# Patient Record
Sex: Male | Born: 1960 | Race: White | Hispanic: No | Marital: Married | Smoking: Current every day smoker
Health system: Southern US, Community
[De-identification: ages and names within clinical notes are randomized; demographics above are authoritative.]

## PROBLEM LIST (undated history)

## (undated) DIAGNOSIS — I2699 Other pulmonary embolism without acute cor pulmonale: Secondary | ICD-10-CM

## (undated) DIAGNOSIS — F1010 Alcohol abuse, uncomplicated: Secondary | ICD-10-CM

## (undated) DIAGNOSIS — IMO0001 Reserved for inherently not codable concepts without codable children: Secondary | ICD-10-CM

## (undated) DIAGNOSIS — I255 Ischemic cardiomyopathy: Secondary | ICD-10-CM

## (undated) DIAGNOSIS — I5022 Chronic systolic (congestive) heart failure: Secondary | ICD-10-CM

## (undated) DIAGNOSIS — I472 Ventricular tachycardia, unspecified: Secondary | ICD-10-CM

## (undated) DIAGNOSIS — I10 Essential (primary) hypertension: Secondary | ICD-10-CM

## (undated) DIAGNOSIS — I2510 Atherosclerotic heart disease of native coronary artery without angina pectoris: Secondary | ICD-10-CM

## (undated) DIAGNOSIS — Z95810 Presence of automatic (implantable) cardiac defibrillator: Secondary | ICD-10-CM

## (undated) DIAGNOSIS — G4733 Obstructive sleep apnea (adult) (pediatric): Secondary | ICD-10-CM

## (undated) DIAGNOSIS — T82198A Other mechanical complication of other cardiac electronic device, initial encounter: Secondary | ICD-10-CM

## (undated) DIAGNOSIS — I252 Old myocardial infarction: Secondary | ICD-10-CM

## (undated) DIAGNOSIS — M1990 Unspecified osteoarthritis, unspecified site: Secondary | ICD-10-CM

## (undated) DIAGNOSIS — I4729 Other ventricular tachycardia: Secondary | ICD-10-CM

## (undated) DIAGNOSIS — K219 Gastro-esophageal reflux disease without esophagitis: Secondary | ICD-10-CM

## (undated) DIAGNOSIS — E871 Hypo-osmolality and hyponatremia: Secondary | ICD-10-CM

## (undated) DIAGNOSIS — F32A Depression, unspecified: Secondary | ICD-10-CM

## (undated) DIAGNOSIS — E785 Hyperlipidemia, unspecified: Secondary | ICD-10-CM

## (undated) DIAGNOSIS — E669 Obesity, unspecified: Secondary | ICD-10-CM

## (undated) DIAGNOSIS — F329 Major depressive disorder, single episode, unspecified: Secondary | ICD-10-CM

## (undated) HISTORY — DX: Obesity, unspecified: E66.9

## (undated) HISTORY — DX: Ischemic cardiomyopathy: I25.5

## (undated) HISTORY — DX: Hyperlipidemia, unspecified: E78.5

## (undated) HISTORY — DX: Obstructive sleep apnea (adult) (pediatric): G47.33

## (undated) HISTORY — PX: KNEE SURGERY: SHX244

## (undated) HISTORY — DX: Other pulmonary embolism without acute cor pulmonale: I26.99

## (undated) HISTORY — DX: Atherosclerotic heart disease of native coronary artery without angina pectoris: I25.10

## (undated) HISTORY — DX: Other mechanical complication of other cardiac electronic device, initial encounter: T82.198A

## (undated) HISTORY — DX: Other ventricular tachycardia: I47.29

## (undated) HISTORY — DX: Presence of automatic (implantable) cardiac defibrillator: Z95.810

## (undated) HISTORY — DX: Ventricular tachycardia, unspecified: I47.20

## (undated) HISTORY — DX: Ventricular tachycardia: I47.2

## (undated) HISTORY — DX: Essential (primary) hypertension: I10

---

## 2000-04-26 ENCOUNTER — Ambulatory Visit (HOSPITAL_BASED_OUTPATIENT_CLINIC_OR_DEPARTMENT_OTHER): Admission: RE | Admit: 2000-04-26 | Discharge: 2000-04-27 | Payer: Self-pay | Admitting: Orthopedic Surgery

## 2002-02-04 ENCOUNTER — Encounter: Payer: Self-pay | Admitting: Gastroenterology

## 2002-02-04 ENCOUNTER — Ambulatory Visit (HOSPITAL_COMMUNITY): Admission: RE | Admit: 2002-02-04 | Discharge: 2002-02-04 | Payer: Self-pay | Admitting: Gastroenterology

## 2002-02-16 ENCOUNTER — Emergency Department (HOSPITAL_COMMUNITY): Admission: EM | Admit: 2002-02-16 | Discharge: 2002-02-16 | Payer: Self-pay | Admitting: *Deleted

## 2002-02-16 ENCOUNTER — Encounter: Payer: Self-pay | Admitting: Emergency Medicine

## 2003-11-04 ENCOUNTER — Ambulatory Visit (HOSPITAL_COMMUNITY): Admission: RE | Admit: 2003-11-04 | Discharge: 2003-11-04 | Payer: Self-pay | Admitting: Gastroenterology

## 2004-02-19 ENCOUNTER — Emergency Department (HOSPITAL_COMMUNITY): Admission: EM | Admit: 2004-02-19 | Discharge: 2004-02-19 | Payer: Self-pay | Admitting: Emergency Medicine

## 2004-11-06 DIAGNOSIS — Z95810 Presence of automatic (implantable) cardiac defibrillator: Secondary | ICD-10-CM

## 2004-11-06 HISTORY — PX: CARDIAC DEFIBRILLATOR PLACEMENT: SHX171

## 2004-11-06 HISTORY — DX: Presence of automatic (implantable) cardiac defibrillator: Z95.810

## 2005-03-04 ENCOUNTER — Ambulatory Visit: Payer: Self-pay | Admitting: Cardiology

## 2005-03-05 ENCOUNTER — Inpatient Hospital Stay (HOSPITAL_COMMUNITY): Admission: EM | Admit: 2005-03-05 | Discharge: 2005-03-17 | Payer: Self-pay | Admitting: Cardiovascular Disease

## 2005-03-05 ENCOUNTER — Ambulatory Visit: Payer: Self-pay | Admitting: Internal Medicine

## 2005-03-05 ENCOUNTER — Ambulatory Visit: Payer: Self-pay | Admitting: Physical Medicine & Rehabilitation

## 2005-03-06 ENCOUNTER — Ambulatory Visit: Payer: Self-pay | Admitting: Pulmonary Disease

## 2005-04-05 ENCOUNTER — Ambulatory Visit: Payer: Self-pay | Admitting: Cardiology

## 2005-04-11 ENCOUNTER — Ambulatory Visit: Payer: Self-pay | Admitting: Cardiology

## 2005-04-12 ENCOUNTER — Ambulatory Visit: Payer: Self-pay | Admitting: Cardiology

## 2005-04-22 ENCOUNTER — Ambulatory Visit: Payer: Self-pay | Admitting: Cardiology

## 2005-04-23 ENCOUNTER — Inpatient Hospital Stay (HOSPITAL_COMMUNITY): Admission: EM | Admit: 2005-04-23 | Discharge: 2005-04-25 | Payer: Self-pay | Admitting: Emergency Medicine

## 2005-05-02 ENCOUNTER — Ambulatory Visit: Payer: Self-pay | Admitting: Cardiology

## 2005-06-08 ENCOUNTER — Ambulatory Visit: Payer: Self-pay | Admitting: Cardiology

## 2005-06-14 ENCOUNTER — Ambulatory Visit: Payer: Self-pay | Admitting: Cardiology

## 2005-06-23 ENCOUNTER — Ambulatory Visit: Payer: Self-pay | Admitting: Cardiology

## 2005-06-23 ENCOUNTER — Inpatient Hospital Stay (HOSPITAL_COMMUNITY): Admission: AD | Admit: 2005-06-23 | Discharge: 2005-06-26 | Payer: Self-pay | Admitting: Cardiology

## 2005-07-03 ENCOUNTER — Ambulatory Visit: Payer: Self-pay | Admitting: Cardiology

## 2005-07-19 ENCOUNTER — Ambulatory Visit: Payer: Self-pay | Admitting: Internal Medicine

## 2005-08-08 ENCOUNTER — Encounter (INDEPENDENT_AMBULATORY_CARE_PROVIDER_SITE_OTHER): Payer: Self-pay | Admitting: Internal Medicine

## 2005-08-08 ENCOUNTER — Ambulatory Visit (HOSPITAL_COMMUNITY): Admission: RE | Admit: 2005-08-08 | Discharge: 2005-08-08 | Payer: Self-pay | Admitting: Internal Medicine

## 2005-08-08 ENCOUNTER — Ambulatory Visit: Payer: Self-pay | Admitting: Internal Medicine

## 2005-08-14 ENCOUNTER — Ambulatory Visit (HOSPITAL_COMMUNITY): Admission: RE | Admit: 2005-08-14 | Discharge: 2005-08-14 | Payer: Self-pay | Admitting: Internal Medicine

## 2005-08-17 ENCOUNTER — Ambulatory Visit (HOSPITAL_COMMUNITY): Admission: RE | Admit: 2005-08-17 | Discharge: 2005-08-17 | Payer: Self-pay | Admitting: Cardiovascular Disease

## 2005-08-28 ENCOUNTER — Ambulatory Visit: Payer: Self-pay | Admitting: Internal Medicine

## 2005-08-28 ENCOUNTER — Observation Stay (HOSPITAL_COMMUNITY): Admission: AD | Admit: 2005-08-28 | Discharge: 2005-08-29 | Payer: Self-pay | Admitting: Internal Medicine

## 2005-09-13 ENCOUNTER — Ambulatory Visit: Payer: Self-pay

## 2005-10-10 ENCOUNTER — Ambulatory Visit: Payer: Self-pay | Admitting: Cardiology

## 2005-11-01 ENCOUNTER — Ambulatory Visit: Payer: Self-pay | Admitting: Cardiology

## 2005-11-08 ENCOUNTER — Ambulatory Visit: Payer: Self-pay | Admitting: Internal Medicine

## 2005-12-19 ENCOUNTER — Ambulatory Visit: Payer: Self-pay | Admitting: Internal Medicine

## 2006-01-02 ENCOUNTER — Ambulatory Visit: Payer: Self-pay | Admitting: Cardiology

## 2006-01-29 ENCOUNTER — Ambulatory Visit: Payer: Self-pay | Admitting: Cardiology

## 2006-03-02 ENCOUNTER — Ambulatory Visit: Payer: Self-pay | Admitting: Cardiology

## 2006-03-19 ENCOUNTER — Ambulatory Visit: Payer: Self-pay | Admitting: Internal Medicine

## 2006-03-20 ENCOUNTER — Ambulatory Visit: Payer: Self-pay | Admitting: Internal Medicine

## 2006-04-03 ENCOUNTER — Ambulatory Visit: Payer: Self-pay | Admitting: Cardiology

## 2006-04-09 ENCOUNTER — Ambulatory Visit: Payer: Self-pay | Admitting: Cardiology

## 2006-04-26 ENCOUNTER — Ambulatory Visit: Payer: Self-pay | Admitting: Cardiology

## 2006-06-04 ENCOUNTER — Ambulatory Visit: Payer: Self-pay | Admitting: Cardiology

## 2006-12-03 ENCOUNTER — Ambulatory Visit: Payer: Self-pay

## 2006-12-03 ENCOUNTER — Ambulatory Visit: Payer: Self-pay | Admitting: Internal Medicine

## 2006-12-24 ENCOUNTER — Ambulatory Visit: Payer: Self-pay | Admitting: Cardiology

## 2007-03-25 ENCOUNTER — Encounter: Payer: Self-pay | Admitting: Cardiology

## 2007-05-03 ENCOUNTER — Ambulatory Visit: Payer: Self-pay | Admitting: Gastroenterology

## 2007-05-07 ENCOUNTER — Encounter: Payer: Self-pay | Admitting: Cardiology

## 2007-05-13 ENCOUNTER — Ambulatory Visit: Payer: Self-pay | Admitting: Gastroenterology

## 2007-05-13 ENCOUNTER — Ambulatory Visit (HOSPITAL_COMMUNITY): Admission: RE | Admit: 2007-05-13 | Discharge: 2007-05-13 | Payer: Self-pay | Admitting: Gastroenterology

## 2007-05-15 ENCOUNTER — Encounter: Payer: Self-pay | Admitting: Cardiology

## 2007-07-11 ENCOUNTER — Ambulatory Visit: Payer: Self-pay | Admitting: Gastroenterology

## 2007-07-24 ENCOUNTER — Encounter: Payer: Self-pay | Admitting: Cardiology

## 2007-07-24 ENCOUNTER — Ambulatory Visit: Payer: Self-pay | Admitting: Physician Assistant

## 2007-07-26 ENCOUNTER — Encounter: Payer: Self-pay | Admitting: Cardiology

## 2007-08-13 ENCOUNTER — Ambulatory Visit: Payer: Self-pay | Admitting: Cardiology

## 2007-08-15 ENCOUNTER — Ambulatory Visit: Payer: Self-pay | Admitting: Internal Medicine

## 2007-08-22 ENCOUNTER — Ambulatory Visit (HOSPITAL_COMMUNITY): Payer: Self-pay | Admitting: Psychology

## 2007-10-21 ENCOUNTER — Ambulatory Visit (HOSPITAL_COMMUNITY): Payer: Self-pay | Admitting: Psychology

## 2007-11-06 ENCOUNTER — Ambulatory Visit (HOSPITAL_COMMUNITY): Payer: Self-pay | Admitting: Psychology

## 2007-11-08 ENCOUNTER — Emergency Department (HOSPITAL_COMMUNITY): Admission: EM | Admit: 2007-11-08 | Discharge: 2007-11-09 | Payer: Self-pay | Admitting: Emergency Medicine

## 2007-11-19 ENCOUNTER — Ambulatory Visit: Payer: Self-pay | Admitting: Internal Medicine

## 2007-12-06 ENCOUNTER — Ambulatory Visit (HOSPITAL_COMMUNITY): Payer: Self-pay | Admitting: Psychology

## 2008-01-22 ENCOUNTER — Encounter: Payer: Self-pay | Admitting: Cardiology

## 2008-02-13 ENCOUNTER — Ambulatory Visit: Payer: Self-pay | Admitting: Internal Medicine

## 2008-03-25 ENCOUNTER — Ambulatory Visit (HOSPITAL_COMMUNITY): Payer: Self-pay | Admitting: Psychology

## 2008-04-14 ENCOUNTER — Ambulatory Visit (HOSPITAL_COMMUNITY): Payer: Self-pay | Admitting: Psychology

## 2008-05-20 ENCOUNTER — Ambulatory Visit (HOSPITAL_COMMUNITY): Payer: Self-pay | Admitting: Psychology

## 2008-09-01 ENCOUNTER — Ambulatory Visit: Payer: Self-pay | Admitting: Internal Medicine

## 2009-02-02 ENCOUNTER — Encounter: Payer: Self-pay | Admitting: Internal Medicine

## 2009-04-14 ENCOUNTER — Encounter: Payer: Self-pay | Admitting: Cardiology

## 2009-05-26 DIAGNOSIS — I10 Essential (primary) hypertension: Secondary | ICD-10-CM | POA: Insufficient documentation

## 2009-05-26 DIAGNOSIS — I5022 Chronic systolic (congestive) heart failure: Secondary | ICD-10-CM | POA: Insufficient documentation

## 2009-05-26 DIAGNOSIS — E663 Overweight: Secondary | ICD-10-CM | POA: Insufficient documentation

## 2009-05-26 DIAGNOSIS — I429 Cardiomyopathy, unspecified: Secondary | ICD-10-CM | POA: Insufficient documentation

## 2009-05-26 DIAGNOSIS — I2510 Atherosclerotic heart disease of native coronary artery without angina pectoris: Secondary | ICD-10-CM | POA: Insufficient documentation

## 2009-05-26 DIAGNOSIS — E785 Hyperlipidemia, unspecified: Secondary | ICD-10-CM | POA: Insufficient documentation

## 2009-06-15 ENCOUNTER — Ambulatory Visit: Payer: Self-pay | Admitting: Internal Medicine

## 2009-09-14 ENCOUNTER — Ambulatory Visit: Payer: Self-pay | Admitting: Internal Medicine

## 2009-09-20 ENCOUNTER — Telehealth: Payer: Self-pay | Admitting: Internal Medicine

## 2009-10-13 ENCOUNTER — Encounter: Payer: Self-pay | Admitting: Internal Medicine

## 2009-10-13 ENCOUNTER — Ambulatory Visit: Payer: Self-pay | Admitting: Cardiology

## 2009-10-14 ENCOUNTER — Inpatient Hospital Stay (HOSPITAL_COMMUNITY): Admission: EM | Admit: 2009-10-14 | Discharge: 2009-10-15 | Payer: Self-pay | Admitting: Emergency Medicine

## 2009-10-14 ENCOUNTER — Encounter: Payer: Self-pay | Admitting: Cardiology

## 2009-10-15 ENCOUNTER — Telehealth (INDEPENDENT_AMBULATORY_CARE_PROVIDER_SITE_OTHER): Payer: Self-pay | Admitting: *Deleted

## 2009-12-14 ENCOUNTER — Ambulatory Visit: Payer: Self-pay | Admitting: Internal Medicine

## 2009-12-15 ENCOUNTER — Encounter: Payer: Self-pay | Admitting: Internal Medicine

## 2009-12-31 ENCOUNTER — Encounter: Payer: Self-pay | Admitting: Internal Medicine

## 2010-03-14 ENCOUNTER — Encounter: Payer: Self-pay | Admitting: Internal Medicine

## 2010-03-15 ENCOUNTER — Telehealth (INDEPENDENT_AMBULATORY_CARE_PROVIDER_SITE_OTHER): Payer: Self-pay | Admitting: *Deleted

## 2010-03-15 ENCOUNTER — Ambulatory Visit: Payer: Self-pay | Admitting: Internal Medicine

## 2010-03-22 ENCOUNTER — Encounter: Payer: Self-pay | Admitting: Internal Medicine

## 2010-07-05 ENCOUNTER — Ambulatory Visit: Payer: Self-pay | Admitting: Internal Medicine

## 2010-10-06 ENCOUNTER — Ambulatory Visit: Payer: Self-pay | Admitting: Internal Medicine

## 2010-11-16 ENCOUNTER — Encounter (INDEPENDENT_AMBULATORY_CARE_PROVIDER_SITE_OTHER): Payer: Self-pay | Admitting: *Deleted

## 2010-12-08 NOTE — Letter (Signed)
Summary: Remote Device Check  [employer], Main Office  [address]   [place], [place] [address]   Phone: [phone]  Fax: [phone]     December 31, 2009 MRN: [id number]   Stephen [name] [address] [place], [place]  [address]   Dear [name],   Your remote transmission was recieved and reviewed by your physician.  All diagnostics were within normal limits for you.  __X___Your next transmission is scheduled for: Mar 15, 2010.  Please transmit at any time this day.  If you have a wireless device your transmission will be sent automatically.      Sincerely,  Proofreader

## 2010-12-08 NOTE — Cardiovascular Report (Signed)
Summary: Office Visit Remote   Office Visit Remote   Imported By: Roderic Ovens 03/23/2010 10:55:29  _____________________________________________________________________  External Attachment:    Type:   Image     Comment:   External Document

## 2010-12-08 NOTE — Assessment & Plan Note (Addendum)
Summary: defib check.mdt.amber  Medications Added WARFARIN SODIUM 5 MG TABS (WARFARIN SODIUM) UAD.  2.5 daily except Thursday 5 mg      Allergies Added: NKDA  Referring Provider:  Donnella Bi Primary Provider:  Ardeen Garland  CC:  defib check.  History of Present Illness: Stephen [name] is seen in followup for ICD implanted initially for primary prevention in the setting of ischemic heart disease.He has prior stenting  of his RCA.He has had appropriate therapy for VT./VF Iin July of 08. There've been no intercurrent episodes.   He has chronic stable shortness of breath and chronic stable nonexertional chest pain. He's had no peripheral edema.  He comes in today with a request from Dr. Mckinley Jewel for orthopedic clearance for possible arthroscopy.    Current Medications (verified): 1)  Crestor 5 Mg Tabs (Rosuvastatin Calcium) .... Once Daily 2)  Furosemide 20 Mg Tabs (Furosemide) .... Once Daily 3)  Enalapril Maleate 5 Mg Tabs (Enalapril Maleate) .... Take One Tablet Once Daily 4)  Omeprazole 40 Mg Cpdr (Omeprazole) .... Once Daily 5)  Diazepam 2 Mg Tabs (Diazepam) .... Take 2 Tabs Once Daily 6)  Bisoprolol Fumarate 5 Mg Tabs (Bisoprolol Fumarate) .... Once Daily 7)  Depakote Er 500 Mg Xr24h-Tab (Divalproex Sodium) .... Take 2 Once Daily 8)  Nasonex 50 Mcg/act Susp (Mometasone Furoate) .Marland Kitchen.. 1 Spray Each Nostril Once Daily As Needed 9)  Nitrostat 0.4 Mg Subl (Nitroglycerin) .... Dissolve One Tablet Under Tongue Every 5 Minutes As Needed Chest Pain.  May Repeat 3 Times 10)  Warfarin Sodium 5 Mg Tabs (Warfarin Sodium) .... Uad.  2.5 Daily Except Thursday 5 Mg  Allergies (verified): No Known Drug Allergies  Past History:  Past Medical History: Last updated: 06/12/2009 OVERWEIGHT/OBESITY (ICD-278.02) HYPERTENSION, UNSPECIFIED (ICD-401.9) HYPERLIPIDEMIA-MIXED (ICD-272.4) SYSTOLIC HEART FAILURE, CHRONIC (ICD-428.22) CAD, NATIVE VESSEL (ICD-414.01) ICD - IN SITU (ICD-V45.02)--Medtronic  Maximo 7232   2006 CARDIOMYOPATHY, ISCHEMIC (ICD-414.8) CHEST PAIN-UNSPECIFIED (ICD-786.50) Obstructive sleep apnea COPD  Past Surgical History: Last updated: 06/12/2009 ICD implantation-Medtronic Maximo 3875--6433  Social History: Last updated: 05/26/2009 Married  Tobacco Use - Yes. - 2 packs daily Alcohol Use - yes - socially Drug Use - yes - marijuana  Vital Signs:  Patient profile:   50 year old male Height:      72 inches Weight:      290 pounds BMI:     39.47 Pulse rate:   86 / minute Pulse rhythm:   regular BP sitting:   148 / 109  (left arm) Cuff size:   regular  Vitals Entered By: Judithe Modest CMA (July 05, 2010 10:37 AM)  Physical Exam  General:  The patient was alert and oriented in no acute distress. HEENT Normal.  Neck veins were flat, carotids were brisk.  Lungs were clear.  Heart sounds were regular without murmurs or gallops.  Abdomen was soft with active bowel sounds. There is no clubbing cyanosis; trace edema Skin Warm and dry     ICD Specifications Following MD:  Sherryl Manges, MD     ICD Vendor:  Medtronic     ICD Model Number:  7232     ICD Serial Number:  [id number] ICD DOI:  08/28/2005     ICD Implanting MD:  Sherryl Manges, MD  Lead 1:    Location: RV     DOI: 08/28/2005     Model #: 6063     Serial #: [id number]     Status: active  Indications::  ICM   ICD Follow  Up Remote Check?  No Battery Voltage:  3.06 V     Charge Time:  8.56 seconds     Underlying rhythm:  SR ICD Dependent:  No       ICD Device Measurements Right Ventricle:  Amplitude: 12.5 mV, Impedance: 456 ohms, Threshold: 1.0 V at 0.2 msec Shock Impedance: 45 ohms   Episodes Shock:  0     ATP:  0     Nonsustained:  1     Ventricular Pacing:  <0.1%  Brady Parameters Mode VVI     Lower Rate Limit:  40      Tachy Zones VF:  214     VT:  250     VT1:  188     Next Remote Date:  10/06/2010     Next Cardiology Appt Due:  06/07/2011 Tech Comments:  No parameter changes.   Device function normal.  6949 lead stable,  SIC 0.  [facility]link transmissions every 3 months.  ROV 1 year with Dr. Graciela Husbands. Altha Harm, LPN  July 05, 2010 10:52 AM   Impression & Recommendations:  Problem # 1:  [facility] [phone] LEAD  LIA ON (ICD-996.04) we have reviewed again the importance of this 6949 alert. We have given him the letter and the magnet instructions. He verbalizes understanding.  Problem # 2:  CARDIOMYOPATHY, ISCHEMIC (ICD-414.8) he had no intercurrent problems with chest pain. in the event that he would like to pursue arthroscopy, I told him that he would need to undergo Myoview scanning as it has been more than 5 years since his last catheterization without intercurrent scan. The following medications were removed from the medication list:    Aspirin 81 Mg Tabs (Aspirin) ..... Once daily His updated medication list for this problem includes:    Furosemide 20 Mg Tabs (Furosemide) ..... Once daily    Enalapril Maleate 5 Mg Tabs (Enalapril maleate) .Marland Kitchen... Take one tablet once daily    Bisoprolol Fumarate 5 Mg Tabs (Bisoprolol fumarate) ..... Once daily    Nitrostat 0.4 Mg Subl (Nitroglycerin) .Marland Kitchen... Dissolve one tablet under tongue every 5 minutes as needed chest pain.  may repeat 3 times    Warfarin Sodium 5 Mg Tabs (Warfarin sodium) ..... Uad.  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 Once daily His updated medication list for this problem includes:    Enalapril Maleate 5 Mg Tabs (Enalapril maleate) .Marland Kitchen... Take one tablet once  daily    Bisoprolol Fumarate 5 Mg Tabs (Bisoprolol fumarate) ..... Once daily    Nitrostat 0.4 Mg Subl (Nitroglycerin) .Marland Kitchen... Dissolve one tablet under tongue every 5 minutes as needed chest pain.  may repeat 3 times    Warfarin Sodium 5 Mg Tabs (Warfarin sodium) ..... Uad.  2.5 daily except thursday 5 mg  Patient Instructions: 1)  Your physician recommends that you schedule a follow-up appointment in: YEAR WITH DR Graciela Husbands 2)  Your physician recommends that you continue on your current medications as directed. Please refer to the Current Medication list given to you today.  Appended Document: defib check.mdt.amber pts device interrogation  demonstrated fast haeart rate/  please have him increase bisoprolol to 10 from 5 thanks steve  Appended Document: defib check.mdt.amber PT AWARE./CY

## 2010-12-08 NOTE — Cardiovascular Report (Signed)
Summary: Office Visit Remote   Office Visit Remote   Imported By: Roderic Ovens 11/18/2010 11:26:25  _____________________________________________________________________  External Attachment:    Type:   Image     Comment:   External Document

## 2010-12-08 NOTE — Cardiovascular Report (Signed)
Summary: Office Visit Remote   Office Visit Remote   Imported By: Roderic Ovens 12/31/2009 13:53:35  _____________________________________________________________________  External Attachment:    Type:   Image     Comment:   External Document

## 2010-12-08 NOTE — Progress Notes (Signed)
Summary: Pt checking on transmission   Phone Note Call from Patient Call back at Home Phone [phone]   Caller: Patient Summary of Call: Pt calling to check and see if transmission went through Initial call taken by: Judie Grieve,  Mar 15, 2010 3:26 PM  Follow-up for Phone Call        left message for the patient, his transmissions did go through and he'll recieve a letter after it's read by the MD. Follow-up by: Altha Harm, LPN,  Mar 16, 2010 8:11 AM

## 2010-12-08 NOTE — Letter (Signed)
Summary: Remote Device Check  [facility] [employer], Main Office  [address]   [place], [place] [address]   Phone: [phone]  Fax: [phone]     November 16, 2010 MRN: [id number]   KINGSLEE MAIRENA [address] [place], [place]  [address]   Dear Mr. DOLLINS,   Your remote transmission was recieved and reviewed by your physician.  All diagnostics were within normal limits for you.  __X___Your next transmission is scheduled for:   01-05-2011.  Please transmit at any time this day.  If you have a wireless device your transmission will be sent automatically.   Sincerely,  Vella Kohler

## 2010-12-08 NOTE — Letter (Signed)
Summary: Remote Device Check  [facility], Main Office  [address]   [place], [place] [address]   Phone: [phone]  Fax: [phone]     Mar 22, 2010 MRN: [id number]   Stephen [name] [address] [place], [place]  [address]   Dear [name],   Your remote transmission was recieved and reviewed by your physician.  All diagnostics were within normal limits for you.  __X____Your next office visit is scheduled for:  August 2011. Please call our office to schedule an appointment.    Sincerely,  Vella Kohler

## 2011-01-05 ENCOUNTER — Encounter: Payer: Self-pay | Admitting: Internal Medicine

## 2011-01-05 ENCOUNTER — Encounter (INDEPENDENT_AMBULATORY_CARE_PROVIDER_SITE_OTHER): Payer: Medicare Other

## 2011-01-05 DIAGNOSIS — I428 Other cardiomyopathies: Secondary | ICD-10-CM

## 2011-01-24 NOTE — Cardiovascular Report (Signed)
Summary: Office Visit   Office Visit   Imported By: Roderic Ovens 01/17/2011 16:07:09  _____________________________________________________________________  External Attachment:    Type:   Image     Comment:   External Document

## 2011-02-07 LAB — CBC
HCT: 37.3 % — ABNORMAL LOW (ref 39.0–52.0)
MCHC: 32.1 g/dL (ref 30.0–36.0)
MCV: 79.1 fL (ref 78.0–100.0)
MCV: 79.3 fL (ref 78.0–100.0)
Platelets: 276 10*3/uL (ref 150–400)
RBC: 4.59 MIL/uL (ref 4.22–5.81)
RBC: 4.71 MIL/uL (ref 4.22–5.81)
RDW: 17.7 % — ABNORMAL HIGH (ref 11.5–15.5)
WBC: 12.2 10*3/uL — ABNORMAL HIGH (ref 4.0–10.5)
WBC: 12.4 10*3/uL — ABNORMAL HIGH (ref 4.0–10.5)

## 2011-02-07 LAB — HEPATIC FUNCTION PANEL
ALT: 27 U/L (ref 0–53)
AST: 33 U/L (ref 0–37)
Albumin: 3.9 g/dL (ref 3.5–5.2)
Alkaline Phosphatase: 77 U/L (ref 39–117)
Alkaline Phosphatase: 78 U/L (ref 39–117)
Bilirubin, Direct: 0.1 mg/dL (ref 0.0–0.3)
Bilirubin, Direct: 0.2 mg/dL (ref 0.0–0.3)
Indirect Bilirubin: 0.6 mg/dL (ref 0.3–0.9)
Indirect Bilirubin: 0.6 mg/dL (ref 0.3–0.9)
Total Bilirubin: 0.8 mg/dL (ref 0.3–1.2)
Total Protein: 7.4 g/dL (ref 6.0–8.3)

## 2011-02-07 LAB — BASIC METABOLIC PANEL
BUN: 11 mg/dL (ref 6–23)
Calcium: 9.1 mg/dL (ref 8.4–10.5)
Chloride: 97 mEq/L (ref 96–112)
Creatinine, Ser: 1.09 mg/dL (ref 0.4–1.5)
GFR calc Af Amer: >60 mL/min (ref >60)
Potassium: 3.8 mEq/L (ref 3.5–5.1)
Potassium: 3.9 mEq/L (ref 3.5–5.1)
Sodium: 136 mEq/L (ref 135–145)

## 2011-02-07 LAB — HEMOCCULT GUIAC POC 1CARD (OFFICE): Fecal Occult Bld: NEGATIVE

## 2011-02-07 LAB — DIFFERENTIAL
Basophils Absolute: 0.1 10*3/uL (ref 0.0–0.1)
Basophils Relative: 1 % (ref 0–1)
Eosinophils Relative: 3 % (ref 0–5)
Lymphocytes Relative: 17 % (ref 12–46)
Lymphocytes Relative: 23 % (ref 12–46)
Monocytes Absolute: 1.2 10*3/uL — ABNORMAL HIGH (ref 0.1–1.0)
Monocytes Relative: 13 % — ABNORMAL HIGH (ref 3–12)
Neutro Abs: 7.4 10*3/uL (ref 1.7–7.7)
Neutrophils Relative %: 70 % (ref 43–77)

## 2011-02-07 LAB — ANTIPHOSPHOLIPID SYNDROME EVAL, BLD
DRVVT: 46.1 secs — ABNORMAL HIGH (ref 34.7–40.5)
Drvvt confirmation: 1.16 Ratio (ref <1.18)
Lupus Anticoagulant: NOT DETECTED
PTTLA 4:1 Mix: 48.8 secs (ref 36.3–48.8)
dRVVT Incubated 1:1 Mix: 41.1 secs (ref 36.1–47.0)

## 2011-02-07 LAB — PROTIME-INR
INR: 1.08 (ref 0.00–1.49)
Prothrombin Time: 13.8 seconds (ref 11.6–15.2)
Prothrombin Time: 16.2 seconds — ABNORMAL HIGH (ref 11.6–15.2)

## 2011-02-07 LAB — CARDIAC PANEL(CRET KIN+CKTOT+MB+TROPI)
Relative Index: 1.4 (ref 0.0–2.5)
Total CK: 170 U/L (ref 7–232)
Troponin I: 0.02 ng/mL (ref 0.00–0.06)

## 2011-02-07 LAB — LIPID PANEL
Cholesterol: 114 mg/dL (ref 0–200)
LDL Cholesterol: 32 mg/dL (ref 0–99)
Total CHOL/HDL Ratio: 2.2 RATIO
Triglycerides: 154 mg/dL — ABNORMAL HIGH (ref <150)
VLDL: 31 mg/dL (ref 0–40)

## 2011-02-07 LAB — IRON AND TIBC
Saturation Ratios: 22 % (ref 20–55)
TIBC: 496 ug/dL — ABNORMAL HIGH (ref 215–435)

## 2011-02-07 LAB — POCT CARDIAC MARKERS
Myoglobin, poc: 159 ng/mL (ref 12–200)
Troponin i, poc: <0.05 ng/mL (ref 0.00–0.09)

## 2011-02-07 LAB — D-DIMER, QUANTITATIVE: D-Dimer, Quant: 0.57 ug/mL-FEU — ABNORMAL HIGH (ref 0.00–0.48)

## 2011-02-07 LAB — APTT: aPTT: 28 seconds (ref 24–37)

## 2011-02-07 LAB — PROTEIN S, TOTAL: Protein S Ag, Total: 117 % (ref 70–140)

## 2011-03-21 NOTE — Assessment & Plan Note (Signed)
NAME:  TRAYDEN, BRANDY                CHART#:  [id number]   DATE:  07/11/2007                       DOB:  08/09/1961   CHIEF COMPLAINT:  Follow up colonoscopy/refill on omeprazole/chronic  GERD.   SUBJECTIVE:  The patient is a 50 year old Caucasian male who underwent  colonoscopy by Dr. Kassie Mends on 05/13/07.  He was found to have a  tortuous colon which would not allow successful intubation of the cecum  after multiple attempts at repositioning pressure and straightening and  advancing the scope.  He had rare sigmoid diverticulosis and external  hemorrhoids.  He was scheduled to have a colonoscopy in 3-6 months with  an over tube and fluoroscopy due to the fact that it was felt he may  have a sessile cecal polyp interfering with exam.  He was given Anusol  HC b.i.d. for 14 days for his rectal bleeding; this has resolved.  Today  he tells me his GERD is well controlled; he is on b.i.d. omeprazole and  has been although our records indicate he was receiving once daily  dosage.  He tells me he has been on this for quite sometime.  He is no  longer on Reglan which we discontinued previously earlier this year.  He  complains of mask like facial pain along with severe headache, nasal  congestion he has had for about 4-5 days.  He denies any significant  fever.  He feels an intense pressure behind his eyes.  He has been  blowing his nose with yellowish to clear exudates.  He is requesting  antibiotics today.  His appointment for [facility] is in December of this year.   CURRENT MEDICATIONS:  See the list from 07/11/07.   ALLERGIES:  HALDOL, ATIVAN, AND HE HAS HAD A REACTION TO SOME SURGICAL  MEDICATIONS BUT DOES NOT GIVE DETAIL ON THIS.  HE IS ALSO ALLERGIC TO  OXYCONTIN.   OBJECTIVE:  VITAL SIGNS:  Weight 283 pounds, height 72 inches,  temperature 98.1, blood pressure 120/82, and pulse 88.  GENERAL:  The patient is an morbidly obese Caucasian male  who is alert,  oriented, pleasant, and cooperative, in no acute distress.  HEENT:  Sclerae clear, nonicteric, conjunctivae pink.  He has frontal  and maxillary sinus tenderness as well as ethmoid tenderness.  Nasal  turbinates with erythema.  Posterior pharynx with erythema and without  exudates or edema.  NECK:  Supple without any masses or thyromegaly.  CHEST:  Heart regular rate and rhythm, normal S1, S2.  ABDOMEN:  Protuberant with positive bowel sounds times 4.  No bruits  auscultated.  Soft, nontender, nondistended without palpable mass or  hepatosplenomegaly.  No rebound, tenderness or guarding although limited  given the patient's body habitus.  EXTREMITIES:  Without clubbing or edema bilaterally.   ASSESSMENT:  1. The patient is a 50 year old Caucasian male with what appears to be      acute sinusitis.  Due to the fact that he has been examined today      and tells me it would be difficult to get an appointment with Dr.      Kathi Der office today will go ahead and treat him for a sinusitis.      He is however instructed that if his symptoms persist he is going  to need to follow up with Dr. Christell Constant as well as if he has any      problems.  2. His chronic GERD is well controlled on b.i.d. PPI.  3. Incomplete colonoscopy scheduled for complete colonoscopy at [facility] with over tube and      fluoroscopy in December 2008.  He also has a defibrillator.   PLAN:  1. Refill on omeprazole 20 mg b.i.d., #60 with 5 refills.  2. Z-Pak to take as directed.  He is to call Dr. Christell Constant if he has any      further problems with his sinusitis.  3. Follow up with [facility] in      December of this year.  4. Follow up with Dr. Cira Servant in January 2009 or sooner if he has any      problems.       Lorenza Burton, N.P.  Electronically Signed     Kassie Mends, M.D.  Electronically Signed    KJ/MEDQ  D:  07/11/2007   T:  07/11/2007  Job:  [id number]   cc:   Ernestina Penna, M.D.

## 2011-03-21 NOTE — H&P (Signed)
NAME:  Stephen [name], Stephen [name] NO.:  [id number]   MEDICAL RECORD NO.:  [id number]          PATIENT TYPE:  AMB   LOCATION:  DAY                           FACILITY:  [facility]   PHYSICIAN:  Kassie Mends, M.D.      DATE OF BIRTH:  [birth year]   DATE OF ADMISSION:  05/03/2007  DATE OF DISCHARGE:  LH                              HISTORY & PHYSICAL   CHIEF COMPLAINT:  Hematochezia/followup GERD.   HISTORY OF PRESENT ILLNESS:  Stephen [name] is a 50 year old Caucasian male  with a history of chronic GERD.  He is here for followup today.  He  tells me he is doing well on omeprazole 20 mg daily.  He discontinued  his Reglan, which he was taking 2.5 mg q.i.d.  He discontinued it about  one or two weeks ago.  He has done well off of this medication.  His  weight is steadily increasing.  He denies nausea, vomiting, heartburn,  or indigestion.  He only has rare transient epigastric pain.  He tells  me within the last 3-4 months, he has noticed moderate to large amounts  of bright red blood on the toilet paper with wiping.  He denies any  history of diarrhea or constipation.  He is having some proctalgia.  He  has been self-treating for hemorrhoids.  There is no known family  history of colorectal carcinoma.   PAST MEDICAL/SURGICAL HISTORY:  1. Coronary disease, status post MI in April 2006, status post PTCA.  2. Left knee replacement, 2001.  3. He has chronic GERD with EGD by Dr. Karilyn Cota on August 08, 2005.      There was no evidence of complicated GERD.  Bravo device was placed      at that time.  He had a small antral polyp and antral gastritis.      He has a small antral fundic gland polyp and antral gastritis.  4. Ischemic cardiomyopathy with a decreased ejection fraction.  He had      a defibrillator placed back in 2006.  5. He has depression.  6. Congestive heart failure.  7. Hyperlipidemia.  8. COPD.  9. Obesity.  10.Obstructive sleep apnea.   CURRENT MEDICATIONS:  1. Aspirin 325  mg daily.  2. Plavix 75 mg daily.  3. Lasix 20 mg daily.  4. Enalapril 5 mg b.i.d.  5. Crestor once every other day.   ALLERGIES:  1. HALDOL.  2. ATIVAN.  3. OXYCONTIN.  He has had a reaction to some medicines he was given after surgery but  does not have details on this.   FAMILY HISTORY:  Noncontributory.   SOCIAL HISTORY:  Stephen [name] is married.  He has two healthy children.  He has a  significant tobacco use history.  He does have history of frequent  alcohol use and remote marijuana use.  No recent drug use.   REVIEW OF SYSTEMS:  See HPI, otherwise unremarkable.   PHYSICAL EXAMINATION:  VITAL SIGNS:  Weight 282 pounds, height 72  inches, temperature 98 degrees, blood pressure 120/82, pulse 72.  GENERAL:  Stephen [name]  is a 52 year old, obese, Caucasian male who is  alert, oriented, pleasant, cooperative in no acute distress.  HEENT: clear, nonicteric.  Conjunctivae pink.  Oropharynx pink and moist  without lesions.  HEART:  Regular rate and rhythm, normal S1 S2 without murmurs, clicks,  rubs, or gallops.  He has a defibrillator in place.  LUNGS:  Clear to auscultation bilaterally.  ABDOMEN:  Positive bowel sounds x4.  No bruits auscultated.  Soft,  nontender, nondistended without palpable mass, hepatosplenomegaly.  No  rebound tenderness or guarding.  EXTREMITIES:  Exam is limited given the patient's body habitus.  RECTAL:  No external lesions visualized.  Good sphincter tone.  No  internal mass.  A small amount of Hemoccult negative Janeway stool  obtained from vault.  He had significant tenderness on exam, although no  lesions were palpated.   IMPRESSION:  1. Stephen [name] is a 56 year old Caucasian male with history of chronic      gastroesophageal reflux disease, well-controlled on PPI.  2. He also has noted intermittent large to moderate volume      hematochezia on the toilet paper, which I suspect could be related      to fissure/internal hemorrhoids not appreciated on  exam,      diverticula, or colorectal carcinoma.   PLAN:  1. We will request recent labs from Dr. Kathi Der office.  2. Omeprazole 20 mg daily, #30, with 11 refills.  3. Suggested significant weight loss.  4. Colonoscopy with Dr. Cira Servant in the near future.  I have discussed      this procedure including risks and benefits which include, but are      not limited to bleeding, infection, perforation, drug reaction.  He      agrees with plan.  Consent will be obtained.  He is to hold his      Plavix and aspirin for four days prior to the procedure.      Lorenza Burton, N.P.      Kassie Mends, M.D.  Electronically Signed   KJ/MEDQ  D:  05/03/2007  T:  05/03/2007  Job:  [id number]

## 2011-03-21 NOTE — Assessment & Plan Note (Signed)
[facility]                          [facility] OFFICE NOTE   Stephen [name], Stephen [name]                      MRN:          [id number]  DATE:07/24/2007                            DOB:          08/15/1961    REFERRING PHYSICIAN:  Dr. Morrie Sheldon and Dr. Daphine Deutscher at the [facility].   HISTORY OF PRESENT ILLNESS:  The patient is a 50 year old male with a  history of coronary artery disease status post inferior wall myocardial  infarction treated with overlapping tandem bare-metal stents in April of  2006. The patient had a followup catheterization with nonobstructive  disease. The ejection fraction was 40-45%. The patient had not had any  recent stress testing done. He has had no heart failure symptoms. He  does report atypical chest pain across the precordium which he started  after his defibrillator implant. On physical exam, however, there is no  obvious problems related to his defibrillator. There is no swelling or  erythema in particular. He follows with Dr. Graciela Husbands, and Dr. Graciela Husbands is to  see him next week.   From a cardiovascular standpoint, the patient is actually doing quite  well. He has no chest pain, shortness of breath, orthopnea, PND.  Unfortunately, he has gained weight, and he does not adhere well to a  low-carbohydrate diet. The patient also looks rather anxious and upset  today because he had to wait slightly longer in the office today. His  blood pressure was elevated, but even after we kept the patient for a  little while in the office, his blood pressure, particularly diastolic,  remained high. I told him that he has worsening hypertension that needs  our attention and treatment.   MEDICATIONS:  1. Aspirin 325 mg p.o. daily.  2. Enalapril 2.5 mg p.o. b.i.d.  3. Lasix 20 mg p.o. daily.  4. Crestor 10 mg every other day.  5. Bisoprolol 5 mg p.o. daily.   PHYSICAL EXAMINATION:  VITAL SIGNS:  Blood pressure is 131/89 and  137/92  on repeat. Heart rate is 82, weight is 278 pounds.  GENERAL:  Overweight white male in no distress.  HEENT:  Normal.  NECK EXAM:  Normal carotid upstroke. Normal carotid bruits.  LUNGS:  Clear breath sounds bilaterally. No wheezing.  HEART:  Regular rate and rhythm. Normal S1 and S2. No murmurs, rubs, or  gallops.  ABDOMEN:  Soft, nontender. No rebound or guarding. Good bowel sounds.  EXTREMITY EXAM:  No cyanosis, clubbing or edema.  NEUROLOGICAL:  The patient is alert and oriented and grossly nonfocal.   PROBLEM LIST:  1. Coronary artery disease with ischemic cardiomyopathy.      a.     Ejection fraction 40-45% on echocardiogram June 2007.      b.     Status post __________ MI with overlapping and tandem bare-       metal stents April 2006.      c.     Patent stent with residual nonobstructive disease August       2006.      d.     History  of ventricular fibrillation associated with anterior       wall myocardial infarction April 2006.      e.     Atypical chest pain.  2. Status post Medtronic single chamber implantable cardioverter      defibrillator by Dr. Graciela Husbands October 2006.  3. Systolic heart failure, [place] Heart Association Class II.  4. Obstructive sleep apnea on CPAP.  5. Chronic obstructive pulmonary disease.  6. Dyslipidemia.  7. Hypertension.  8. History of alcohol abuse.   PLAN:  1. I have told the patient that this chest pain is atypical. I do not      think that this is ischemia, but he has not had a stress test in      some time. We will plan on adenosine Cardiolite in the next couple      of days.  2. I have told the patient also that if his blood pressure remains      elevated he may need aggressive treatment for this. I have      increased his enalapril to 5 mg p.o. b.i.d.  3. I have also given the patient guidelines from up to date regarding      dietary modification, both from the perspective of      hypercholesterolemia, hypertension and the  fact that he is      overweight.  4. The patient had lipid panel drawn at [facility] and will follow up with this practice in regards to      adjustments of his statin drug therapy.     Learta Codding, MD,FACC  Electronically Signed    GED/MedQ  DD: 07/24/2007  DT: 07/24/2007  Job #: [id number]   cc:   Alfredia Client, MD  Paulene Floor, NP

## 2011-03-21 NOTE — Op Note (Signed)
NAME:  Stephen [name], Stephen [name] NO.:  [id number]   MEDICAL RECORD NO.:  [id number]          PATIENT TYPE:  AMB   LOCATION:  DAY                           FACILITY:  [facility]   PHYSICIAN:  Kassie Mends, M.D.      DATE OF BIRTH:  [birth year]   DATE OF PROCEDURE:  05/13/2007  DATE OF DISCHARGE:                               OPERATIVE REPORT   PROCEDURE:  Colonoscopy.   INDICATIONS FOR PROCEDURE:  Stephen [name] is a 50 year old male who presents  with rectal bleeding.   FINDINGS:  1. Tortuous colon which would not allow successful intubation of the      cecum after multiple attempts at repositioning, pressure and      straightening and advancing the scope.  Otherwise, no polyps,      masses, inflammatory changes or AVM's seen.  2. Rare sigmoid diverticulosis.  3. Normal retroflexed view of the rectum.  External hemorrhoids seen      on rectal exam.   RECOMMENDATIONS:  1. Stephen [name] should have a colonoscopy in 3-6 months at [facility] with an overtube and fluoroscopy.  I believe he has a      sessile cecal polyp.  2. He should follow a high fiber diet.  He is given a handout on high      fiber diet, diverticulosis and hemorrhoids.  3. He may resume his aspirin and his Plavix until his colonoscopy is      scheduled.  4. Anusol HC pr BID for 14 days. OPV with [facility] in one month to      reassess his rectal bleeding.   MEDICATIONS:  1. Demerol 100 mg IV.  2. Versed 9 mg IV.   PROCEDURE TECHNIQUE:  Physical examination was performed and informed  consent was obtained from this patient after explaining the benefits,  risks and alternatives to the procedure.  A magnet was applied to his  defibrillator per the recommendations of Stephen [name].  The patient was  connected to the monitor and placed in the left lateral position.  Continuous oxygen was provided by nasal cannula and IV medicine  administered through an indwelling cannula.  After the administration of  sedation and rectal exam the scope was advanced under direct  visualization to the ileocecal valve.  The scope was removed slowly by  carefully examining the color, texture, anatomy and integrity of the  mucosa on the way out.  The patient was recovered in endoscopy and  discharged home in satisfactory condition.      Kassie Mends, M.D.  Electronically Signed     SM/MEDQ  D:  05/13/2007  T:  05/13/2007  Job:  [id number]   cc:   Ernestina Penna, M.D.  Fax: [phone]   Learta Codding, MD,FACC  [address]  [place], [place] [address]   Kassie Mends, M.D.  [address]  [place] , [place] [address]

## 2011-03-21 NOTE — Assessment & Plan Note (Signed)
[facility]                         ELECTROPHYSIOLOGY OFFICE NOTE   SULEIMAN, FINIGAN                      MRN:          [id number]  DATE:11/19/2007                            DOB:          02/22/1961    Mr. Stephen [name] is seen following ICD implantation for primary prevention in  the setting of ischemic heart disease.  He has had no intercurrent  episodes.  He does have problems with chronic pain.  He has seen Dr.  Andee Lineman and underwent stress testing in October, which was negative.   MEDICATIONS:  Include:  1. Aspirin 325.  2. Omeprazole.  3. Furosemide.  4. Enalapril 2.5.  5. Bisoprolol 5.  6. Xanax.   EXAMINATION:  VITAL SIGNS:  His blood pressure today was 135/91 with a  pulse of 74.  LUNGS:  His lungs were clear.  HEART:  Sounds were regular.  EXTREMITIES:  Without edema.   Interrogation of his Medtronic ICD demonstrates a R-wave of 13.2 with  impedance of 496, a threshold of 1 volt at 0.2.  High voltage  impedance  was 50/66.   IMPRESSION:  1. Ischemic heart disease.  2. Status post implantable cardiac defibrillator for the above.  3. Chronic chest pain without explanation.   He is to see Dr. Karilyn Cota.  Will see him again one year's time.  He will  be followed remotely in the interim.     Duke Salvia, MD, [facility]  Electronically Signed    Stephen [name]/MedQ  DD: 11/19/2007  DT: 11/19/2007  Job #: [id number]   cc:   Willeen Niece

## 2011-03-24 NOTE — Assessment & Plan Note (Signed)
[facility]                          [facility] [facility] OFFICE NOTE   DEONTRAY, HUNNICUTT                      MRN:          [id number]  DATE:12/24/2006                            DOB:          July 04, 1961    PRIMARY CARDIOLOGIST:  Learta Codding, M.D.   PRIMARY ELECTROPHYSIOLOGIST:  Duke Salvia, M.D.   REASON FOR OFFICE VISIT:  Scheduled 79-month followup.  Please refer to  Dr. Margarita Mail previous clinic note of June 2007 for full details.   Since last seen in the clinic, the patient reports no significant change  from his baseline level of exercise tolerance.  Of note, however, he  states that he can easily climb a full flight of stairs with no  associated chest pain or dyspnea.   Unfortunately, the patient continues to smoke, currently about 10  cigarettes a day, and states that he has tired several different  modalities in the past to no avail.  However, he states that he does  want to quit smoking.   The patient was seen in the [facility] pacemaker clinic approximately 3  weeks ago and he reports that he is due to follow up with Dr. Sherryl Manges in August of this year.   When last seen in the clinic, the patient was started on beta blocker  with low-dose bisoprolol as well as low-dose Lasix.  Most recent blood  work last December revealed normal electrolytes, renal function, and  CBC.  Liver profile notable for total cholesterol 105, triglyceride 193,  HDL 35, and LDL 31.  Liver enzymes were normal.   The patient reports no interim firing of his defibrillator.   CURRENT MEDICATIONS:  1. Aspirin 325 daily.  2. Plavix.  3. Prilosec 20 b.i.d.  4. Enalapril 2.5 b.i.d.  5. Paxil 30 daily.  6. Lasix 20 daily.  7. Lipitor 10 daily.  8. Bisoprolol 2.5 daily.  9. CPAP.   PHYSICAL EXAMINATION:  VITAL SIGNS:  Blood pressure 149/91, pulse 97 and  regular, weight 278.6 (up 16 pounds).  GENERAL:  A 50 year old male, obese, sitting upright in  no apparent  distress.  HEENT:  Normocephalic, atraumatic.  NECK:  Palpable bilateral carotid pulses without bruits; no JVD at 90  degrees.  LUNGS:  Diminished breath sounds in the bases, without crackles or  wheezes.  HEART:  Regular rate and rhythm (S1, S2).  No significant murmurs.  ABDOMEN:  Protuberant, nontender, with intact bowel sounds.  EXTREMITIES:  Palpable peripheral pulses with trace lower extremity  edema.  NEUROLOGIC:  No focal deficit.   IMPRESSION:  1. Ischemic cardiomyopathy.      a.     Ejection fraction 40-45% by 2-D echocardiogram June 2007.      b.     Status post acute inferior myocardial infarction/tandem bare-       metal stenting April 2006.      c.     Patent stents with residual nonobstructive coronary artery       disease; ejection fraction 35-40% by cardiac catheterization       August 2006.  d.     Status post ventricular fibrillation arrest associated with       anterior myocardial infarction April 2006.  2. Status post Medtronic (single-chamber) implantable cardioverter-      defibrillator implantation by Dr. Sherryl Manges October 2006.  3. Chronic systolic heart failure currently NYHA class II.  4. Obstructive sleep apnea/on CPAP.  5. Chronic obstructive pulmonary disease/ongoing tobacco.  6. Mixed dyslipidemia:  Hypertriglyceridemia/low HDL.  7. Hypertension.  8. History of alcohol abuse.   PLAN:  1. Increase bisoprolol to 5 mg daily for better blood pressure and      heart rate control.  2. Prescribe Chantix for assistance in smoking cessation.  3. Continue Plavix until April of this year, representing 2 years out      from his myocardial infarction treated with bare-metal stenting,      although the patient seems to recall that he had a drug-eluting      stent at that time.  The patient is to then continue on low-dose      aspirin indefinitely.  4. Substitute Lipitor with Crestor 5 mg daily, for better management      of low HDL.  The  patient will need a followup fasting lipid/liver      profile in 3 months.  5. Schedule return clinic followup with myself and Dr. Andee Lineman in 6      months.  6. The patient is to keep previously scheduled followup with Dr.      Sherryl Manges later this year.     Gene Serpe, PA-C  Electronically Signed      Learta Codding, MD,FACC  Electronically Signed   GS/MedQ  DD: 12/24/2006  DT: 12/25/2006  Job #: [id number]   cc:   Montey Hora

## 2011-03-24 NOTE — Consult Note (Signed)
NAME:  Stephen [name], Stephen [name] NO.:  [id number]   MEDICAL RECORD NO.:  [id number]          PATIENT TYPE:  AMB   LOCATION:                                FACILITY:  [facility]   PHYSICIAN:  Lionel December, M.D.    DATE OF BIRTH:  11-22-1960   DATE OF CONSULTATION:  DATE OF DISCHARGE:                                   CONSULTATION   REQUESTING PHYSICIAN:  Dr. Andee Lineman.   REASON FOR CONSULTATION:  Refractory GERD, chest pain.   HISTORY OF PRESENT ILLNESS:  Stephen [name] is a 50 year old Caucasian male who  presents today with complaints of severe acid reflux. He notes he had severe  chest pain back in December of 2002. He was diagnosed with GERD. He has  continued to have significant refractory type symptoms over the last several  years. He complains of continuous burning ache to his anterior chest. He  also complains of intermittent heartburn and indigestion which is made worse  with certain foods. He was on Nexium for about three years. He also takes  Reglan 2.5 mg q.i.d. More recently, he was tried on omeprazole 20 mg daily  which he states did not work very well. He has also tried Protonix 40 mg  daily which worked somewhat better although have not relieved his symptoms  completely. He did have an EGD back in 2004 by Dr. Madilyn Fireman in [place]. He  is unsure of the findings. He also complains of occasional solid food  dysphagia. He notes he has to drink several sips of liquids to get down  some solid foods. He denies any regurgitation or vomiting but does have  transient nausea. He notes bowel movements generally every day or every  other day. However, more recently, he has gone up to three days without a  bowel movement, having some abdominal bloating and flatulence. He denies any  rectal bleeding or melena.   PAST MEDICAL HISTORY:  1.  Coronary artery disease. MI in April of 2006 and status post angioplasty      with two stents placed.  2.  Left knee replacement in 2001.  3.  He  is currently on Amoxil for strep pharyngitis.   CURRENT MEDICATIONS:  1.  Aspirin 325 mg daily.  2.  Multivitamin daily.  3.  Reglan 2.5 mg q.i.d.  4.  Librax 10 mg q.i.d.  5.  Omeprazole 20 mg daily.  6.  Plavix 75 mg daily.  7.  Lipitor 10 mg daily.  8.  Toprol-XL 25 mg daily.  9.  Lasix 20 mg daily.  10. Enalapril 5 mg b.i.d.  11. Amoxicillin, completing 10 day course for strep pharyngitis.   ALLERGIES:  HALDOL, ATIVAN, and OXYCONTIN.   FAMILY HISTORY:  No known family history of colorectal carcinoma. Mother  deceased at age 38 secondary to cerebral aneurysm. He has one sister with  diabetes mellitus, one brother with CVA.   SOCIAL HISTORY:  Stephen [name] has been married for 24 years. He has two healthy  children. He is employed full time with the [employer]. He  has a  25+-pack-year tobacco history. He currently drinks sporadically;  however, he did have a history of drinking a couple of beers or some whisky  every evening. He notes he has not been doing this recently. Does have a  history of remote marijuana use. No recent drug use.   REVIEW OF SYSTEMS:  CONSTITUTIONAL:  Denies any fever or chills. Weight  steadily increasing. Denies any anorexia or early satiety. CARDIOVASCULAR:  See HPI. HEENT:  He is currently being treated for strep. PULMONARY:  Denies  any shortness of breath, dyspnea, cough, or hemoptysis. GASTROINTESTINAL:  See HPI.   PHYSICAL EXAMINATION:  VITAL SIGNS:  Weight 252.5 pounds, height 72 inches,  temperature 98 degrees, blood pressure 122/74, pulse 72.  GENERAL:  Stephen [name] is an obese, 7 year old Caucasian male who is alert,  oriented, pleasant, and cooperative in no acute distress.  HEENT:  Sclerae are clear, nonicteric. Conjunctivae are pink. Oropharynx  pink and moist without any lesions.  NECK:  Supple without any masses or thyromegaly.  CHEST:  Heart regular rate and rhythm with normal S1 and S2.  LUNGS:  Clear to auscultation  bilaterally.  ABDOMEN:  Protuberant with positive bowel sounds x4. No bruits auscultated.  Soft, nontender, nondistended without palpable mass or hepatosplenomegaly,  although liver edge is palpable 1 to 2 fingerbreadths below the right costal  margin. There is no rebound tenderness or guarding.  RECTAL:  Deferred.  EXTREMITIES:  Without edema or clubbing bilaterally.  SKIN:  Pink, warm and dry without any rash or jaundice.   IMPRESSION:  Stephen [name] is a 9 year old Caucasian male with a four-year  history of refractory GERD symptoms as well as persistent noncardiac chest  pain. I feel evaluation is necessary to rule out complicated  gastroesophageal reflux disease including development of Barrett's  esophagus. He also complains of intermittent solid-food dysphagia, and we  need to rule out esophageal web, ring, or stricture.   As far as his constipation is concerned. It seems to be transient over the  last couple of weeks. We will give a trial of MiraLax.   PLAN:  1.  We will schedule EGD with possible ED. Depending on findings, he may      also benefit from a placement of a Bravo study. We will ask him to hold      his aspirin for three days prior to the procedure.  2.  Fibersure samples given.  3.  Prescription was given for MiraLax 17 g daily p.r.n. constipation      ____________ refill Protonix 40 mg daily #30 with 0 refills - two weeks      of Protonix samples were given, Colace 100 mg 1 to 2 p.o. daily p.r.n.      constipation #60 with 1 refill, and recommended weight loss and      discussed appropriate dietary changes.  4.  Further recommendations to follow procedure.   We would like to thank Dr. Andee Lineman for allowing [place] to participate in the care  Stephen [name].      Nicholas Lose, N.P.      Lionel December, M.D.  Electronically Signed    KC/MEDQ  D:  07/19/2005  T:  07/19/2005  Job:  [id number]  cc:   Learta Codding, M.D. [facility]  [address]  [address]   [place]  [place] [address]

## 2011-03-24 NOTE — Discharge Summary (Signed)
NAME:  Stephen [name], Stephen [name] NO.:  [id number]   MEDICAL RECORD NO.:  [id number]          PATIENT TYPE:  INP   LOCATION:  3732                         FACILITY:  [facility]   PHYSICIAN:  Charlton Haws, M.D.     DATE OF BIRTH:  1961-07-19   DATE OF ADMISSION:  04/23/2005  DATE OF DISCHARGE:  04/25/2005                                 DISCHARGE SUMMARY   PRINCIPAL DIAGNOSES:  1.  Chest pain.  2.  Lightheadedness.  3.  Nausea.   OTHER DIAGNOSES:  1.  Coronary artery disease, status post inferior ST elevation MI in May of      2006, complicated by ventricular fibrillation arrest requiring multiple      shocks, intubation and primary stenting of the right coronary artery      with two bare metal stents.  2.  Gastroesophageal reflux disease.  3.  Status post methicillin-sensitive Staphylococcus aureus pneumonia.  4.  Mild anoxic brain injury.  5.  Tobacco abuse.   ALLERGIES:  No known drug allergies.   PROCEDURES:  1.  Left heart cardiac catheterization.  2.  Chest CT.   HISTORY OF PRESENT ILLNESS:  A 50 year old white male with  history of CAD  status post recent inferior ST elevation MI in May of 2006, which was  complicated by VF arrest requiring multiple shocks, intubation, and  subsequent primary stenting of right coronary artery with two bare metal  stents.  Post intervention, course was complicated by MSSA pneumonia as well  as mild anoxic brain injury.  He was discharged from the hospital in May.  He was doing relatively well, has been increasing his physical activity  gradually, exercising 20 to 30 minutes five times a week.  He is not  currently enrolled in cardiac rehab.  He had follow-up with Dr. Andee Lineman in  the office with functional study showing no inducible ischemia.  On April 23, 2005, he was working on his yard and developed lightheadedness, nausea and  chest pain which persisted for approximately 30 minutes requiring him to  take sublingual nitroglycerin  which did not relieve his pain.  He  subsequently called 911 and was taken to the [facility]  ED for further evaluation.  Patient ruled out for MI with two negative  cardiac enzymes.  He was admitted to the [facility] Cardiology Service.   HOSPITAL COURSE:  Following rule out, the patient was set up for left heart  cardiac catheterization  which took place on April 24, 2005, showing  insignificant coronary disease and patency of the previously placed RCA  stents.  EF was 35 to 40% with severe inferior akinesis.  Considering there  was no identifiable cause for his chest pain based on catheterization, he  underwent chest CT to rule out PE this morning which was negative.  The  patient did not have any further episodes of chest discomfort or  lightheadedness and being discharged home today in satisfactory condition.   DISCHARGE LABORATORY DATA:  Hemoglobin 12.5, hematocrit 38.3, wbc 7.0,  platelets 227.  Sodium 139, potassium 3.8, chloride 106,  CO2 23, BUN 13,  creatinine 1.0, glucose 117.  PT 13.4, INR 1.1.  Cardiac enzymes negative  x2.  Total cholesterol 89, triglycerides 139,  HDL 31, LDL 30.  Calcium 9.2.  BNP 81.8.   DISPOSITION:  Patient is being discharged home today in good condition.   FOLLOW UP APPOINTMENTS:  He has follow-up with Dr. Montey Hora in one to  two weeks and see Dr. Andee Lineman for groin check in two weeks.  The patient  should have a repeat chest CT in three months to be done in [facility]  Cardiology office to follow up lung nodule seen on CT during this admission.   OUTSTANDING LAB STUDIES:  None.   DISCHARGE MEDICATIONS:  1.  Aspirin 81 mg daily.  2.  Plavix 75 mg daily.  3.  Toprol XL 25 mg daily.  4.  Nexium 40 mg daily.  5.  Reglan 10 mg daily with meals.  6.  Lipitor 10 mg q.h.s.   DURATION OF DISCHARGE ENCOUNTER:  45 minutes.      Christop   CRB/MEDQ  D:  04/25/2005  T:  04/25/2005  Job:  [id number]

## 2011-03-24 NOTE — Procedures (Signed)
REFERRED BY:  Verne Grain, MD   EEG Number [phone].   HISTORY:  This is a[age] year old patient who is being evaluated for altered  mental status following a myocardial infarction.  This is a portable EEG  recording.  No skull defects are noted.   MEDICATIONS:  Not listed.   EEG CLASSIFICATION:  Dysrhythmic Grade 1, generalized.   DESCRIPTION OF THE RECORDING:  Background rhythm in this recording  consistent with moderately well modulated, medium amplitude 7 Hz background  activity that is reactive to eye opening and closure.  As the record  progresses, the mild background theta frequency slowing is seen throughout  the recording and relatively persistent.  Photic stimulation and  hyperventilation are not performed during this time.  At no time during the  recording did there appear evidence of active spike or spike wave discharges  or evidence of focal slowing. EKG monitor shows no evidence of cardiac  rhythm abnormalities and heart rate of 78.   IMPRESSION:  This is a minimally abnormal EEG recording  due to mild  background slowing in the theta frequency range.  Such a recording is  nonspecific and can be seen with any toxic or metabolic encephalopathy  process.  No epileptiform discharges are seen at any time.      EAV:WUJW  D:  03/14/2005 15:39:04  T:  03/14/2005 19:39:55  Job #:  [id number]

## 2011-03-24 NOTE — H&P (Signed)
NAME:  [name], [name] NO.:  [id number]   MEDICAL RECORD NO.:  [id number]          PATIENT TYPE:  INP   LOCATION:  4728                         FACILITY:  [facility]   PHYSICIAN:  Jesse Sans. Wall, M.D.   DATE OF BIRTH:  10-Apr-1961   DATE OF ADMISSION:  06/23/2005  DATE OF DISCHARGE:                                HISTORY & PHYSICAL   CHIEF COMPLAINT:  Chest pain, much like I had when I had my heart attack.   HISTORY OF PRESENT ILLNESS:  Mr. Stephen [name]. Cain is a 50 year old white  male who is transferred from [facility] emergency department  tonight for further evaluation of recurrent chest pain.   His history is significant for an inferior wall infarct complicated by VFIB  arrest in April of this year.  He had stenting of the right coronary artery.  Records are pending at this time.   He has been having recurrent chest pain. He had dobutamine stress  echocardiogram with Dr. Lewayne Bunting June 15, 2005 which was negative for  ischemia; ejection fraction 35% with inferior and inferior septal akinesia.  Electrocardiogram at [facility] tonight showed no changes.  Labs were  unremarkable.  His symptoms improved with nitroglycerin and a  gastrointestinal cocktail.   PAST MEDICAL, FAMILY AND SOCIAL HISTORY:  Please refer to previous history  and physical report.   REVIEW OF SYSTEMS:  Negative other than the history of present illness.  He  is having no reflux symptomatology.   CURRENT MEDICATIONS:  1.  Nitroglycerin PRN.  2.  Lipitor 10 mg daily.  3.  Toprol 25 mg daily.  4.  Plavix 75 mg daily.  5.  Reglan 10 mg daily.  6.  Librax daily.   ALLERGIES:  He has no known drug allergies.   PHYSICAL EXAMINATION:  VITAL SIGNS:  Blood pressure 102/71, pulse 60 and  regular, respirations 18, temperature is 97.8.  GENERAL APPEARANCE:  He is in no acute distress.  SKIN:  Warm and dry.  HEENT: He is bearded.  Sclerae clear.  Extraocular movements  intact.  Pupils  equal, round, reactive to light and accommodation.  Dentition and ears  intact.  NECK: Upstrokes equal bilaterally without bruits. No jugular venous  distention.  Thyroid is not enlarged.  Trachea is midline.  LUNGS:  Clear.  HEART:  Reveals a regular rate and rhythm without murmurs, rubs or gallops.  ABDOMEN:  Soft with good bowel sounds.  There is no midline bruit.  There is  no hepatomegaly.  EXTREMITIES:  Without cyanosis, clubbing or edema.  Pulses are intact.  NEUROLOGICAL:  Examination is intact.   CLINICAL DATA:  Electrocardiogram shows an inferior wall infarct with ST  segment and T wave changes in II, III and aVF, V5 and V6, which are  unchanged since his dobutamine stress echocardiogram.   LABORATORY DATA:  Unremarkable.  First troponin and CK-MB are negative.   ASSESSMENT:  Chest pain with known coronary disease.  He continues to have  symptoms which are difficult to sort out.  Some of this responded to  sublingual  nitroglycerin tonight, some responded to a gastrointestinal  cocktail.  A recent negative dobutamine stress echocardiogram except for  inferior and inferior septal infarct, ejection fraction 35% but no ischemia.  This is becoming a diagnostic dilemma.  I suspect it is non-cardiac.   PLAN:  1.  Continue current medications.  2.  Telemetry.  3.  NPO for cardiac catheterization.  The indications, risks, potential      benefits have been discussed with the patient.  He agrees to proceed.      Thomas C. Wall, M.D.  Electronically Signed     TCW/MEDQ  D:  06/23/2005  T:  06/23/2005  Job:  [id number]   cc:   [facility]   Learta Codding, M.D. [facility]  [address]  [address]  [place]  [place] [address]

## 2011-03-24 NOTE — Op Note (Signed)
NAME:  Stephen, [name] NO.:  [id number]   MEDICAL RECORD NO.:  [id number]          PATIENT TYPE:  AMB   LOCATION:  DAY                           FACILITY:  [facility]   PHYSICIAN:  Lionel December, M.D.    DATE OF BIRTH:  February 12, 1961   DATE OF PROCEDURE:  08/08/2005  DATE OF DISCHARGE:                                 OPERATIVE REPORT   PROCEDURE:  Esophagogastroduodenoscopy with placement of Bravo device for pH  study.   INDICATIONS:  Stephen [name] is a 50 year old Caucasian male with known coronary  artery disease who is status post angioplasty and stenting 6 months ago. He  continues to have chest pain. Dr.  Andee Lineman feels that his chest pain is  noncardiac. He also has chronic GERD. He is presently on omeprazole. He  feels his symptoms are poorly controlled. He has frequent heartburn. At  times, he cannot differentiate whether he is having heartburn and/or chest  pain. He has occasional dysphagia to solids. Since his major concern is  chest pain, I feel that he should undergo pH study to determine whether not  his chest pain is due to acid reflux. Procedure risks were reviewed with the  patient, and informed consent was obtained.   PREMEDICATION:  Cetacaine spray pharyngeal topical anesthesia, Demerol 40 mg  IV, Versed 6 mg IV.   FINDINGS:  Procedure performed in endoscopy suite. The patient's vital signs  and O2 saturation were monitored during the procedure and remained stable.  The patient was placed in left lateral position, and Olympus videoscope was  passed via oropharynx without any difficulty into esophagus.   Esophagus. Mucosa of the esophagus was normal. GE junction was at 40 cm from  the incisors. No ring, stricture or hernia was noted.   Stomach. It was empty and distended very well insufflation. Folds of  proximal stomach were somewhat prominent. There was a single small polyp at  antrum towards the anterior wall with erosion and specks of blood. This was  biopsied for histology. There was prepyloric erosion. Pyloric channel was  patent. Angularis, fundus and cardia were examined by retroflexing the scope  and were normal.   Duodenum. Bulbar mucosa was normal. Scope was passed to the second part of  duodenum where mucosa and folds were normal. Endoscope was withdrawn. It was  decided to proceed with placement of Bravo device. It was calibrated. Bravo  device already loaded onto the delivery system and was passed blindly via  oropharynx into esophagus to 34 cm. It was connected to a suction device for  30 seconds. The plunger was pushed to secure the device to the esophageal  mucosa. It was rotated clockwise and gradually withdrawn. Endoscope was  passed again, and Bravo device appeared to be in good position. Pictures  taken for the record. Endoscope was withdrawn. The patient tolerated the  procedure well.   FINAL DIAGNOSIS:  No endoscopic evidence of reflux esophagitis, stricture or  ring formation. Therefore, Bravo device placed for 48-hour pH study which  would be on therapy.   A small antral polyp with erosion which was biopsied  for histology. Antral  gastritis.   RECOMMENDATIONS:  He will continue his usual therapy. He can resume his  aspirin. He will keep symptom diary and return to this facility in 48 hours.  Further recommendations will be made after this study is reviewed.      Lionel December, M.D.  Electronically Signed     NR/MEDQ  D:  08/08/2005  T:  08/08/2005  Job:  [id number]   cc:   Learta Codding, M.D. [facility]  [address]  [address]  [place]  [place] [address]   Ernestina Penna, M.D.  Fax: [phone]

## 2011-03-24 NOTE — H&P (Signed)
NAME:  Stephen [name], Stephen [name] NO.:  [id number]   MEDICAL RECORD NO.:  [id number]          PATIENT TYPE:  INP   LOCATION:  1827                         FACILITY:  [facility]   PHYSICIAN:  Anna Genre. Maisie Fus, M.D. [facility] OF BIRTH:  [birth year]   DATE OF ADMISSION:  04/23/2005  DATE OF DISCHARGE:                                HISTORY & PHYSICAL   PHYSICIANS:  1.  Learta Codding, M.D., primary cardiologist.  2.  Dr. Zenda Alpers in [place], [place], primary care physician.   CHIEF COMPLAINT:  Angina.   HISTORY OF PRESENT ILLNESS:  A 50 year old gentleman with a history of  coronary artery disease status post an inferior ST elevation MI in May of  2006 which was complicated by ventricular fibrillation arrest requiring  multiple shocks, intubation, and subsequent primary stenting of the right  coronary artery which was the infarct-related artery with two bare-metal  stents. Post-intervention course was complicated by MSSA, pneumonia, mild  anoxic brain injury. The patient has done relatively well since discharge  from the hospital and has been doing increasing gradual physical activity.  Of note, he is not currently enrolled in cardiac rehabilitation.   The patient states that he has gone back to see Dr. Vernie Shanks. DeGent twice  since his hospitalization and two weeks ago underwent a stress perfusion  study which did not show any abnormalities. The report is not available.  Today, the patient was working out in his yard most of the day and despite  what he reports as significant hydration he developed some lightheadedness,  nausea, and chest pain. This persisted for 30 minutes requiring him to take  a sublingual nitroglycerin which did not relieve his pain. He subsequently  called 9-1-1 and was brought to the ER for evaluation.   PAST MEDICAL HISTORY:  1.  Cardiac history is as noted.  2.  MSSA pneumonia.  3.  Mild anoxic brain injury.  4.  GERD.  5.  Status post bare-metal to  the RCA.  6.  Ventricular fibrillation arrest in the setting of ST elevation MI. EF      was not available. It was presumed to be normal, however.   MEDICATIONS:  1.  Plavix 75 mg daily.  2.  Aspirin 325 mg daily.  3.  Nexium 40 mg daily.  4.  Toprol 25 mg daily.  5.  Lipitor 10 mg daily.  6.  Multivitamin daily.   SOCIAL HISTORY:  Lives in [place] with his wife. He reports quitting smoking  after his MI, although he states that he has had a few cigarettes here and  there since being discharged. Alcohol is occasional usage. Denies any drug  use. He is exercising regularly, 20 to 30 minutes a day, 5 times a week.   REVIEW OF SYMPTOMS:  Only positive for cardiac as previously described. He  is a full code. The remainder of his review of systems is negative.   PHYSICAL EXAMINATION:  VITAL SIGNS:  Blood pressure is 110/60 (repeat  102/70), pulse is 60. He is afebrile. Weight is 240 pounds.  GENERAL:  He  is well-appearing.  HEENT:  Within normal limits.  NECK:  Supple. There are no appreciable lymphadenopathy. He has no  thyromegaly. His JVP is not elevated.  CARDIOVASCULAR:  Regular rate and rhythm. Normal S1 and S2. There is no  gallops, murmurs or rubs.  LUNGS:  Clear to auscultation bilaterally.  ABDOMEN:  Soft and nontender. There is no organomegaly. He does have an  obese abdomen.  RECTAL:  Not performed.  GENITOURINARY:  Not performed.  EXTREMITIES:  No edema. He has 2+ dorsalis pedis pulses bilaterally.  SKIN:  No significant abnormalities.  NEUROLOGICAL:  He is intact and he is alert and oriented x3.   LABORATORY DATA:  His EKG showed normal sinus rhythm with a rate of 62, axis  is normal. He had normal PR, QRS, and QTC intervals. There are inferior Q-  waves consistent with his prior infarct and there are no current ST and T-  wave changes.   His laboratory values have not completely returned with the exception of  cardiac enzymes which are negative.   IMPRESSION:   1.  A 50 year old guy with a recent ST elevation myocardial infarction. He      presents today with angina versus noncardiac chest pain. Given his      history of coronary artery disease, we will check serial enzymes, put      him on enoxaparin, continue his other medications for secondary      prevention. Also, I have counseled the patient extensively on smoking      cessation. The patient did have a functional study approximately two      weeks ago which did not reveal any areas of ischemia by his report.      Would like to review the report in detail to see what his exercise      tolerance was. If his functional study is adequate and negative, we can      essentially watch him overnight, rule him out, and likely be ready for      discharge in the morning. We will titrate his antianginal medications as      tolerated and perhaps consider a long-acting nitrate if his chest pain      persist. He also may have a component of noncardiac chest pain.  2.  He has an extensive history of gastroesophageal reflux disease. He      states that Nexium is not working. May consider a different agent,      Prevacid or Protonix.  3.  Obstructive sleep apnea:  The patient recently had an outpatient sleep      study which was positive for sleep apnea. The patient is on schedule in      the next month to get fitted for C-PAP.       KLT/MEDQ  D:  04/23/2005  T:  04/24/2005  Job:  [id number]

## 2011-03-24 NOTE — Op Note (Signed)
[facility]  Patient:    Stephen [name], Stephen [name]                      MRN: [id number] Proc. Date: 04/26/00 Adm. Date:  [id number] Disc. Date: [id number] Attending:  Colbert Ewing                           Operative Report  PREOPERATIVE DIAGNOSES: 1. Left knee torn anterior cruciate ligament with anterolateral rotary instability. 2. Medial and lateral meniscus tear.  POSTOPERATIVE DIAGNOSES: 1. Left knee torn anterior cruciate ligament with anterolateral rotary    instability. 2. Medial meniscus tear.  PROCEDURE: 1. Left knee examination under anesthesia, arthroscopy, with arthroscopic    endoscopic anterior cruciate ligament reconstruction, patellar tendon    bone-tendon-bone allograft, biosabsorbable screw fixation. 2. Partial medial meniscectomy. 3. Notchplasty.  SURGEON:  Loreta Ave, M.D.  ASSISTANT:  Arlys John D. Petrarca, P.A.-C.  ANESTHESIA:  General.  ESTIMATED BLOOD LOSS:  Minimal.  TOURNIQUET TIME:  One hour.  SPECIMENS:  None.  CULTURES:  None.  COMPLICATIONS:  None.  DRESSING:  Soft compressive.  OPERATIVE FINDINGS:  Under anesthesia, full motion.  Positive Lachman, positive drawer, positive pivot shift.  Collaterals and posterior cruciate ligament stable.  At arthroscopy, articular cartilage intact throughout. Complex, irreparable tearing posterior third to half medial meniscus, all stretched out to the rim, tapered into remaining meniscus.  Lateral meniscus without discrete, demonstrable tears.  Complete midsubstance ACL tear, irreparable.  Some remaining fibers, markedly attenuated and nonfunctional. Moderately narrow notch.  DESCRIPTION OF PROCEDURE:  Patient brought to the operating room and placed on the operating table in supine position.  After adequate anesthesia had been obtained, exam with findings as described above.  Tourniquet applied, prepped and draped in the usual sterile fashion.  Exsanguinated  with elevation of Esmarch and tourniquet inflated to 350 mmHg.  Three portals created, one superolateral, one each medial and lateral parapatellar.  Inflow catheter introduced in the knee as stated, arthroscope introduced, knee inspected in a serial manner with findings as described above.  Medial meniscus saucerized out with baskets, tapered in smoothly with the shaver.  ACL debrided. Notchplasty with shaver and high-speed bur.  Lateral meniscus thoroughly probed and inspected.  PCL intact.  Good patellofemoral tracking.  Allograft had been prepared for 10 mm tunnels.  Central portion tube with Vicryl and either end tagged with #5 Ethibond.  With the arthroscope in place, the tibial guide seated on the posterior aspect of the ACL footprint on the tibia.  The other end through a small vertical incision medial to the tibial tubercle. K-wire driven, placement found to be good, overdrilled with a 10 mm reamer. Debrided with the shaver.  Femoral guide inserted across the tibial tunnel notch and on the back cortex of the femur.  K-wire driven and overdrilled with a 10 mm reamer for appropriate depth and placement for the graft.  All debris cleared.  Tunnels assessed and found to be in good position.  Tube had been passed and inserted through both tunnels and out through a stab wound in the anterolateral thigh.  Nitinol wire brought in through the medial portal and out through the femoral tunnel.  Graft attached to the tube, being passed up, pulled in across the knee, seating the pegs well in the tibia and the femur. The knee was flexed and the femoral peg was fixed with a 9 x 25 bioabsorbable  screw placed over a nitinol wire.  Excellent capture and fixation.  Nitinol wire and fixation sutures removed from the proximal end.  Knee placed at 90 degrees of flexion and posterior drawer applied.  Graft pulled tightly, and the tibial peg was fixed over a nitinol wire with another 9 x 25 screw.   At completion, the knee was examined.  Full motion and excellent stability.  Good clearance of the graft through full motion when viewed arthroscopically. Negative Lachman, negative drawer.  Nitinol wire and sutures had been removed from both ends.  Wounds irrigated and closed with subcutaneous and subcuticular Vicryl, margins of the wound injected with Marcaine.  All portals closed with nylon.  Margins of those injected with Marcaine, as was the knee. A sterile compressive dressing applied.  Tourniquet deflated and removed. Knee immobilizer applied.  Anesthesia was then to proceed with placement of a femoral nerve block for postoperative analgesia.  Following this, the anesthesia reversed and brought to the recovery room.  He tolerated surgery well with no complications. DD:  04/26/00 TD:  04/28/00 Job: [id number]

## 2011-03-24 NOTE — H&P (Signed)
NAME:  Stephen [name], Stephen [name] NO.:  [id number]   MEDICAL RECORD NO.:  [id number]          PATIENT TYPE:  INP   LOCATION:  2926                         FACILITY:  [facility]   PHYSICIAN:  Verne Grain, MD   DATE OF BIRTH:  October 31, 1961   DATE OF ADMISSION:  03/05/2005  DATE OF DISCHARGE:                                HISTORY & PHYSICAL   PRIMARY CARDIOLOGIST:  None.   PRIMARY CARE PHYSICIAN:  Dr. Montey Hora, [facility].   CHIEF COMPLAINT:  Inferior ST elevation myocardial infarction with  ventricular fibrillation requiring multiple shocks, sedation, intubation and  antiarrhythmic therapy prior to transfer from [facility].   HISTORY OF PRESENT ILLNESS:  A 50 year old male with history of heavy  tobacco use (2 to 2.5 packs per day x29 years).  One to two alcoholic beverages daily. Works for the [employer] and was in his usual state of health with no complaints, no  known medical problems other than gastroesophageal reflux disease. No  previous cardiac history. Noted onset of chest discomfort while watching TV,  accompanied by nausea, at approximately 9 p.m. on March 04, 2005. The  patient went to bed to rest in hopes of feeling better. However he quickly  developed diaphoresis and had vomiting accompany his nausea. He subsequently  reported to the [facility] emergency room. In triage he was quickly taken to  the back room and while undergoing interview, put his head down on his arm  and subsequently passed out. The patient's rhythm analysis indicated  ventricular fibrillation. He was shocked back to sinus rhythm with a 12-lead  EKG revealing a marked ST segment elevation in his inferior leads as well as  reciprocal depression in his precordial leads. The patient was subsequently  intubated, sedated, and initiated on a lidocaine drip. He had several more  episodes of ventricular fibrillation requiring multiple shocks (total of 9  shocks prior to transfer).  The patient also was initiated on an amiodarone  drip in addition to the lidocaine and administered 1 gram of magnesium prior  to transfer to [facility] for primary PCI. The patient was taken  directly to the cardiac catheterization lab with Dr. Riley Kill who changed his  regimen to include Pavulon, primary PCI was achieved after finding of a  large 100% obstructed right coronary artery. This lesion was opened and  stable with placement of two stents. Left ventriculogram revealed an  ejection fraction of approximately 40 to 50% inferior, severe hypo to  akinesis with no significant mitral regurgitation. The patient did have a  temporary pacing wire placed during the procedure, however he did not  require temporary pacing and therefore this wire was removed prior to  transfer to CCU. The sheath was left in place with some mild oozing that was  controlled with conservative measures prior to transfer. The patient  continues to be intubated and had orders written for Valium 5 mg IV to  assist with sedation en route to the CCU. It was noted that the patient  received large amounts of IV fluids at that hospital.  The patient had  significantly elevated blood pressures in the cardiac catheterization  laboratory with heart rates in the 90s. He was given a small amount of IV  Lasix with a large urine output following administration of this medication.  The patient also was simultaneously given a nitroglycerin drip which he  tolerated well with no clear evidence of right ventricular infarct  physiology.   ALLERGIES/ADVERSE REACTIONS:  No known drug allergies.   PREVIOUS MEDICATIONS:  None.   PAST MEDICAL HISTORY:  1.  Tobacco heavy use, 2 to 2.5 packs per day.  2.  History of gastroesophageal reflux disease status post normal EGD in      December of 2004.  3.  History of left knee surgery for a torn ACL and medial/lateral meniscus      tear in 2001.  4.  No  diabetes. No hypertension. No known hyperlipidemia. No known family      history of coronary artery disease.   SOCIAL HISTORY:  The patient lives in [place] with his wife of 24  years. 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 He has two children aged 51 and 58 who are both healthy. He  smokes 2 to 2.5 packs per day x29 years as previously mentioned. He consumes  1-2 alcoholic beverages daily. He has no history of alcohol withdrawal. His  wife reports no illicit drug use in the past. No known heroin, crack,  cocaine.   FAMILY HISTORY:  The patient's mother died at age 66 of a cerebral aneurysm.  The patient's father is alive at age 56 with diabetes. The patient has one  brother who is healthy. One sister who is healthy.   REVIEW OF SYSTEMS:  (Per wife). Essentially negative other than what is  described in the history of present illness. No recent fevers or chills. No  acute auditory or visual changes. No previous headache. No acute rash. He  did have chest pain and shortness of breath, nausea, vomiting and syncope  with the arrest as mentioned, accompanied by diaphoresis as described in the  history of present illness. He had no cough, no wheezing, no urinary  symptoms, no bowel or bladder complaints. No neuropsychiatric complaints. No  heat or cold intolerance reported suggestive of endocrinologic abnormality.  All other systems are negative.   PHYSICAL EXAMINATION:  VITAL SIGNS:  Temperature 97, heart rate 98,  respiratory rate 27, intubated on a ventilator. Blood pressure 170/95.  GENERAL:  The patient is sedated, intubated.  HEENT:  Normocephalic, atraumatic head with NG tube in place with a small  amount of blood around the naris. His pupils are mildly mydriatic. His face  is generally symmetric. The patient received Pavulon, therefore neurologic  examination was limited. NECK:  Supple, obese, lymphadenopathy was not  appreciated.  CARDIOVASCULAR:  Reveals a regular S1 and regular S2, there are no  appreciable murmurs.  LUNGS:  Lung fields were mostly clear to auscultation bilaterally. Intubated  with the chest rising symmetrically.  RIGHT GROIN:  Sheath in place. Distal pulses were easily palpated.  EXTREMITIES:  Distal extremities were reasonably well perfused. Lower  extremity examination revealed no evidence of edema.  ABDOMEN:  Obese, soft, nontender, nondistended with positive bowel sounds.  NEUROLOGIC EXAM:  Again was limited secondary to Pavulon administration.   EKG:  Inferior ST elevations for approximately 6 mm with marked reciprocal  precordial ST depression. A 12-lead EKG reveals sinus rhythm at a rate of  70,  subsequent junctional escape when obtained at [facility]  Currently the patient is in sinus rhythm with a heart rate of 90. Axis is  normal, PR, QRS and QTC intervals are normal.   LABORATORY VALUES:  Many of which are pending. However, preliminary labs  from [facility] revealed a hemoglobin of 12 and platelet count of  452,000.   ASSESSMENT/PLAN:  A 50 year old male with history of heavy tobacco use,  obesity, history of gastric reflux, with acute inferior ST elevation  myocardial infarction secondary to 100% occlusion of a large right coronary  artery treated with primary percutaneous coronary intervention by Dr.  Riley Kill with left ventriculogram revealing an ejection fraction of  approximately 40 to 50% with inferior hypokinesis to akinesis with no  significant mitral regurgitation appreciated.  The patient is currently  intubated as stated, after receiving nine shocks for recurrent ventricular  fibrillation during presentation to [facility]. He was transferred on  lidocaine and amiodarone drips. His lidocaine drip was discontinued upon  leaving the cath lab with continuation of the amiodarone for now.   1.  Inferior ST elevation myocardial infarction.  The patient will be treated      with aspirin, Plavix, Metoprolol IV, Captopril via tube, Lipitor 80 mg      via tube q.h.s., continue nitroglycerin drip, continue amiodarone drip,      lidocaine drip discontinued as mentioned above.  Titrate Metoprolol and      Captopril as tolerated. Ventilator protocol and ______ protocols, will      be initiated. If the patient's requirement to meet his respiratory needs      are minimal then attempt will be made to lighten up the sedation and      wean the ventilator so that possible extubation could be achieved at      some time within the next 24 to 36 hours.  The patient had a temporary pacing wire placed in the cath lab, however, it  was not needed. Therefore this was removed prior to transfer to CCU. The  patient currently has /an elevated blood pressure and is tolerating a  nitroglycerin drip, indicating no obvious evidence of RV infarct physiology. We will continue with current medication regimen. He was given extensive IV  fluids at the outside hospital and was administered a small amount of Lasix  in the cath lab after which he had excellent urinary output. We will  continue the amiodarone until the bottle is empty and then discontinue if  there is no evidence of recurrent arrhythmia. Will continue the  nitroglycerin as administered in the cath lab. We will check a lipid profile  in the morning. Encourage tobacco cessation  throughout the hospitalization. Recommendations for cardiac rehabilitation  to the patient and family.   1.  History of gastroesophageal reflux disease. Treat empirically with      proton pump inhibitor.   1.  CCU prophylaxis, heparin 5000 units SQ q.8h along with PPI as previously      mentioned.   1.  History of alcohol use. Encourage moderation at time of discharge. The      patient has no history of alcohol withdrawal in the past although does      consume alcohol fairly regularly but only at a level of one to  two      drinks per day according to the patient's wife.      DDH/MEDQ  D:  03/05/2005  T:  03/05/2005  Job:  [id number]

## 2011-03-24 NOTE — Cardiovascular Report (Signed)
NAME:  Stephen [name], ADKISON NO.:  [id number]   MEDICAL RECORD NO.:  [id number]          PATIENT TYPE:  INP   LOCATION:  3732                         FACILITY:  [facility]   PHYSICIAN:  Rollene Rotunda, M.D.   DATE OF BIRTH:  [birth year]   DATE OF PROCEDURE:  04/24/2005  DATE OF DISCHARGE:                              CARDIAC CATHETERIZATION   PROCEDURE:  Left heart catheterization/coronary arteriography.   INDICATIONS:  Evaluate patient with chest pain.  He had previous stenting to  his right coronary artery with a large acute inferior infarct and cardiac  arrest.   PROCEDURAL NOTE:  Left heart catheterization was performed via the right  femoral artery.  The artery was cannulated using anterior wall puncture.  A  #6 French arterial sheath was inserted via the modified Seldinger technique.  Preformed Judkins and a pigtail catheter were utilized.  The patient  tolerated the procedure well and left the lab in stable condition.   RESULTS:  Hemodynamics:  LV 116/27, AO 114/75.   Coronaries:  The left main was normal.  The LAD had luminal irregularities.  There was a large first diagonal which was normal.  There was a small second  diagonal which was normal.  Circumflex in the AV groove was normal.  The mid-  obtuse marginal was large and normal.  The right coronary artery was a very  large dominant vessel.  There was proximal 25% stenosis.  There were mid  widely patent stents, with no disease noted within them.  The PDA was large  and normal.  There were two small posterolaterals which were normal.   Left ventriculogram:  The left ventriculogram was obtained via the RAO  projection.  The EF was 35-40%, with severe inferior akinesis.   CONCLUSION:  1.  Ischemic cardiomyopathy.  2.  Patent stents.   PLAN:  The patient will have medical management.  He will have a CT  angiogram to rule out pulmonary embolism.       JH/MEDQ  D:  04/24/2005  T:  04/24/2005  Job:   [id number]   cc:   Montey Hora, P.A.  [facility]

## 2011-03-24 NOTE — Assessment & Plan Note (Signed)
[facility]                         ELECTROPHYSIOLOGY OFFICE NOTE   [name], Stephen                      MRN:          [id number]  DATE:12/03/2006                            DOB:          1961-09-06    Stephen [name] was seen today in the clinic on the 28th of January, 2008, for  followup of his Medtronic Model [id number] Maximo.  Date of implant was  August 28, 2005, for ischemic cardiomyopathy.  On interrogation of his  device today, his battery voltage is 3.19 with a charge time of 7.67  seconds.  R-waves measure 12.4 mV with a ventricular pacing threshold of  1 V at 0.2 msec and a ventricular lead impedence of 560 ohms.  Shock  impedence was 54.  There were no episodes since last interrogation.  He  does have one of the [phone] Alert leads and it was reprogrammed according  to protocol today.  He will send a [facility] transmission at 3 months  with a return office visit in 6 months to see Dr. Graciela Husbands.  This gentleman  has not been seen by a physician since February 2007 and then we can get  him on regularly yearly visits.      Altha Harm, LPN  Electronically Signed      Duke Salvia, MD, [facility]  Electronically Signed   PO/MedQ  DD: 12/03/2006  DT: 12/03/2006  Job #: [id number]

## 2011-03-24 NOTE — Cardiovascular Report (Signed)
NAME:  Stephen [name], Stephen [name] NO.:  [id number]   MEDICAL RECORD NO.:  [id number]          PATIENT TYPE:  INP   LOCATION:  4728                         FACILITY:  [facility]   PHYSICIAN:  Charlies Constable, M.D. [facility] DATE OF BIRTH:  1960/11/25   DATE OF PROCEDURE:  06/26/2005  DATE OF DISCHARGE:                              CARDIAC CATHETERIZATION   CLINICAL HISTORY:  Mr. Ostermiller is 50 years old and had an acute DMI treated  with tandem overlapping bare metal stents by Dr. Riley Kill in April 2006. He  was restudied in June 2006 and found to have nonobstructive disease. He  recently developed recurrent chest pain and was brought back in the hospital  for repeat evaluation for catheterization.   DESCRIPTION OF PROCEDURE:  The procedure was performed via the right femoral  artery using an arterial sheath and 6 French preformed coronary catheters. A  femoral arterial punch was performed and Omnipaque contrast was used. A  right heart catheterization was performed percutaneously through the right  femoral vein using a mini sheath and __________ catheter. The right femoral  artery was closed with Angioseal at the end of the procedure. The patient  tolerated the procedure well and left the laboratory in satisfactory  condition.   RESULTS:  The left main coronary artery was free of significant disease.   The left anterior descending artery gave rise to two diagonal branches and  two septal perforators. These and the LAD proper were free of significant  disease.   The circumflex artery gave rise to an atrial branch, small marginal branch,  and three posterolateral branches. These vessels were free of significant  disease.   The right coronary artery was a moderate size vessel, gave rise to a conus  branch, two right ventricular branches, a posterior descending branch, and  two posterolateral branches. There were tandem overlying stents in the mid  right coronary artery which had 20%  diffuse in-stent renarrowing.   The left ventriculogram performed in the RAO projection showed hypokinesis  of the inferior wall almost to the apex. The overall function was moderately  depressed with an ejection fraction of 35-40%.   Hemodynamic data:  The right atrial pressure was 16 mean. The pulmonary  artery pressure was 36/20 with a mean of 27. The pulmonary wedge pressure  was 22 mean. Left ventricular pressure was 117/23. The aortic pressure was  117/86 with a mean of 103. The cardiac output/cardiac index was 7.3/3.0  L/minute/sqm by Fick.   CONCLUSIONS:  Nonobstructive coronary artery disease with less than 20%  narrowing within the stents in the mid right coronary artery and no  significant obstruction in the circumflex and left anterior descending  arteries with inferior wall hypokinesis and a ejection fraction of 35-40%.   RECOMMENDATIONS:  The patient has nonobstructive coronary disease. I think  his chest pain is probably noncardiac and sounds most like reflux. He does  have moderately elevated pulmonary wedge pressure. Will plan to increase his  reflux regimen and probably double-up on his previous proton pump inhibitor  which I think was  Prilosec. Will have him  follow up with Dr. Madilyn Fireman who he sees for GI. If he  is not intolerant will plan to add an ACE inhibitor for his LF dysfunction.  Will also add low-dose Lasix for elevated filling pressures.  Will plan  discharge later today with follow-up with Dr. Andee Lineman and Dr. Madilyn Fireman and  [facility].           ______________________________  Charlies Constable, M.D. [facility]     BB/MEDQ  D:  06/26/2005  T:  06/26/2005  Job:  [id number]   cc:   [facility]   Learta Codding, M.D. [facility]  [address]  [address]  [place]  [place] [address]   Cardiopulmonary Lab

## 2011-03-24 NOTE — Discharge Summary (Signed)
NAME:  Stephen [name], Stephen [name] NO.:  [id number]   MEDICAL RECORD NO.:  [id number]          PATIENT TYPE:  INP   LOCATION:  2022                         FACILITY:  [facility]   PHYSICIAN:  Arturo Morton. Riley Kill, M.D. [facility] OF BIRTH:  1960/11/24   DATE OF ADMISSION:  03/05/2005  DATE OF DISCHARGE:  03/17/2005                                 DISCHARGE SUMMARY   PROCEDURES:  1.  Cardiac catheterization.  2.  Coronary arteriogram.  3.  Left ventriculogram.  4.  Mechanical ventilation.  5.  PTCA and bare metal stents x2.   DISCHARGE DIAGNOSES:  1.  ST segment elevation myocardial infarction with single vessel disease      status post PTCA in multi link stents x2 to the right coronary artery.  2.  Ventilator-dependent respiratory failure secondary to ventricular      fibrillation arrest.  3.  Ventricular fibrillation arrest requiring multiple shocks and anti-      arrhythmic therapy at [facility] prior to transfer to [facility].  4.  History of tobacco use greater than two packs per day.  5.  Gastroesophageal reflux disease symptoms with a normal EGD.  6.  Torn ACL and a meniscus tear in 2001 status post surgical repair.  7.  Family history of cerebral aneurysm in his mother who died at age 74.  42.  Altered mental status after mechanical ventilation, multifactorial      delirium with a combination of mild hypoxic brain injury as well as      Staph sepsis as well as medication effect, resolved.  9.  Possible obstructive sleep apnea, sleep study as outpatient.  10. History of ETOH use prior to admission with one to two drinks today.  11. Hypotension post catheterization on intravenous dopamine, resolved.  12. Metabolic acidosis post arrest.  13. Pneumonia.  14. Dyslipidemia with a total cholesterol of 83, triglycerides 91, HDL 28,      LDL 37.  15. Borderline hypomagnesemia.  16. Hypocalcemia.  17. Hyperglycemia with a hemoglobin A1C of 5.9.  18. Anemia requiring transfusion a  total of 4 units of packed cells.  Iron      studies showing a slightly low iron level at 37, TIBC within normal      limits April 23.  Saturation low at 9 and B12 level slightly low at 196,      ferritin within normal limits at 44 and haptoglobin elevated at 279.  19. Oral thrush.   HOSPITAL COURSE:  Mr. Wessinger is a 50 year old male with no known history of  coronary artery disease.  He went to [facility] on the day of  admission for chest pain and nausea.  Symptoms started about 4:00.  He had a  ventricular fibrillation arrest after arrival and was shocked a total of  nine times.  He was started on amiodarone and lidocaine.  He was intubated.  His EKG indicated an inferior MI and he was transported urgently to [facility] and taken to the catheterization laboratory.   Mr. Girardin had two bare metal stents placed to the RCA reducing  the stenosis  from 100% to 0.  He had an EF of 50% with inferior wall motion  abnormalities.  He had single vessel coronary artery disease.   Mr. Grandt remained on the ventilator for approximately 48 hours.  A  pulmonary consult was called and they evaluated him and his chest x-ray  showed bibasilar infiltrates.  His sputum was greater than 100,000 colonies  of gram-positive cocci.  It was felt that he had sepsis from pneumonia  complicating his MI.   He was treated for pneumonia which was methicillin-sensitive Staphylococcus  aureus.  He completed his course of antibiotics.  A recheck chest x-ray  showed no pneumonia.   By Mar 10, 2005 his hypotension had resolved and he was off pressors.  He was  improving from a pulmonary standpoint and was extubated on Mar 10, 2005.  He  maintained saturations greater than 90% on O2 and gradually his need for O2  supplementation decreased.  By discharge his saturations were within normal  limits on room air.   Mr. Caso was diagnosed with toxic metabolic encephalopathy/delirium.  He  was on Haldol,  Seroquel, and Klonopin to control confusion and agitation.  Once he was extubated he still had problems with agitation and confusion.  A  neurology consult was called.  They assisted in managing his agitation and  confusion but his symptoms gradually resolved and all his medications were  discontinued.   A psychiatric consult was called and Dr. Jeanie Sewer recommended that if the  psychosis and agitation returned standing Risperdal could be used at 1 mg  b.i.d.  This was not needed and he was not discharged on this medication.  LFTs were checked and his AST and ALT were slightly elevated.  He had been  started on Lipitor on admission for the MI but because of his slightly  elevated LFTs the doses had decreased from 80 mg a day to 10 mg a day.  He  will need follow-up liver profile and lipid profile in six weeks.   Mr. Schappell was seen by cardiac rehabilitation and referred for outpatient  cardiac rehabilitation.   By Mar 17, 2005 Mr. Cantara was feeling well.  A repeat chest x-ray showed no  acute disease.  It was recommended that he not drive.  He was also  encouraged not to use tobacco or alcohol at all.  He had a history of  hallucinations with oxycodone and needs to avoid this medication in the  future.  Because Mr. Geers chest x-ray did not show any infection,  antibiotics were discontinued.  Mr. Zentz was considered stable for  discharge on Mar 17, 2005 with outpatient follow-up arranged.   DISCHARGE INSTRUCTIONS:  He is not to do any driving for now.  He is not to  use alcohol or tobacco.  He is to call the office for problems with the  catheterization site.  He is to follow up with Dr. Andee Lineman on May 31 at 12:45  p.m.   DISCHARGE MEDICATIONS:  1.  Multivitamin with iron daily.  2.  Aspirin 325 mg daily.  3.  Lipitor 10 mg daily.  4.  Plavix 75 mg daily.  5.  Nexium 40 mg daily.  6.  Toprol XL 25 mg daily. 7.  Nitroglycerin sublingual p.r.n.   He is to get an outpatient sleep  study.  He is to get LFTs and lipid profile  in six weeks.      RB/MEDQ  D:  03/17/2005  T:  03/18/2005  Job:  [id number]   cc:   Antonietta Breach, M.D.   [facility]

## 2011-03-24 NOTE — Discharge Summary (Signed)
NAME:  Stephen [name], Stephen [name] NO.:  [id number]   MEDICAL RECORD NO.:  [id number]          PATIENT TYPE:  INP   LOCATION:  6526                         FACILITY:  [facility]   PHYSICIAN:  Learta Codding, M.D. LHCDATE OF BIRTH:  Dec 24, 1960   DATE OF ADMISSION:  08/28/2005  DATE OF DISCHARGE:  08/29/2005                                 DISCHARGE SUMMARY   Discharging time one-half hour.   ALLERGIES:  HALDOL, ATIVAN and OXYCONTIN.   DISCHARGE DIAGNOSES:  Discharging day #1, status post implantation of  Medtronic Maximo cardioverter defibrillator.  Defibrillator threshold study  less than or equal to 15 joules.   SECONDARY DIAGNOSES:  1.  History of inferior myocardial infarction April 2006 with      catheterization at that time to include stenting of the mid right      coronary artery.  2.  Re catheterization August 2006 with stent patent and ejection fraction      35%.  3.  Class II congestive heart failure.  4.  Gastroesophageal reflux disease.  5.  Obstructive sleep apnea.  6.  Ongoing tobacco.  7.  Depression.  8.  Fatigue.  Patient sleeps 12 hours per day per his wife.   PROCEDURE:  August 28, 2005 implantation of Medtronic Maximo cardioverter  defibrillator with defibrillator threshold study.   DISPOSITION:  Stephen [name] ready for discharge post procedure day #1.  He is  achieving 96% oxygen saturation on room air.  He is in sinus rhythm without  ectopy.  His device has been interrogated and all values within normal  limits. The incision is healing nicely without hematoma.  Auscultation of  the heart reveals no rub.   DISCHARGE MEDICATIONS:  The patient is discharged on the following  medications:  1.  Aspirin 325 mg p.o. daily.  2.  Multivitamin daily.  3.  Reglan 2.5 mg four tabs daily.  4.  Librax 10 mg four tablets daily.  5.  Omeprazole 20 mg daily.  6.  Plavix 75 mg daily.  7.  Lipitor 10 mg daily at bedtime.  8.  Toprol XL 25 mg daily.  9.  Furosemide  20 mg daily.  10. Enalapril 5 mg twice daily.   HOSPITAL COURSE:  PROBLEM:  FATIGUE:  Stephen [name] most significant complaint  this admission is the fatigue that he has been feeling since his heart  attack in April.  His wife claims he spends 12 hours a day sleeping and he  just has no pep.  Of note, however, he has sleep apnea and, of course, does  use his CPAP when questioned.  He is also on Librax tablets, four a day for  gastrointestinal complaint.  It is possible that down-titrating this  medication might help a little bit with his drowsiness and somnolence.   HISTORY:  Stephen [name] is a 50 year old male who had an inferior myocardial  infarction April 2006.  He had stenting of his right coronary artery.  He  had anginal equivalent with chest pain.  He has long running history of  gastroesophageal reflux disease and he underwent  re catheterization in  August 2006.  The stents in the right coronary artery were patent.  Ejection  fraction was 35%.  Echocardiogram confirms ejection fraction in this range.  He has been out of work since April. He walks 30 minutes a day, otherwise  helps around the house. He has no history of palpitations, no syncope, no  peripheral edema, no orthopnea.  His qualification for ICD is based on SCD-  He FT.  He will present electively for implantation of cardioverter  defibrillator.  Of note, Dr. Graciela Husbands has talked with the patient at great  length with regard to fatigue, smoking, smoking cessation and depression.  These may all be a inter-related mass of circumstances which should be  addressed.  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  He has a follow up with [facility] on Wednesday September 13, 2005 at  9:45 for a wound check.  He will see Dr. Graciela Husbands in February 2007 and our  office will  call for that appointment.      Stephen [name], P.A.      Learta Codding, M.D. [facility]  Electronically Signed    GM/MEDQ  D:  08/29/2005  T:  08/29/2005  Job:  [id number]   cc:   Ernestina Penna, M.D.  Fax: [phone]   Dr. Andee Lineman, [facility] Office

## 2011-03-24 NOTE — Cardiovascular Report (Signed)
NAME:  Stephen [name], Stephen [name] NO.:  [id number]   MEDICAL RECORD NO.:  [id number]          PATIENT TYPE:  INP   LOCATION:  2926                         FACILITY:  [facility]   PHYSICIAN:  Arturo Morton. Riley Kill, M.D. [facility] OF BIRTH:  08/24/1961   DATE OF PROCEDURE:  03/05/2005  DATE OF DISCHARGE:                              CARDIAC CATHETERIZATION   INDICATIONS:  Mr. Mier is a 50 year old gentleman who presents with acute  inferior myocardial infarction. He presented to the [facility] emergency room.  When he was in triage, the patient developed ventricular fibrillation. He  was resuscitated but had recurrent episodes. He was given intravenous  lidocaine as well as amiodarone. He was eventually stabilized and  transferred emergently with electrocardiographic manifestations of inferior  MI. He was intubated on arrival.  I was able to speak with his wife to gain  permission for the procedure. He was brought into the catheterization  laboratory on event, and we ultimately had to give him paralytic agents to  proceed with the procedure.   PROCEDURE:  1.  Left heart catheterization.  2.  Selective coronary arteriography.  3.  Selective left ventriculography.  4.  Temporary transvenous pacing  5.  PTCA and stenting of the right coronary artery.   DESCRIPTION OF PROCEDURE:  The patient was brought to the catheterization  laboratory, prepped and draped in usual fashion.  Through an anterior  puncture, the right femoral artery was entered. The patient was given  Pavulon, as he was not well controlled on Diprivan. The Diprivan was  discontinued. He was also given Versed.   The catheterization study demonstrated total occlusion of the right coronary  artery.  The left was without critical disease. We did not perform  ventriculogram prior to the reperfusion, largely because of the patient's  lack of prior history, and desire to avoid ventricular stimulation. Heparin  and Tegretol were  given according to protocol. We ensured that the ACT was  greater than 200 seconds.  The lesion was crossed with a high-torque floppy  wire and then subsequently dilated with a 2.25-mm balloon. This was upgraded  to a 2.5 mm x 20 balloon.  Reperfusion was established there was some mild  there is some moderate distal embolization of clot, but this cleared up  after intracoronary verapamil was administered. Following this, we placed a  23 x 40 Vision stent. This resulted in marked improvement in the appearance  of the artery.   There was evidence of either clot or edge disruption on the superior aspect  of the stent.  We elected to place an overlapping 4.5 ultra stent which was  taken up to 13 atm. The balloon was placed slightly more distally and a 4.5-  mm balloon was used to post dilate the 4 mm stent. All of this resulted in,  what appeared to be, near perfect result from the stent procedure. TIMI III  flow was reestablished and the distal embolization was no longer present.  There was TIMI III flow established and there was improvement of the ST  segments. These catheters were then removed.  Pigtail catheter was placed in the left ventricle and ventriculography was  performed in the RAO projection.  The pigtail was then removed, because of  some mild oozing.  The 7-French sheath was upgraded to an 8-French sheath in  the artery, and a 6-French sheath in the vein was upgraded to a 7-French  sheath.  At the beginning of the procedure, we placed a temporary  transvenous pacer in the inferior vena cava, because of potential for  profound reperfusion bradycardia. The temporary wire was subsequently  removed. The patient was taken to the CCU in satisfactory clinical  condition.   HEMODYNAMIC DATA.:  1.  Central aortic pressure 117/88, mean 104.  2.  Left ventricular pressure 145/33.  3.  Less than 5 mm gradient on pullback across aortic valve.   ANGIOGRAPHIC DATA.:  1.  LEFT MAIN:   Free of critical disease.  2.  LEFT ANTERIOR DESCENDING ARTERY:  Coursed into the apex. There is minor      luminal irregularity but no evidence of high-grade focal stenosis.  3.  CIRCUMFLEX:  Provides two marginal branches. There is minor luminal      irregularity but no significant focal stenosis.  4.  RIGHT CORONARY:  Totally occluded in its mid portion. Following      reperfusion, there is evidence of a ruptured plaque in the mid vessel.      This was treated with overlapping stents, the more distal stent is 23 x      40 revision stent and the more proximal stent is a 13 x 4.5 Ultra stent.      Both were dilated to 4.5 mm, with reduction of the stenosis from 100 to      0%. TIMI III flow was established. The distal vessel consisted of a      posterior descending system and a posterolateral system. There is no      significant focal narrowing, but the vessel was really sizeable.   LEFT VENTRICULOGRAM:  Done in the RAO projection; reveals ejection fraction  estimated at approximately 50%, with inferobasal hypokinesis.   CONCLUSIONS:  1.  Acute inferior wall my infarction, complicated by ventricular      arrhythmias.  2.  Successful reperfusion therapy using a 4.0 and 4.5 stents with post      dilatation of 4.5 mm.  3.  No critical disease of the left coronary system.   DISPOSITION:  The patient will need aspirin and Plavix on a long-term basis;  however, a non drug-eluding platform was utilized, so the duration can be  abbreviated.  Note, the patient does have a mild anemia on initial, with  elevated platelet count.  Therefore, this will need to be monitored. Follow-  up will be in [place], [place] with my colleagues.      TDS/MEDQ  D:  03/05/2005  T:  03/05/2005  Job:  [id number]   cc:   Annette Stable Hasanaj  [address]  [place]  [place] [address]  Fax: [phone]

## 2011-03-24 NOTE — Op Note (Signed)
NAME:  Stephen [name], Stephen [name] NO.:  [id number]   MEDICAL RECORD NO.:  [id number]                   PATIENT TYPE:  AMB   LOCATION:  ENDO                                 FACILITY:  [facility]   PHYSICIAN:  John C. Madilyn Fireman, M.D.                 DATE OF BIRTH:  [birth year]   DATE OF PROCEDURE:  11/04/2003  DATE OF DISCHARGE:                                 OPERATIVE REPORT   PROCEDURE:  Esophagogastroduodenoscopy with biopsy.   INDICATION FOR PROCEDURE:  Left-sided and epigastric abdominal pain and  chest pain with cardiac work-up negative to date.  Abdominal ultrasound  unrevealing and no real response to double dose Nexium or Librax.   DESCRIPTION OF PROCEDURE:  The patient was placed in the left lateral  decubitus position and placed on the pulse monitor with continuous low-flow  oxygen delivered by nasal cannula.  He was sedated with 100 mcg IV fentanyl  and 10 mg IV Versed.  The Olympus video endoscope was advanced under direct  vision into the oropharynx and esophagus.  The esophagus was straight and of  normal caliber with the squamocolumnar line at 38 cm.  There was no visible  hiatal hernia, ring, stricture, or other abnormality of the GE junction.  The stomach was entered; a small amount of liquid secretions were suctioned  from the fundus.  Retroflexed view of the cardia was unremarkable.  The  fundus, body, antrum, and pylorus all appeared normal.  The duodenum was  entered, and both the bulb and second portion were well-inspected and  appeared to be within normal limits.  The scope was withdrawn back into the  stomach and a CLOtest obtained.  The scope was then withdrawn and the  patient returned to the recovery room in stable condition.  He tolerated the  procedure well, and there were no immediate complications.   IMPRESSION:  Normal endoscopy.   PLAN:  Await CLOtest and treat for eradication if Helicobacter positive.  If  negative or no response, he may  warrant a CCK stimulated PIPIDA scan,  abdominal CT scan, or possibly, more thorough cardiac work-up.                                               John C. Madilyn Fireman, M.D.    JCH/MEDQ  D:  11/04/2003  T:  11/04/2003  Job:  [id number]   cc:   Ernestina Penna, M.D.  [address] [place]  [place] [address]  Fax: [phone]

## 2011-03-24 NOTE — Discharge Summary (Signed)
NAME:  Stephen [name], Stephen [name] NO.:  [id number]   MEDICAL RECORD NO.:  [id number]          PATIENT TYPE:  INP   LOCATION:  4728                         FACILITY:  [facility]   PHYSICIAN:  Charlies Constable, M.D. [facility] DATE OF BIRTH:  12-17-1960   DATE OF ADMISSION:  06/23/2005  DATE OF DISCHARGE:  06/26/2005                                 DISCHARGE SUMMARY   PRINCIPAL DIAGNOSIS:  Chest pain.   OTHER DIAGNOSES:  1.  Coronary artery disease, status post anterior myocardial infarction      complicated by ventricular fibrillation arrest in April 2006 with      percutaneous coronary intervention of the right coronary artery.  2.  Ischemic cardiomyopathy with an ejection fraction of 35%.  3.  Hypertension.  4.  Hyperlipidemia.   ALLERGIES:  No known drug allergies.   PROCEDURE:  Left heart cardiac catheterization.   PRIMARY CARDIOLOGIST:  Dr. Lewayne Bunting.   PRIMARY CARE PHYSICIAN:  [facility].   HISTORY OF PRESENT ILLNESS:  A 50 year old white male with prior history of  CAD, status post inferior MI in April 2006 complicated by VF arrest  requiring stenting for RCA. He has been having recurrent chest discomfort  and recently underwent a dobutamine stress echocardiogram by Dr. Andee Lineman on  June 15, 2005, which was negative for ischemia with an EF of 35%, with  inferior septal akinesia. Secondary to her current chest discomfort she  presented to [facility] on August 18th and ruled out, and  was subsequently transferred to [facility] for further evaluation.   HOSPITAL COURSE:  The patient arrived pain free and remained so over the  weekend. This morning he underwent a left heart cardiac catheterization  which showed patency of the previously placed stent in the right coronary  artery and otherwise insignificant coronary disease with an EF of 35% to 40%  with inferior hypokinesis. Post catheterization he has been ambulating  without  recurrent discomfort or difficulty and is being discharged home  today in satisfactory condition.   DISCHARGE LABORATORY:  Hemoglobin 13.0, hematocrit 37.7, WBC 0.3, platelet  count 229,000, MCV 88.3. Sodium 139, potassium 3.8, chloride 105, CO2 27,  BUN 13, creatinine 1.0, glucose 99. PT 14.2, INR 1.1, PTT 29. Total CK 70,  MB 1.3, troponin-I 0.03. Calcium 8.7.   DISPOSITION:  The patient is being discharged home in good condition. He has  a follow-up appointment with Dr. Lewayne Bunting at the [facility] next week. He is asked to follow up with his primary care Casson Catena at  [facility] within the next 3 to 4 weeks. He will  also follow up with Dr. Madilyn Fireman, GI.   OUTSTANDING LABORATORY STUDIES:  None.   DURATION OF DISCHARGE ENCOUNTER:  45 minutes.      Ok Anis, NP    ______________________________  Charlies Constable, M.D. [facility]    CRB/MEDQ  D:  06/26/2005  T:  06/26/2005  Job:  [id number]   cc:   Learta Codding, M.D. [facility]  [address]  [address]  [place]  [place] [address]   Everardo All. Madilyn Fireman, M.D.  [address]  [place]  [place] [address]  Fax: [phone]   [facility]

## 2011-03-24 NOTE — Consult Note (Signed)
NAME:  Stephen [name], Stephen [name] NO.:  [id number]   MEDICAL RECORD NO.:  [id number]          PATIENT TYPE:  INP   LOCATION:  2931                         FACILITY:  [facility]   PHYSICIAN:  Pramod P. Pearlean Brownie, MD    DATE OF BIRTH:  [birth year]   DATE OF CONSULTATION:  DATE OF DISCHARGE:                                   CONSULTATION   REFERRED BY:  Charlton Haws, M.D.   REASON FOR CONSULTATION:  Altered mental status.   HISTORY OF PRESENT ILLNESS:  Stephen [name] is a 67 year old young Caucasian male  who was admitted on March 04, 2005 with inferior wall myocardial infarction  with ventricular fibrillation at rest. He was shocked multiple times by EMS  before being brought to the hospital. He was found to be significantly  hypertensive. He has been to the cardiac cath lab and had good coronary  intervention done. Since then, his hospital course has been complicated by  staph aureus sepsis for which he is on antibiotics. He was extubated three  days ago but since then has remained disoriented, confused and  intermittently agitated as well as hallucinating. He has required high doses  of benzodiazepine and Haldol for sedation. There is no documented history of  seizure, focal extremity weakness or previous known neurological problems.   PAST MEDICAL HISTORY:  Significant for gastroesophageal reflux disease,  tobacco abuse.   PAST SURGICAL HISTORY:  Left knee surgery in 2001.   MEDICATIONS:  Home medications prior to this admission none.  Medications  during this hospitalization:  Plavix, aspirin, Lipitor, Capoten, Protonix,  nystatin, thiamine, folic acid, metoprolol, vitamin B12, iron sulfate,  Lasix, Avelox, Zofran, Ativan, Haldol.   SOCIAL HISTORY:  The patient lives with his wife, he smokes 2 1/2 packs per  day for 29 years, has 1-2 drinks of beer every day.   FAMILY HISTORY:  Noncontributory.   REVIEW OF SYMPTOMS:  As stated above.   PHYSICAL EXAMINATION:  GENERAL:  Reveals  an obese Caucasian young male whose  not in distress.  VITAL SIGNS:  He is afebrile. Pulse rate is 90 per minute today, blood  pressure is 102/70, sats 100% on 2 liters. Respiratory rate 20 per minute.  HEENT:  Head is atraumatic.  NECK:  Supple without bruit and the exam is unremarkable.  CARDIAC:  No murmur or gallop.  LUNGS:  Clear to auscultation.  NEUROLOGIC:  The patient is awake but quite lethargic. He opens eyes and  falls back to sleep. He can be aroused and follows only few commands before  becoming sleepy again. His speech is intermittently slurred but he can be  understood. He follows simple commands. He can recognize his wife and answer  to his name. He knows he is in [place] but does not know he is in the  hospital. He cannot tell me the date, month and year. He has diminished  attention span, __________ and recall. Eye movements full range without  nystagmus. He can track very well horizontally but is not very cooperative,  no vertical gaze. Pupils are irregular but reactive. Face is symmetric  bilateral. Movement is normal. Tongue is midline. Motor exam reveals good  voluntary strength in all four extremities against gravity. Strength testing  is limited due to lack of cooperation. There is no focal deficits I could  find. Reflexes are symmetric, plantar's are downgoing. Sensation is grossly  intact. Coordination and gait cannot be tested. There is no neck stiffness.   DATA REVIEWED:  Noncontrast CAT scan of the head done on March 05, 2005 is  unremarkable.   Labs today, WBC and complete metabolic panel unremarkable. Vitamin B12 is  low at 196 which has been replaced. Hemoglobin A1c is 5.9%.   IMPRESSION:  A 50 year old gentleman with recent myocardial infarction with  ventricular fibrillation at rest status post acute cardiac intervention who  has had altered mental status, agitation and hallucinations most likely from  delirium.  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   PLAN:  I would recommend minimizing CNS-active medications and changing the  Protonix and Avelox to more appropriate medication which do not cause  delirium. Stop the Ativan and Haldol. Start Seroquel 25 mg twice a day for  his agitation. Repeat CT scan of the head as well as check an EEG. I will  happy to followup with the patient in consults for any questions. I had a  long discussed with his wife and answered questions.      PPS/MEDQ  D:  03/13/2005  T:  03/13/2005  Job:  [id number]

## 2011-03-24 NOTE — Op Note (Signed)
NAME:  Stephen [name], Stephen [name] NO.:  [id number]   MEDICAL RECORD NO.:  [id number]          PATIENT TYPE:  INP   LOCATION:  6526                         FACILITY:  [facility]   PHYSICIAN:  Duke Salvia, M.D.  DATE OF BIRTH:  April 11, 1961   DATE OF PROCEDURE:  DATE OF DISCHARGE:                               OPERATIVE REPORT   PREOPERATIVE DIAGNOSIS:  Ischemic cardiomyopathy with depressed left  ventricular function.   POSTOPERATIVE DIAGNOSIS:  Ischemic cardiomyopathy with depressed left  ventricular function.   PROCEDURE:  Single chamber defibrillator implantation with  intraoperative defibrillation threshold testing.   Following obtaining informed consent, the patient was brought to the  electrophysiology laboratory and placed on the fluoroscopic table in the  supine position.  The patient was submitted to anesthesia under the care  of Dr. Jean Rosenthal.  After routine prep and drape, then lidocaine was  infiltrated in the prepectoral and subclavicular region.  Incision was  made and carried down to the layer of the prepectoral fascia using  electrocautery and sharp dissection.  A pocket was formed.  Similarly,  hemostasis was obtained.   Thereafter, attention was turned to gaining access of the extrathoracic  left subclavian vein which was accomplished without difficulty, without  the aspiration of air or puncture of the artery.  A guidewire was placed  and retained and a 7 French sheath was then placed over the retained  guide wire allowing for the subsequent passage of a Medtronic 6949 65 cm  dual coil active fixation defibrillator lead, serial #[id number].  Under  fluoroscopic guidance, it was manipulated in the right ventricular apex  with a bipolar R wave with 7.1 mV with a pace impedance of 1199 ohms and  a threshold of 1 V at 0.5 msec.  Current threshold was 1.1 mA and there  was no diaphragmatic pacing at 10 V.  This lead was then secured to the  prepectoral fascia  and attached to Medtronic Maximo 7232 CXICD, serial  #[id number].  Through the device a bipolar R wave was 5.4 mV with a pace  impedance of 925 ohms, a pacing threshold of 1.5 V at 0.2 msec and 1.0 V  at 0.3 msec, with a proximal coil impedance of 54 and a distal coil  impedance of 43.  With these acceptable parameters recorded, the lead  and the pulse generator were placed in the pocket and secured to the  prepectoral fascia, the pocket having been copiously irrigated with  antibiotic containing saline solution and hemostasis having been  obtained.  Defibrillation threshold testing was then undertaken.   Ventricular fibrillation was induced via the T wave shock.  After a  total duration of 6 seconds, a 15 joule shock was delivered through a  measured resistance of 40 ohms, terminated ventricular fibrillation  restoring sinus rhythm.  After a wait of 5 to 6 minutes, ventricular  fibrillation was re-induced via the T wave shock.  After a total  duration of 6 seconds, a 15 joule shock was again delivered through a  measured resistance of 40 ohms termination ventricular fibrillation  restoring sinus rhythm.  At this point, the device was implanted.  The  pocket was closed in three layers in  the normal fashion.  The wound was washed and dried and a Benzoin and  Steri-Strips dressing was applied.  Needle counts, sponge counts, and  instrument counts were correct at the end of the procedure according to  the staff.  The patient tolerated the procedure without apparent  complication.           ______________________________  Duke Salvia, M.D.     SCK/MEDQ  D:  08/28/2005  T:  08/29/2005  Job:  [id number]   cc:   Electrophysiology Laboratory   [facility]   Learta Codding, M.D. [facility]  [address]  [address]  [place]  [place] [address]   [facility]

## 2011-04-06 ENCOUNTER — Other Ambulatory Visit: Payer: Self-pay

## 2011-04-06 ENCOUNTER — Ambulatory Visit (INDEPENDENT_AMBULATORY_CARE_PROVIDER_SITE_OTHER): Payer: Medicare Other | Admitting: *Deleted

## 2011-04-06 DIAGNOSIS — I428 Other cardiomyopathies: Secondary | ICD-10-CM

## 2011-04-12 NOTE — Progress Notes (Signed)
ICD REMOTE  

## 2011-05-07 ENCOUNTER — Encounter: Payer: Self-pay | Admitting: *Deleted

## 2011-07-26 LAB — POCT CARDIAC MARKERS
CKMB, poc: <1 — ABNORMAL LOW
CKMB, poc: <1 — ABNORMAL LOW
CKMB, poc: <1 — ABNORMAL LOW
Myoglobin, poc: 75.1
Operator id: 208461
Troponin i, poc: <0.05
Troponin i, poc: <0.05

## 2011-07-26 LAB — BASIC METABOLIC PANEL
Calcium: 8.6
Creatinine, Ser: 0.98
GFR calc Af Amer: >60
GFR calc non Af Amer: >60
Glucose, Bld: 99
Sodium: 136

## 2011-07-26 LAB — DIFFERENTIAL
Basophils Absolute: 0.1
Lymphocytes Relative: 32
Monocytes Absolute: 0.9
Neutro Abs: 5.3
Neutrophils Relative %: 53

## 2011-07-26 LAB — CBC
Hemoglobin: 13.4
RDW: 16 — ABNORMAL HIGH

## 2011-08-04 ENCOUNTER — Encounter: Payer: Self-pay | Admitting: Internal Medicine

## 2011-08-08 ENCOUNTER — Encounter: Payer: Self-pay | Admitting: Internal Medicine

## 2011-08-08 ENCOUNTER — Ambulatory Visit (INDEPENDENT_AMBULATORY_CARE_PROVIDER_SITE_OTHER): Payer: Medicare Other | Admitting: Internal Medicine

## 2011-08-08 DIAGNOSIS — T827XXA Infection and inflammatory reaction due to other cardiac and vascular devices, implants and grafts, initial encounter: Secondary | ICD-10-CM

## 2011-08-08 DIAGNOSIS — I2589 Other forms of chronic ischemic heart disease: Secondary | ICD-10-CM

## 2011-08-08 DIAGNOSIS — I472 Ventricular tachycardia: Secondary | ICD-10-CM | POA: Insufficient documentation

## 2011-08-08 DIAGNOSIS — T82198A Other mechanical complication of other cardiac electronic device, initial encounter: Secondary | ICD-10-CM | POA: Insufficient documentation

## 2011-08-08 DIAGNOSIS — Z95810 Presence of automatic (implantable) cardiac defibrillator: Secondary | ICD-10-CM

## 2011-08-08 DIAGNOSIS — T83511A Infection and inflammatory reaction due to indwelling urethral catheter, initial encounter: Secondary | ICD-10-CM

## 2011-08-08 DIAGNOSIS — I10 Essential (primary) hypertension: Secondary | ICD-10-CM

## 2011-08-08 DIAGNOSIS — I2510 Atherosclerotic heart disease of native coronary artery without angina pectoris: Secondary | ICD-10-CM

## 2011-08-08 NOTE — Assessment & Plan Note (Signed)
No recurrences. He has had inappropriate therapy and we have reprogrammed his device

## 2011-08-08 NOTE — Assessment & Plan Note (Signed)
The patient's device was interrogated and the information was fully reviewed.  The device was reprogrammed to hopefully decrease the likelihood of inappropriate therapy delivered for sinus tachycardia

## 2011-08-08 NOTE — Progress Notes (Signed)
  HPI  Stephen [name] is a 50 y.o. male seen in followup for ICD implanted initially for primary prevention in the setting of ischemic heart disease.He has prior stenting of his RCA.He has had appropriate therapy for VT./VF Iin July of 08. The cycle length was 200 ms or so  He has had no recurrences.  He has minimal shortness of breath and minimal stable nonexertional chest pain. He has had no edema.  Past Medical History  Diagnosis Date  . Obesity   . Hypertension   . Hyperlipidemia   . Systolic heart failure   . Coronary artery disease     native vessel  . AICD (automatic cardioverter/defibrillator) present 2006    Medtronic Maximo 7232  . Cardiomyopathy     ischemic  . Chest pain   . OSA (obstructive sleep apnea)     Past Surgical History  Procedure Date  . Cardiac defibrillator placement 2006    Medtronic Maximo 7232    Current Outpatient Prescriptions  Medication Sig Dispense Refill  . bisoprolol (ZEBETA) 10 MG tablet Take 10 mg by mouth daily.        . cholecalciferol (VITAMIN D) 1000 UNITS tablet Take 1,000 Units by mouth daily.        . diazepam (VALIUM) 5 MG tablet Take 5 mg by mouth. One in the morning and two in the evening       . divalproex (DEPAKOTE ER) 500 MG 24 hr tablet Take 500 mg by mouth daily.        . enalapril (VASOTEC) 10 MG tablet Take 10 mg by mouth daily.        . furosemide (LASIX) 20 MG tablet Take 20 mg by mouth daily.        . mometasone (NASONEX) 50 MCG/ACT nasal spray Place 2 sprays into the nose daily.        . rosuvastatin (CRESTOR) 5 MG tablet Take 5 mg by mouth every other day.       . warfarin (COUMADIN) 5 MG tablet Take 5 mg by mouth daily. 2.8 last check INR        No Known Allergies  Review of Systems negative except from HPI and PMH  Physical Exam Well developed and well nourished in no acute distress HENT normal E scleral and icterus [name] Neck Supple JVP flat; carotids brisk and full [name] to ausculation Regular rate  and rhythm, no murmurs gallops or rub Soft with active bowel sounds No clubbing cyanosis and edema Alert and oriented, grossly normal motor and sensory function Skin Warm and Dry  ECG  Assessment and  Plan

## 2011-08-08 NOTE — Assessment & Plan Note (Signed)
Have reviewed recommendations regarding 6949 lead fractures. He articulates understanding

## 2011-08-08 NOTE — Assessment & Plan Note (Signed)
Stable continue current medications

## 2011-08-08 NOTE — Patient Instructions (Signed)

## 2011-08-08 NOTE — Assessment & Plan Note (Signed)
His blood pressure is elevated today; however, he tells that when he gets his blood work checked with his PCP it's in the 120/80 range

## 2011-11-09 ENCOUNTER — Encounter: Payer: Medicare Other | Admitting: *Deleted

## 2011-11-13 ENCOUNTER — Encounter: Payer: Self-pay | Admitting: *Deleted

## 2011-11-15 ENCOUNTER — Other Ambulatory Visit: Payer: Self-pay | Admitting: Internal Medicine

## 2011-11-15 ENCOUNTER — Ambulatory Visit (INDEPENDENT_AMBULATORY_CARE_PROVIDER_SITE_OTHER): Payer: Medicare Other | Admitting: *Deleted

## 2011-11-15 ENCOUNTER — Encounter: Payer: Self-pay | Admitting: Internal Medicine

## 2011-11-15 DIAGNOSIS — I428 Other cardiomyopathies: Secondary | ICD-10-CM

## 2011-11-15 DIAGNOSIS — I472 Ventricular tachycardia: Secondary | ICD-10-CM

## 2011-11-19 LAB — REMOTE ICD DEVICE
CHARGE TIME: 8.97 s
RV LEAD IMPEDENCE ICD: 472 Ohm
TZAT-0004FASTVT: 8
TZAT-0004SLOWVT: 8
TZAT-0004SLOWVT: 8
TZAT-0005FASTVT: 88 pct
TZAT-0005SLOWVT: 84 pct
TZAT-0005SLOWVT: 91 pct
TZAT-0011FASTVT: 10 ms
TZAT-0011SLOWVT: 10 ms
TZAT-0011SLOWVT: 10 ms
TZAT-0012SLOWVT: 200 ms
TZAT-0012SLOWVT: 200 ms
TZAT-0013FASTVT: 1
TZAT-0013SLOWVT: 2
TZAT-0013SLOWVT: 2
TZAT-0018FASTVT: NEGATIVE
TZON-0003FASTVT: 240 ms
TZON-0003SLOWVT: 300 ms
TZON-0004SLOWVT: 48
TZON-0011AFLUTTER: 70
TZST-0001FASTVT: 3
TZST-0001FASTVT: 5
TZST-0001SLOWVT: 5
TZST-0001SLOWVT: 6
TZST-0003FASTVT: 35 J
TZST-0003SLOWVT: 25 J
TZST-0003SLOWVT: 35 J
TZST-0003SLOWVT: 35 J

## 2011-11-23 NOTE — Progress Notes (Signed)
Remote defib check  

## 2011-11-28 ENCOUNTER — Encounter: Payer: Self-pay | Admitting: *Deleted

## 2012-02-15 ENCOUNTER — Encounter: Payer: Self-pay | Admitting: Internal Medicine

## 2012-02-15 ENCOUNTER — Ambulatory Visit (INDEPENDENT_AMBULATORY_CARE_PROVIDER_SITE_OTHER): Payer: Medicare Other | Admitting: *Deleted

## 2012-02-15 DIAGNOSIS — I472 Ventricular tachycardia: Secondary | ICD-10-CM

## 2012-02-15 DIAGNOSIS — Z95810 Presence of automatic (implantable) cardiac defibrillator: Secondary | ICD-10-CM

## 2012-02-19 LAB — REMOTE ICD DEVICE
BATTERY VOLTAGE: 2.96 V
CHARGE TIME: 8.97 s
DEV-0020ICD: NEGATIVE
RV LEAD IMPEDENCE ICD: 456 Ohm
TOT-0006: 20130109000000
TZAT-0001SLOWVT: 1
TZAT-0001SLOWVT: 2
TZAT-0005SLOWVT: 84 pct
TZAT-0005SLOWVT: 91 pct
TZAT-0011FASTVT: 10 ms
TZAT-0011SLOWVT: 10 ms
TZAT-0011SLOWVT: 10 ms
TZAT-0012FASTVT: 200 ms
TZAT-0013FASTVT: 1
TZAT-0013SLOWVT: 2
TZAT-0013SLOWVT: 2
TZAT-0018FASTVT: NEGATIVE
TZAT-0018SLOWVT: NEGATIVE
TZAT-0018SLOWVT: NEGATIVE
TZAT-0019FASTVT: 8 V
TZAT-0020FASTVT: 1.6 ms
TZON-0003FASTVT: 240 ms
TZON-0004SLOWVT: 48
TZON-0005SLOWVT: 24
TZON-0008FASTVT: 0 ms
TZON-0008SLOWVT: 0 ms
TZST-0001FASTVT: 3
TZST-0001FASTVT: 6
TZST-0001SLOWVT: 4
TZST-0001SLOWVT: 6
TZST-0003FASTVT: 35 J
TZST-0003FASTVT: 35 J
TZST-0003FASTVT: 35 J
TZST-0003SLOWVT: 35 J
VENTRICULAR PACING ICD: 0 pct

## 2012-02-22 NOTE — Progress Notes (Signed)
Remote icd check  

## 2012-03-05 ENCOUNTER — Encounter: Payer: Self-pay | Admitting: *Deleted

## 2012-05-23 ENCOUNTER — Encounter: Payer: Medicare Other | Admitting: *Deleted

## 2012-05-31 ENCOUNTER — Encounter: Payer: Self-pay | Admitting: *Deleted

## 2012-06-06 ENCOUNTER — Telehealth: Payer: Self-pay | Admitting: Internal Medicine

## 2012-06-06 NOTE — Telephone Encounter (Signed)
Pt made an appt with Dr. Graciela Husbands and he wanted to let us know that he did not get a confirmation from his last transmission in April so that is why he did not send the one that was due 7/18 but he will send it in the next 24hrs

## 2012-06-17 ENCOUNTER — Ambulatory Visit (INDEPENDENT_AMBULATORY_CARE_PROVIDER_SITE_OTHER): Payer: Medicare Other | Admitting: *Deleted

## 2012-06-17 ENCOUNTER — Encounter: Payer: Self-pay | Admitting: Internal Medicine

## 2012-06-17 DIAGNOSIS — I472 Ventricular tachycardia, unspecified: Secondary | ICD-10-CM

## 2012-06-17 DIAGNOSIS — Z95810 Presence of automatic (implantable) cardiac defibrillator: Secondary | ICD-10-CM

## 2012-06-21 LAB — REMOTE ICD DEVICE
DEV-0020ICD: NEGATIVE
RV LEAD AMPLITUDE: 14.8 mv
TOT-0006: 20130413000000
TZAT-0001FASTVT: 1
TZAT-0004FASTVT: 8
TZAT-0012SLOWVT: 200 ms
TZAT-0012SLOWVT: 200 ms
TZAT-0013FASTVT: 1
TZAT-0018FASTVT: NEGATIVE
TZAT-0018SLOWVT: NEGATIVE
TZAT-0018SLOWVT: NEGATIVE
TZAT-0019FASTVT: 8 V
TZAT-0019SLOWVT: 8 V
TZAT-0019SLOWVT: 8 V
TZAT-0020FASTVT: 1.6 ms
TZAT-0020SLOWVT: 1.6 ms
TZAT-0020SLOWVT: 1.6 ms
TZON-0005SLOWVT: 24
TZON-0008SLOWVT: 0 ms
TZON-0011AFLUTTER: 70
TZST-0001FASTVT: 2
TZST-0001FASTVT: 4
TZST-0001SLOWVT: 4
TZST-0001SLOWVT: 5
TZST-0003FASTVT: 35 J
TZST-0003FASTVT: 35 J
TZST-0003SLOWVT: 25 J
TZST-0003SLOWVT: 35 J

## 2012-07-09 ENCOUNTER — Encounter: Payer: Self-pay | Admitting: *Deleted

## 2012-08-13 ENCOUNTER — Encounter: Payer: Self-pay | Admitting: Internal Medicine

## 2012-08-13 ENCOUNTER — Ambulatory Visit (INDEPENDENT_AMBULATORY_CARE_PROVIDER_SITE_OTHER): Payer: Medicare Other | Admitting: Internal Medicine

## 2012-08-13 VITALS — BP 137/96 | HR 76 | Ht 72.0 in | Wt 303.0 lb

## 2012-08-13 DIAGNOSIS — T82198A Other mechanical complication of other cardiac electronic device, initial encounter: Secondary | ICD-10-CM

## 2012-08-13 DIAGNOSIS — I472 Ventricular tachycardia, unspecified: Secondary | ICD-10-CM

## 2012-08-13 DIAGNOSIS — I5022 Chronic systolic (congestive) heart failure: Secondary | ICD-10-CM

## 2012-08-13 DIAGNOSIS — Z95810 Presence of automatic (implantable) cardiac defibrillator: Secondary | ICD-10-CM

## 2012-08-13 DIAGNOSIS — I10 Essential (primary) hypertension: Secondary | ICD-10-CM

## 2012-08-13 LAB — ICD DEVICE OBSERVATION
BRDY-0002RV: 40 {beats}/min
DEV-0020ICD: NEGATIVE
RV LEAD AMPLITUDE: 13 mv
RV LEAD IMPEDENCE ICD: 456 Ohm
TZAT-0004FASTVT: 8
TZAT-0004SLOWVT: 8
TZAT-0004SLOWVT: 8
TZAT-0005FASTVT: 88 pct
TZAT-0005SLOWVT: 84 pct
TZAT-0005SLOWVT: 91 pct
TZAT-0011SLOWVT: 10 ms
TZAT-0011SLOWVT: 10 ms
TZAT-0012FASTVT: 200 ms
TZAT-0013FASTVT: 1
TZAT-0019SLOWVT: 8 V
TZAT-0020FASTVT: 1.6 ms
TZON-0003SLOWVT: 300 ms
TZON-0011AFLUTTER: 70
TZST-0001FASTVT: 2
TZST-0001FASTVT: 4
TZST-0001FASTVT: 5
TZST-0001SLOWVT: 4
TZST-0003FASTVT: 35 J
TZST-0003FASTVT: 35 J
TZST-0003SLOWVT: 25 J
TZST-0003SLOWVT: 35 J

## 2012-08-13 MED ORDER — SPIRONOLACTONE 25 MG PO TABS: 25.0000 mg | ORAL_TABLET | Freq: Every day | ORAL | Status: DC

## 2012-08-13 NOTE — Assessment & Plan Note (Signed)
Lead currently stable

## 2012-08-13 NOTE — Progress Notes (Signed)
Patient has no care team.   HPI  Stephen [name] is a 51 y.o. male  seen in followup for ICD implanted initially for primary prevention in the setting of ischemic heart disease.He has prior stenting of his RCA.He has had appropriate therapy for VT./VF Iin July of 08. The cycle length was 200 ms or so . \ Denies signif worsening of baseline sob  Has mild edema  Was started on amlodipine a few  months ago.  He has known sleep apnea but has not been using therapy because of mask intolerance. He is currently disabled and working at home   Last measured ejection fraction was MRI 2006 yes 28%  Past Medical History  Diagnosis Date  . Obesity   . Hypertension   . Hyperlipidemia   . Systolic heart failure   . Coronary artery disease     native vessel  . AICD (automatic cardioverter/defibrillator) present 2006    Medtronic Maximo 7232  . Cardiomyopathy     ischemic  . Chest pain   . OSA (obstructive sleep apnea)     Past Surgical History  Procedure Date  . Cardiac defibrillator placement 2006    Medtronic Maximo 7232    Current Outpatient Prescriptions  Medication Sig Dispense Refill  . bisoprolol (ZEBETA) 10 MG tablet Take 10 mg by mouth daily.        . cholecalciferol (VITAMIN D) 1000 UNITS tablet Take 1,000 Units by mouth daily.        . diazepam (VALIUM) 5 MG tablet Take 5 mg by mouth. One in the morning and two in the evening       . divalproex (DEPAKOTE ER) 500 MG 24 hr tablet Take 500 mg by mouth daily.        . enalapril (VASOTEC) 10 MG tablet Take 10 mg by mouth daily.        . furosemide (LASIX) 20 MG tablet Take 20 mg by mouth daily.        . mometasone (NASONEX) 50 MCG/ACT nasal spray Place 2 sprays into the nose daily.        . rosuvastatin (CRESTOR) 5 MG tablet Take 5 mg by mouth every other day.       . warfarin (COUMADIN) 5 MG tablet Take 5 mg by mouth daily. 2.8 last check INR        No Known Allergies  Review of Systems negative except from HPI and  PMH  Physical Exam BP 137/96  Pulse 76  Ht 6' (1.829 m)  Wt 303 lb (137.44 kg)  BMI 41.09 kg/m2 Well developed and well nourished in no acute distress HENT normal E scleral and icterus clear Neck Supple JVP flat; carotids brisk and full Clear to ausculation Regular rate and rhythm, no murmurs gallops or rub Soft with active bowel sounds No clubbing cyanosis 1+ Edema Alert and oriented, grossly normal motor and sensory function Skin Warm and Dry  Electrocardiogram demonstrates sinus rhythm at 80 Intervals 16/10/40 Axis XXXV Nonspecific T wave changes Inferior  Assessment and  Plan

## 2012-08-13 NOTE — Assessment & Plan Note (Signed)
Is elevated today. Rather than start him on more medication, however, I have encouraged him to followup with his sleep apnea therapy. This may have a major impact on blood pressure and avoid the need of further up titration of apart from the changes noted above

## 2012-08-13 NOTE — Assessment & Plan Note (Signed)
Has some edema. His heart assessment neck veins. We will check his echo and if his LV function remains depressed we will discontinue his amlodipine and begin him on Aldactone which should help both with edema as well as hard endpoint

## 2012-08-13 NOTE — Assessment & Plan Note (Signed)
He has ischemic cardiomyopathy with stable symptoms. We need reassessment of left ventricular function to decide whether to begin him on aldosterone antagonist therapy.

## 2012-08-13 NOTE — Assessment & Plan Note (Signed)
Intercurrent nonsustained VT

## 2012-08-13 NOTE — Assessment & Plan Note (Signed)
The patient's device was interrogated.  The information was reviewed. No changes were made in the programming.    

## 2012-08-13 NOTE — Patient Instructions (Addendum)
Remote monitoring is used to monitor your Pacemaker of ICD from home. This monitoring reduces the number of office visits required to check your device to one time per year. It allows [place] to keep an eye on the functioning of your device to ensure it is working properly. You are scheduled for a device check from home on November 18, 2012. You may send your transmission at any time that day. If you have a wireless device, the transmission will be sent automatically. After your physician reviews your transmission, you will receive a postcard with your next transmission date.  Your physician wants you to follow-up in: 1 year with Dr Graciela Husbands.  You will receive a reminder letter in the mail two months in advance. If you don't receive a letter, please call our office to schedule the follow-up appointment.  Your physician has requested that you have an echocardiogram at [facility]. Echocardiography is a painless test that uses sound waves to create images of your heart. It provides your doctor with information about the size and shape of your heart and how well your heart's chambers and valves are working. This procedure takes approximately one hour. There are no restrictions for this procedure.

## 2012-08-21 ENCOUNTER — Other Ambulatory Visit (INDEPENDENT_AMBULATORY_CARE_PROVIDER_SITE_OTHER): Payer: Medicare Other

## 2012-08-21 ENCOUNTER — Other Ambulatory Visit: Payer: Medicare Other

## 2012-08-21 ENCOUNTER — Other Ambulatory Visit: Payer: Self-pay

## 2012-08-21 DIAGNOSIS — I472 Ventricular tachycardia: Secondary | ICD-10-CM

## 2012-08-21 DIAGNOSIS — I5022 Chronic systolic (congestive) heart failure: Secondary | ICD-10-CM

## 2012-08-21 DIAGNOSIS — I2589 Other forms of chronic ischemic heart disease: Secondary | ICD-10-CM

## 2012-09-04 NOTE — Progress Notes (Signed)
Notified. 

## 2012-11-18 ENCOUNTER — Encounter: Payer: Medicare Other | Admitting: *Deleted

## 2012-11-25 ENCOUNTER — Encounter: Payer: Self-pay | Admitting: *Deleted

## 2012-11-25 ENCOUNTER — Ambulatory Visit (INDEPENDENT_AMBULATORY_CARE_PROVIDER_SITE_OTHER): Payer: Medicare PPO | Admitting: *Deleted

## 2012-11-25 DIAGNOSIS — I472 Ventricular tachycardia, unspecified: Secondary | ICD-10-CM

## 2012-11-25 DIAGNOSIS — Z95810 Presence of automatic (implantable) cardiac defibrillator: Secondary | ICD-10-CM

## 2012-11-29 LAB — REMOTE ICD DEVICE
DEV-0020ICD: NEGATIVE
RV LEAD AMPLITUDE: 13.9 mv
RV LEAD IMPEDENCE ICD: 488 Ohm
TOT-0006: 20131008000000
TZAT-0001FASTVT: 1
TZAT-0001SLOWVT: 1
TZAT-0001SLOWVT: 2
TZAT-0004FASTVT: 8
TZAT-0004SLOWVT: 8
TZAT-0005FASTVT: 88 pct
TZAT-0012SLOWVT: 200 ms
TZAT-0012SLOWVT: 200 ms
TZAT-0013FASTVT: 1
TZAT-0018FASTVT: NEGATIVE
TZAT-0019SLOWVT: 8 V
TZAT-0019SLOWVT: 8 V
TZAT-0020FASTVT: 1.6 ms
TZAT-0020SLOWVT: 1.6 ms
TZAT-0020SLOWVT: 1.6 ms
TZON-0003FASTVT: 240 ms
TZON-0008SLOWVT: 0 ms
TZST-0001FASTVT: 2
TZST-0001FASTVT: 4
TZST-0001FASTVT: 6
TZST-0001SLOWVT: 3
TZST-0001SLOWVT: 5
TZST-0003FASTVT: 35 J
TZST-0003FASTVT: 35 J
TZST-0003FASTVT: 35 J
TZST-0003SLOWVT: 25 J
TZST-0003SLOWVT: 35 J
TZST-0003SLOWVT: 35 J

## 2012-12-03 ENCOUNTER — Encounter: Payer: Self-pay | Admitting: *Deleted

## 2012-12-12 ENCOUNTER — Encounter: Payer: Self-pay | Admitting: Internal Medicine

## 2013-02-24 ENCOUNTER — Ambulatory Visit (INDEPENDENT_AMBULATORY_CARE_PROVIDER_SITE_OTHER): Payer: Medicare PPO | Admitting: *Deleted

## 2013-02-24 DIAGNOSIS — I472 Ventricular tachycardia, unspecified: Secondary | ICD-10-CM

## 2013-02-24 DIAGNOSIS — Z95810 Presence of automatic (implantable) cardiac defibrillator: Secondary | ICD-10-CM

## 2013-02-26 ENCOUNTER — Other Ambulatory Visit: Payer: Self-pay

## 2013-03-09 LAB — REMOTE ICD DEVICE
BRDY-0002RV: 40 {beats}/min
CHARGE TIME: 10.03 s
DEV-0020ICD: NEGATIVE
TZAT-0001SLOWVT: 1
TZAT-0001SLOWVT: 2
TZAT-0004SLOWVT: 8
TZAT-0004SLOWVT: 8
TZAT-0005FASTVT: 88 pct
TZAT-0005SLOWVT: 84 pct
TZAT-0005SLOWVT: 91 pct
TZAT-0011FASTVT: 10 ms
TZAT-0019FASTVT: 8 V
TZAT-0020SLOWVT: 1.6 ms
TZON-0004SLOWVT: 48
TZST-0001FASTVT: 3
TZST-0001FASTVT: 6
TZST-0001SLOWVT: 3
TZST-0001SLOWVT: 5
TZST-0001SLOWVT: 6
TZST-0003FASTVT: 35 J
TZST-0003FASTVT: 35 J
TZST-0003FASTVT: 35 J
TZST-0003SLOWVT: 35 J

## 2013-03-31 ENCOUNTER — Encounter (HOSPITAL_COMMUNITY): Payer: Self-pay

## 2013-03-31 ENCOUNTER — Emergency Department (HOSPITAL_COMMUNITY): Payer: Medicare PPO

## 2013-03-31 ENCOUNTER — Emergency Department (HOSPITAL_COMMUNITY)
Admission: EM | Admit: 2013-03-31 | Discharge: 2013-03-31 | Disposition: A | Discharge status: Home / Self Care | Payer: Medicare PPO | Attending: Emergency Medicine | Admitting: Emergency Medicine

## 2013-03-31 DIAGNOSIS — I472 Ventricular tachycardia, unspecified: Secondary | ICD-10-CM | POA: Insufficient documentation

## 2013-03-31 DIAGNOSIS — I4729 Other ventricular tachycardia: Secondary | ICD-10-CM | POA: Insufficient documentation

## 2013-03-31 DIAGNOSIS — I2510 Atherosclerotic heart disease of native coronary artery without angina pectoris: Secondary | ICD-10-CM | POA: Insufficient documentation

## 2013-03-31 DIAGNOSIS — Z79899 Other long term (current) drug therapy: Secondary | ICD-10-CM | POA: Insufficient documentation

## 2013-03-31 DIAGNOSIS — Z7901 Long term (current) use of anticoagulants: Secondary | ICD-10-CM | POA: Insufficient documentation

## 2013-03-31 DIAGNOSIS — E785 Hyperlipidemia, unspecified: Secondary | ICD-10-CM | POA: Insufficient documentation

## 2013-03-31 DIAGNOSIS — IMO0002 Reserved for concepts with insufficient information to code with codable children: Secondary | ICD-10-CM | POA: Insufficient documentation

## 2013-03-31 DIAGNOSIS — G4733 Obstructive sleep apnea (adult) (pediatric): Secondary | ICD-10-CM | POA: Insufficient documentation

## 2013-03-31 DIAGNOSIS — I5020 Unspecified systolic (congestive) heart failure: Secondary | ICD-10-CM | POA: Insufficient documentation

## 2013-03-31 DIAGNOSIS — R609 Edema, unspecified: Secondary | ICD-10-CM | POA: Insufficient documentation

## 2013-03-31 DIAGNOSIS — I10 Essential (primary) hypertension: Secondary | ICD-10-CM | POA: Insufficient documentation

## 2013-03-31 DIAGNOSIS — E669 Obesity, unspecified: Secondary | ICD-10-CM | POA: Insufficient documentation

## 2013-03-31 DIAGNOSIS — Z86718 Personal history of other venous thrombosis and embolism: Secondary | ICD-10-CM | POA: Insufficient documentation

## 2013-03-31 DIAGNOSIS — Z95810 Presence of automatic (implantable) cardiac defibrillator: Secondary | ICD-10-CM | POA: Insufficient documentation

## 2013-03-31 DIAGNOSIS — Z8679 Personal history of other diseases of the circulatory system: Secondary | ICD-10-CM | POA: Insufficient documentation

## 2013-03-31 DIAGNOSIS — R079 Chest pain, unspecified: Secondary | ICD-10-CM | POA: Insufficient documentation

## 2013-03-31 DIAGNOSIS — R5381 Other malaise: Secondary | ICD-10-CM | POA: Insufficient documentation

## 2013-03-31 DIAGNOSIS — F17200 Nicotine dependence, unspecified, uncomplicated: Secondary | ICD-10-CM | POA: Insufficient documentation

## 2013-03-31 LAB — CBC WITH DIFFERENTIAL/PLATELET
Basophils Absolute: 0.1 10*3/uL (ref 0.0–0.1)
HCT: 42.3 % (ref 39.0–52.0)
Lymphocytes Relative: 46 % (ref 12–46)
Lymphs Abs: 2.1 10*3/uL (ref 0.7–4.0)
MCV: 95.3 fL (ref 78.0–100.0)
Monocytes Absolute: 0.8 10*3/uL (ref 0.1–1.0)
Neutro Abs: 1.4 10*3/uL — ABNORMAL LOW (ref 1.7–7.7)
Platelets: 183 10*3/uL (ref 150–400)
RBC: 4.44 MIL/uL (ref 4.22–5.81)
RDW: 16 % — ABNORMAL HIGH (ref 11.5–15.5)
WBC: 4.6 10*3/uL (ref 4.0–10.5)

## 2013-03-31 LAB — COMPREHENSIVE METABOLIC PANEL
ALT: 73 U/L — ABNORMAL HIGH (ref 0–53)
AST: 117 U/L — ABNORMAL HIGH (ref 0–37)
CO2: 30 mEq/L (ref 19–32)
Chloride: 99 mEq/L (ref 96–112)
GFR calc Af Amer: >90 mL/min (ref >90)
GFR calc non Af Amer: 82 mL/min — ABNORMAL LOW (ref >90)
Glucose, Bld: 102 mg/dL — ABNORMAL HIGH (ref 70–99)
Sodium: 141 mEq/L (ref 135–145)
Total Bilirubin: 0.5 mg/dL (ref 0.3–1.2)

## 2013-03-31 MED ORDER — FUROSEMIDE 10 MG/ML IJ SOLN
40.0000 mg | Freq: Once | INTRAMUSCULAR | Status: AC
  Administered 2013-03-31: 40 mg via INTRAVENOUS
  Filled 2013-03-31: qty 4

## 2013-03-31 NOTE — ED Notes (Signed)
Pt reports leg swelling bilaterally for the past two days.  Pt reports his pcp starting him on xarelto 3 weeks ago and thinks that this is what is causing his swelling.  Pt denies any swelling to his throat or any difficulty swallowing.

## 2013-03-31 NOTE — ED Provider Notes (Signed)
History    This chart was scribed for [name], MD by Leone Payor, ED Scribe. This patient was seen in room APA04/APA04 and the patient's care was started 11:47 AM.   CSN: [id number]  Arrival date & time 03/31/13  4098   First MD Initiated Contact with Patient 03/31/13 1135      Chief Complaint  Patient presents with  . Leg Swelling     The history is provided by the patient and the spouse. No language interpreter was used.    HPI Comments: Stephen [name] is a 52 y.o. male who presents to the Emergency Department complaining of ongoing, constant, bilateral leg swelling that started 2 days ago. He has associated generalized weakness (which he has at baseline), mild sternal chest pain  Pt reports his PCP started him on Xarelto 3 weeks ago. He has a defibrillator placed. He denies SOB. Pt has h/o DVT, HTN, HLD, systolic heart failure, CAD.     Past Medical History  Diagnosis Date  . Obesity   . Hypertension   . Hyperlipidemia   . Systolic heart failure   . Coronary artery disease     native vessel  . AICD (automatic cardioverter/defibrillator) present 2006    [employer] Maximo 7232  . Ischemic cardiomyopathy     ischemic  . OSA (obstructive sleep apnea)   . 1191 Lead   . Paroxysmal ventricular tachycardia     Rx via ATP 2008    Past Surgical History  Procedure Laterality Date  . Cardiac defibrillator placement  2006    [employer] Maximo 7232    No family history on file.  History  Substance Use Topics  . Smoking status: Current Every Day Smoker -- 1.00 packs/day    Types: Cigarettes  . Smokeless tobacco: Never Used  . Alcohol Use: Yes     Comment: socially      Review of Systems  Constitutional: Negative for appetite change and fatigue.  HENT: Negative for congestion, sinus pressure and ear discharge.   Eyes: Negative for discharge.  Respiratory: Negative for cough.   Cardiovascular: Positive for chest pain and leg swelling.  Gastrointestinal:  Negative for abdominal pain and diarrhea.  Genitourinary: Negative for frequency and hematuria.  Musculoskeletal: Negative for back pain.  Skin: Negative for rash.  Neurological: Positive for weakness (generalized). Negative for seizures and headaches.  Psychiatric/Behavioral: Negative for hallucinations.    Allergies  Review of patient's allergies indicates no known allergies.  Home Medications   Current Outpatient Rx  Name  Route  Sig  Dispense  Refill  . amLODipine (NORVASC) 5 MG tablet   Oral   Take 5 mg by mouth daily.         . bisoprolol (ZEBETA) 10 MG tablet   Oral   Take 10 mg by mouth daily.           . cholecalciferol (VITAMIN D) 1000 UNITS tablet   Oral   Take 1,000 Units by mouth daily.           . diazepam (VALIUM) 5 MG tablet   Oral   Take 5-10 mg by mouth 2 (two) times daily. One in the morning and two in the evening. Pt may also take 1 extra tablet at bedtime if needed sleep.         . divalproex (DEPAKOTE ER) 500 MG 24 hr tablet   Oral   Take 500 mg by mouth at bedtime.          Marland Kitchen  enalapril (VASOTEC) 10 MG tablet   Oral   Take 10 mg by mouth daily.           . furosemide (LASIX) 20 MG tablet   Oral   Take 40 mg by mouth daily.          Marland Kitchen HYDROcodone-acetaminophen (NORCO/VICODIN) 5-325 MG per tablet   Oral   Take 1 tablet by mouth 2 (two) times daily.         . mometasone (NASONEX) 50 MCG/ACT nasal spray   Nasal   Place 2 sprays into the nose daily.           . nitroGLYCERIN (NITROSTAT) 0.4 MG SL tablet   Sublingual   Place 0.4 mg under the tongue every 5 (five) minutes as needed for chest pain.         Marland Kitchen omeprazole (PRILOSEC) 40 MG capsule   Oral   Take 40 mg by mouth daily.         . Rivaroxaban (XARELTO) 20 MG TABS   Oral   Take 20 mg by mouth daily.         . rosuvastatin (CRESTOR) 5 MG tablet   Oral   Take 5 mg by mouth every other day.          . tetrahydrozoline-zinc (VISINE-AC) 0.05-0.25 % ophthalmic  solution   Both Eyes   Place 2 drops into both eyes 3 (three) times daily as needed (redness relief).           BP 121/78  Pulse 86  Temp(Src) 97.8 F (36.6 C) (Oral)  Resp 18  SpO2 95%  Physical Exam  Nursing note and vitals reviewed. Constitutional: He is oriented to person, place, and time. He appears well-developed.  HENT:  Head: Normocephalic.  Eyes: Conjunctivae and EOM are normal. No scleral icterus.  Neck: Neck supple. No thyromegaly present.  Cardiovascular: Normal rate and regular rhythm.  Exam reveals no gallop and no friction rub.   No murmur heard. Pulmonary/Chest: No stridor. He has no wheezes. He has no rales. He exhibits no tenderness.  Abdominal: He exhibits no distension. There is no tenderness. There is no rebound.  Musculoskeletal: Normal range of motion. He exhibits edema.  2+ pitting edema in bilateral legs distal to knees.   Lymphadenopathy:    He has no cervical adenopathy.  Neurological: He is oriented to person, place, and time. Coordination normal.  Skin: No rash noted. No erythema.  Psychiatric: He has a normal mood and affect. His behavior is normal.    ED Course  Procedures (including critical care time)  DIAGNOSTIC STUDIES: Oxygen Saturation is 95% on room air, adequate by my interpretation.    COORDINATION OF CARE: 11:44 AM Discussed treatment plan with pt at bedside and pt agreed to plan.   Labs Reviewed  CBC WITH DIFFERENTIAL - Abnormal; Notable for the following:    RDW 16.0 (*)    Neutrophils Relative % 31 (*)    Neutro Abs 1.4 (*)    Monocytes Relative 17 (*)    Basophils Relative 3 (*)    All other components within normal limits  COMPREHENSIVE METABOLIC PANEL - Abnormal; Notable for the following:    Glucose, Bld 102 (*)    Albumin 3.3 (*)    AST 117 (*)    ALT 73 (*)    GFR calc non Af Amer 82 (*)    All other components within normal limits  PRO B NATRIURETIC PEPTIDE - Abnormal; Notable for  the following:    Pro B  Natriuretic peptide (BNP) 139.8 (*)    All other components within normal limits  ETHANOL - Abnormal; Notable for the following:    Alcohol, Ethyl (B) 284 (*)    All other components within normal limits  TROPONIN I   Dg Chest Portable 1 View  03/31/2013   *RADIOLOGY REPORT*  Clinical Data: Bilateral leg swelling.  PORTABLE CHEST - 1 VIEW  Comparison: Chest x-ray 08/20/2010.  Findings: The pacer wire / AICD is stable.  The heart is mildly enlarged but unchanged.  The mediastinal and hilar contours are normal.  The lungs are clear.  No pleural effusion. Stable mild pleural thickening bilaterally, likely extrapleural fat.  The bony thorax is intact.  IMPRESSION: Stable mild cardiac enlargement. No acute pulmonary findings.   Original Report Authenticated By: Rudie Meyer, M.D.     No diagnosis found.    MDM        The chart was scribed for me under my direct supervision.  I personally performed the history, physical, and medical decision making and all procedures in the evaluation of this patient.[name], MD 03/31/13 [phone]

## 2013-03-31 NOTE — ED Notes (Signed)
Pt has excessive lower bilateral edema, started two days prior, per MD has increased furosemide from 20mg  daily to 40mg  daily, has hx of CHF, MI, and PE. Pitting edema +3.

## 2013-04-03 ENCOUNTER — Encounter: Payer: Self-pay | Admitting: *Deleted

## 2013-04-09 ENCOUNTER — Encounter: Payer: Self-pay | Admitting: Internal Medicine

## 2013-06-02 ENCOUNTER — Ambulatory Visit (INDEPENDENT_AMBULATORY_CARE_PROVIDER_SITE_OTHER): Payer: Medicare PPO | Admitting: *Deleted

## 2013-06-02 DIAGNOSIS — Z95810 Presence of automatic (implantable) cardiac defibrillator: Secondary | ICD-10-CM

## 2013-06-02 DIAGNOSIS — I472 Ventricular tachycardia: Secondary | ICD-10-CM

## 2013-06-09 LAB — REMOTE ICD DEVICE
BRDY-0002RV: 40 {beats}/min
CHARGE TIME: 11.05 s
DEV-0020ICD: NEGATIVE
TZAT-0004FASTVT: 8
TZAT-0004SLOWVT: 8
TZAT-0005FASTVT: 88 pct
TZAT-0005SLOWVT: 84 pct
TZAT-0005SLOWVT: 91 pct
TZAT-0011SLOWVT: 10 ms
TZAT-0011SLOWVT: 10 ms
TZAT-0012FASTVT: 200 ms
TZAT-0020FASTVT: 1.6 ms
TZON-0011AFLUTTER: 70
TZST-0001FASTVT: 4
TZST-0001SLOWVT: 4
TZST-0001SLOWVT: 6
TZST-0003FASTVT: 35 J
TZST-0003FASTVT: 35 J
TZST-0003FASTVT: 35 J
TZST-0003SLOWVT: 25 J
TZST-0003SLOWVT: 35 J

## 2013-07-02 ENCOUNTER — Encounter: Payer: Self-pay | Admitting: *Deleted

## 2013-07-15 ENCOUNTER — Encounter: Payer: Self-pay | Admitting: Internal Medicine

## 2013-08-08 ENCOUNTER — Encounter: Payer: Self-pay | Admitting: Internal Medicine

## 2013-08-08 ENCOUNTER — Ambulatory Visit (INDEPENDENT_AMBULATORY_CARE_PROVIDER_SITE_OTHER): Payer: Medicare PPO | Admitting: Internal Medicine

## 2013-08-08 VITALS — BP 112/78 | HR 89 | Ht 72.0 in | Wt 283.4 lb

## 2013-08-08 DIAGNOSIS — Z95810 Presence of automatic (implantable) cardiac defibrillator: Secondary | ICD-10-CM

## 2013-08-08 DIAGNOSIS — I2589 Other forms of chronic ischemic heart disease: Secondary | ICD-10-CM

## 2013-08-08 DIAGNOSIS — I5022 Chronic systolic (congestive) heart failure: Secondary | ICD-10-CM

## 2013-08-08 DIAGNOSIS — I472 Ventricular tachycardia: Secondary | ICD-10-CM

## 2013-08-08 DIAGNOSIS — T82198A Other mechanical complication of other cardiac electronic device, initial encounter: Secondary | ICD-10-CM

## 2013-08-08 LAB — ICD DEVICE OBSERVATION
BATTERY VOLTAGE: 2.66 V
BRDY-0002RV: 40 {beats}/min
CHARGE TIME: 11.05 s
RV LEAD IMPEDENCE ICD: 544 Ohm
TZAT-0001SLOWVT: 1
TZAT-0001SLOWVT: 2
TZAT-0004FASTVT: 8
TZAT-0004SLOWVT: 8
TZAT-0004SLOWVT: 8
TZAT-0005FASTVT: 88 pct
TZAT-0005SLOWVT: 84 pct
TZAT-0005SLOWVT: 91 pct
TZAT-0011FASTVT: 10 ms
TZAT-0011SLOWVT: 10 ms
TZAT-0011SLOWVT: 10 ms
TZAT-0012FASTVT: 200 ms
TZAT-0018SLOWVT: NEGATIVE
TZON-0004SLOWVT: 48
TZON-0005SLOWVT: 24
TZST-0001FASTVT: 3
TZST-0001FASTVT: 6
TZST-0001SLOWVT: 3
TZST-0001SLOWVT: 4
TZST-0001SLOWVT: 6
TZST-0003FASTVT: 35 J
TZST-0003FASTVT: 35 J
TZST-0003FASTVT: 35 J
TZST-0003SLOWVT: 25 J
TZST-0003SLOWVT: 35 J
VENTRICULAR PACING ICD: 0 pct
VF: 0

## 2013-08-08 NOTE — Patient Instructions (Addendum)
Remote monitoring is used to monitor your Pacemaker of ICD from home. This monitoring reduces the number of office visits required to check your device to one time per year. It allows [place] to keep an eye on the functioning of your device to ensure it is working properly. You are scheduled for a device check from home on 11/13/2013. You may send your transmission at any time that day. If you have a wireless device, the transmission will be sent automatically. After your physician reviews your transmission, you will receive a postcard with your next transmission date..  Your physician wants you to follow-up in: one year with Dr. Graciela Husbands. You will receive a reminder letter in the mail two months in advance. If you don't receive a letter, please call our office to schedule the follow-up appointment.

## 2013-08-08 NOTE — Assessment & Plan Note (Signed)
No intercurrent Ventricular tachycardia  

## 2013-08-08 NOTE — Assessment & Plan Note (Signed)
Euvolemic. Continue current meds 

## 2013-08-08 NOTE — Assessment & Plan Note (Signed)
Will need lead revision at the time of generator replacement

## 2013-08-08 NOTE — Assessment & Plan Note (Signed)
Stable on current medications 

## 2013-08-08 NOTE — Progress Notes (Signed)
Patient Care Team: Joette Catching, MD as PCP - General (Family Medicine)   HPI  Stephen [name] is a 52 y.o. male  seen in followup for ICD implanted initially for primary prevention in the setting of ischemic heart disease.He has prior stenting of his RCA.He has had appropriate therapy for VT./VF in  July of 08. The cycle length was 200 ms or so .   Denies signif worsening of baseline sob  Has mild edema  Was started on amlodipine a few  months ago.  He has known sleep apnea but has not been using therapy because of mask intolerance. He is currently disabled and working at home   Last measured ejection fraction by echo in 2013 demonstrating an EF of 40-45% %  History of a pulmonary embolism 2012    Past Medical History  Diagnosis Date  . Obesity   . Hypertension   . Hyperlipidemia   . Systolic heart failure   . Coronary artery disease     native vessel  . AICD (automatic cardioverter/defibrillator) present 2006    [employer]tr[employer] Maximo 7232  . Ischemic cardiomyopathy     ischemic  . OSA (obstructive sleep apnea)   . 1610 Lead   . Paroxysmal ventricular tachycardia     Rx via ATP 2008    Past Surgical History  Procedure Laterality Date  . Cardiac defibrillator placement  2006    [employer]tronic Maximo 7232    Current Outpatient Prescriptions  Medication Sig Dispense Refill  . amLODipine (NORVASC) 5 MG tablet Take 5 mg by mouth daily.      . bisoprolol (ZEBETA) 10 MG tablet Take 10 mg by mouth daily.        . cholecalciferol (VITAMIN D) 1000 UNITS tablet Take 1,000 Units by mouth daily.        . diazepam (VALIUM) 5 MG tablet Take 5-10 mg by mouth 2 (two) times daily. One in the morning and two in the evening. Pt may also take 1 extra tablet at bedtime if needed sleep.      . divalproex (DEPAKOTE ER) 500 MG 24 hr tablet Take 500 mg by mouth at bedtime.       . enalapril (VASOTEC) 10 MG tablet Take 10 mg by mouth daily.        . furosemide (LASIX) 20 MG tablet Take 40 mg by mouth  daily.       Marland Kitchen HYDROcodone-acetaminophen (NORCO/VICODIN) 5-325 MG per tablet Take 1 tablet by mouth 2 (two) times daily.      . mometasone (NASONEX) 50 MCG/ACT nasal spray Place 2 sprays into the nose daily.        . nitroGLYCERIN (NITROSTAT) 0.4 MG SL tablet Place 0.4 mg under the tongue every 5 (five) minutes as needed for chest pain.      Marland Kitchen omeprazole (PRILOSEC) 40 MG capsule Take 40 mg by mouth daily.      . Rivaroxaban (XARELTO) 20 MG TABS Take 20 mg by mouth daily.      . rosuvastatin (CRESTOR) 5 MG tablet Take 5 mg by mouth every other day.       . tetrahydrozoline-zinc (VISINE-AC) 0.05-0.25 % ophthalmic solution Place 2 drops into both eyes 3 (three) times daily as needed (redness relief).       No current facility-administered medications for this visit.    No Known Allergies  Review of Systems negative except from HPI and PMH  Physical Exam BP 112/78  Pulse 89  Ht 6' (1.829 m)  Wt 283 lb 6.4 oz (128.549 kg)  BMI 38.43 kg/m2 Well developed and well nourished in no acute distress HENT normal E scleral and icterus clear Neck Supple JVP flat; carotids brisk and full Clear to ausculation Regular rate and rhythm, no murmurs gallops or rub Soft with active bowel sounds No clubbing cyanosis 1+ Edema Alert and oriented, grossly normal motor and sensory function Skin Warm and Dry  Electrocardiogram demonstrates sinus rhythm at 89 Interval 17/11/38 Axis III 1 Prior inferior wall MI   Assessment and  Plan

## 2013-08-08 NOTE — Assessment & Plan Note (Signed)
The patient's device was interrogated.  The information was reviewed. No changes were made in the programming.    

## 2013-08-13 ENCOUNTER — Encounter: Payer: Self-pay | Admitting: Internal Medicine

## 2013-11-12 ENCOUNTER — Encounter: Payer: Self-pay | Admitting: Internal Medicine

## 2013-11-12 ENCOUNTER — Ambulatory Visit (INDEPENDENT_AMBULATORY_CARE_PROVIDER_SITE_OTHER): Payer: Medicare PPO | Admitting: *Deleted

## 2013-11-12 DIAGNOSIS — I4729 Other ventricular tachycardia: Secondary | ICD-10-CM

## 2013-11-12 DIAGNOSIS — I472 Ventricular tachycardia, unspecified: Secondary | ICD-10-CM

## 2013-11-13 LAB — MDC_IDC_ENUM_SESS_TYPE_REMOTE
Battery Voltage: 2.65 V
HighPow Impedance: 46 Ohm
Lead Channel Impedance Value: 448 Ohm
Lead Channel Sensing Intrinsic Amplitude: 11.4 mV
Lead Channel Setting Pacing Amplitude: 2.5 V
Lead Channel Setting Pacing Pulse Width: 0.4 ms
MDC IDC SESS DTM: 20150107222900
MDC IDC SET LEADCHNL RV SENSING SENSITIVITY: 0.3 mV
MDC IDC SET ZONE DETECTION INTERVAL: 240 ms
MDC IDC SET ZONE DETECTION INTERVAL: 270 ms
MDC IDC SET ZONE DETECTION INTERVAL: 300 ms

## 2013-11-19 ENCOUNTER — Encounter: Payer: Self-pay | Admitting: *Deleted

## 2013-12-07 ENCOUNTER — Encounter (HOSPITAL_COMMUNITY): Payer: Self-pay | Admitting: Emergency Medicine

## 2013-12-07 ENCOUNTER — Emergency Department (HOSPITAL_COMMUNITY)
Admission: EM | Admit: 2013-12-07 | Discharge: 2013-12-07 | Disposition: A | Discharge status: Home / Self Care | Payer: Medicare PPO | Attending: Emergency Medicine | Admitting: Emergency Medicine

## 2013-12-07 ENCOUNTER — Emergency Department (HOSPITAL_COMMUNITY): Payer: Medicare PPO

## 2013-12-07 DIAGNOSIS — Y929 Unspecified place or not applicable: Secondary | ICD-10-CM | POA: Insufficient documentation

## 2013-12-07 DIAGNOSIS — S8000XA Contusion of unspecified knee, initial encounter: Secondary | ICD-10-CM | POA: Insufficient documentation

## 2013-12-07 DIAGNOSIS — X58XXXA Exposure to other specified factors, initial encounter: Secondary | ICD-10-CM | POA: Insufficient documentation

## 2013-12-07 DIAGNOSIS — IMO0002 Reserved for concepts with insufficient information to code with codable children: Secondary | ICD-10-CM | POA: Insufficient documentation

## 2013-12-07 DIAGNOSIS — Y939 Activity, unspecified: Secondary | ICD-10-CM | POA: Insufficient documentation

## 2013-12-07 DIAGNOSIS — I2510 Atherosclerotic heart disease of native coronary artery without angina pectoris: Secondary | ICD-10-CM | POA: Insufficient documentation

## 2013-12-07 DIAGNOSIS — Z7901 Long term (current) use of anticoagulants: Secondary | ICD-10-CM | POA: Insufficient documentation

## 2013-12-07 DIAGNOSIS — S7010XA Contusion of unspecified thigh, initial encounter: Secondary | ICD-10-CM | POA: Insufficient documentation

## 2013-12-07 DIAGNOSIS — Z9889 Other specified postprocedural states: Secondary | ICD-10-CM | POA: Insufficient documentation

## 2013-12-07 DIAGNOSIS — Z95810 Presence of automatic (implantable) cardiac defibrillator: Secondary | ICD-10-CM | POA: Insufficient documentation

## 2013-12-07 DIAGNOSIS — I10 Essential (primary) hypertension: Secondary | ICD-10-CM | POA: Insufficient documentation

## 2013-12-07 DIAGNOSIS — I472 Ventricular tachycardia, unspecified: Secondary | ICD-10-CM | POA: Insufficient documentation

## 2013-12-07 DIAGNOSIS — S8990XA Unspecified injury of unspecified lower leg, initial encounter: Secondary | ICD-10-CM

## 2013-12-07 DIAGNOSIS — I4729 Other ventricular tachycardia: Secondary | ICD-10-CM | POA: Insufficient documentation

## 2013-12-07 DIAGNOSIS — Z86711 Personal history of pulmonary embolism: Secondary | ICD-10-CM | POA: Insufficient documentation

## 2013-12-07 DIAGNOSIS — Z79899 Other long term (current) drug therapy: Secondary | ICD-10-CM | POA: Insufficient documentation

## 2013-12-07 DIAGNOSIS — F17200 Nicotine dependence, unspecified, uncomplicated: Secondary | ICD-10-CM | POA: Insufficient documentation

## 2013-12-07 DIAGNOSIS — E669 Obesity, unspecified: Secondary | ICD-10-CM | POA: Insufficient documentation

## 2013-12-07 DIAGNOSIS — E785 Hyperlipidemia, unspecified: Secondary | ICD-10-CM | POA: Insufficient documentation

## 2013-12-07 MED ORDER — HYDROCODONE-ACETAMINOPHEN 5-325 MG PO TABS
ORAL_TABLET | ORAL | Status: DC
Start: 2013-12-07 — End: 2013-12-10

## 2013-12-07 MED ORDER — HYDROCODONE-ACETAMINOPHEN 5-325 MG PO TABS
1.0000 | ORAL_TABLET | Freq: Once | ORAL | Status: AC
  Administered 2013-12-07: 1 via ORAL
  Filled 2013-12-07: qty 1

## 2013-12-07 NOTE — ED Provider Notes (Signed)
CSN: [id number]     Arrival date & time 12/07/13  1732 History   This chart was scribed for Janice Norrie, MD by Maree Erie, ED Scribe. The patient was seen in room APA17/APA17. Patient's care was started at 7:29 PM.     Chief Complaint  Patient presents with  . Leg Swelling    The history is provided by the patient. No language interpreter was used.    HPI Comments: Stephen [name] is a 53 y.o. male who presents to the Emergency Department complaining of constant left knee pain that began two days ago. He describes the pain as throbbing and aching. He states he has associated swelling to the area. He states that he noticed bruising as well to the area upon waking this morning. He can ambulate on the leg with worsened pain but denies it buckles or gives out. He denies any acute injury or trauma that he knows of. He has had a prior similar episode of pain in the knee due to a complete disruption of the ACL. He had a cadaver graft done about 15 years ago to repair the disrupted ACL.  He is currently on Xarelto. He denies taking aspirin on a regular basis. He denies fever or chills. Standing on the leg makes his pain worse, nothing makes it better.    His PCP is Dr. Edrick Oh. Cardiologist is Dr. Virl Axe, [place] He does not have an orthopedist currently.   Past Medical History  Diagnosis Date  . Obesity   . Hypertension   . Hyperlipidemia   . Systolic heart failure   . Coronary artery disease     native vessel  . AICD (automatic cardioverter/defibrillator) present 2006    [place] 7232  . Ischemic cardiomyopathy     ischemic  . OSA (obstructive sleep apnea)   . [place] Paroxysmal ventricular tachycardia     Rx via ATP 2008  . Pulmonary embolism    Past Surgical History  Procedure Laterality Date  . Cardiac defibrillator placement  2006    Medtronic Maximo 7232  . Knee surgery      left    No family history on file. History  Substance Use Topics  . Smoking  status: Current Every Day Smoker -- 1.00 packs/day    Types: Cigarettes  . Smokeless tobacco: Never Used  . Alcohol Use: Yes     Comment: socially  lives at home Lives with spouse Smokes 1/2 ppd  Review of Systems  Constitutional: Negative for fever.  Cardiovascular: Positive for leg swelling.  Musculoskeletal:       Positive for knee pain (left).  All other systems reviewed and are negative.    Allergies  Ativan and Haldol  Home Medications   Current Outpatient Rx  Name  Route  Sig  Dispense  Refill  . amLODipine (NORVASC) 5 MG tablet   Oral   Take 5 mg by mouth daily.         . bisoprolol (ZEBETA) 5 MG tablet   Oral   Take 5 mg by mouth 2 (two) times daily.         . cholecalciferol (VITAMIN D) 1000 UNITS tablet   Oral   Take 1,000 Units by mouth daily.           . diazepam (VALIUM) 5 MG tablet   Oral   Take 5-10 mg by mouth 2 (two) times daily. One in the morning and two in the evening.  Pt may also take 1 extra tablet at bedtime if needed sleep.         . divalproex (DEPAKOTE ER) 500 MG 24 hr tablet   Oral   Take 500 mg by mouth at bedtime.          . enalapril (VASOTEC) 10 MG tablet   Oral   Take 10 mg by mouth daily.           . folic acid (FOLVITE) 1 MG tablet   Oral   Take 1 mg by mouth daily.         . furosemide (LASIX) 40 MG tablet   Oral   Take 40 mg by mouth daily.         Marland Kitchen HYDROcodone-acetaminophen (NORCO/VICODIN) 5-325 MG per tablet   Oral   Take 1 tablet by mouth 2 (two) times daily.         Marland Kitchen KLOR-CON M20 20 MEQ tablet   Oral   Take 20 mEq by mouth daily.         . mometasone (NASONEX) 50 MCG/ACT nasal spray   Nasal   Place 2 sprays into the nose daily.           Marland Kitchen omeprazole (PRILOSEC) 40 MG capsule   Oral   Take 40 mg by mouth daily.         . Rivaroxaban (XARELTO) 20 MG TABS   Oral   Take 20 mg by mouth daily.         . rosuvastatin (CRESTOR) 5 MG tablet   Oral   Take 5 mg by mouth every other  day.          . tetrahydrozoline-zinc (VISINE-AC) 0.05-0.25 % ophthalmic solution   Both Eyes   Place 2 drops into both eyes 3 (three) times daily as needed (redness relief).         . nitroGLYCERIN (NITROSTAT) 0.4 MG SL tablet   Sublingual   Place 0.4 mg under the tongue every 5 (five) minutes as needed for chest pain.          Triage Vitals: BP 125/86  Pulse 88  Temp(Src) 98.3 F (36.8 C) (Oral)  Resp 20  Ht 6' (1.829 m)  Wt 260 lb (117.935 kg)  BMI 35.25 kg/m2  SpO2 96%  Vital signs normal    Physical Exam  Nursing note and vitals reviewed. Constitutional: He is oriented to person, place, and time. He appears well-developed and well-nourished.  Non-toxic appearance. He does not appear ill. No distress.  HENT:  Head: Normocephalic and atraumatic.  Right Ear: External ear normal.  Left Ear: External ear normal.  Nose: Nose normal. No mucosal edema or rhinorrhea.  Mouth/Throat: Oropharynx is clear and moist and mucous membranes are normal. No dental abscesses or uvula swelling.  Eyes: Conjunctivae and EOM are normal. Pupils are equal, round, and reactive to light.  Neck: Normal range of motion and full passive range of motion without pain. Neck supple.  Cardiovascular: Normal rate, regular rhythm and normal heart sounds.  Exam reveals no gallop and no friction rub.   No murmur heard. Pulmonary/Chest: Effort normal and breath sounds normal. No respiratory distress. He has no wheezes. He has no rhonchi. He has no rales. He exhibits no tenderness and no crepitus.  Abdominal: Soft. Normal appearance and bowel sounds are normal. He exhibits no distension. There is no tenderness. There is no rebound and no guarding.  Musculoskeletal: Normal range of motion. He exhibits edema and  tenderness.  Small effusion of left knee. Tender in the pre patellar bursa. When patella is palpated it is tender and there is a step off laterally of the patella of uncertain significance.  Bruising  of medial distal thigh on the left, distal to the knee. Worse medially.   Neurological: He is alert and oriented to person, place, and time. He has normal strength. No cranial nerve deficit.  Skin: Skin is warm, dry and intact. No rash noted. No erythema. No pallor.  nicotene stains on his right thumb, index and middle fingers from the DIP distally.  Psychiatric: He has a normal mood and affect. His speech is normal and behavior is normal. His mood appears not anxious.           ED Course  Procedures (including critical care time) Medications  HYDROcodone-acetaminophen (NORCO/VICODIN) 5-325 MG per tablet 1 tablet (1 tablet Oral Given 12/07/13 2002)     Medications  HYDROcodone-acetaminophen (NORCO/VICODIN) 5-325 MG per tablet 1 tablet (1 tablet Oral Given 12/07/13 2002)    DIAGNOSTIC STUDIES: Oxygen Saturation is 96% on room air, adequate by my interpretation.    COORDINATION OF CARE: 7:37 PM -Will order left knee x-ray. Patient verbalizes understanding and agrees with treatment plan. Pt told the nurse he has had pain in his left lateral chest and requested an xray.   9:24 PM -Discussed and explained radiology results to patient. Recommend follow up with with Orthopedics. Will discharge with patient in knee immobilizer and crutches. Patient verbalizes understanding and agrees with treatment plan.   Pt has bruising c/w his taking xarelto. He does not describe an injury,but does have a small joint effusion on exam and on xray with swelling of his prepatellar bursa also on exam and on xray. Will need ortho referral.   Dg Ribs Unilateral W/chest Left  12/07/2013   CLINICAL DATA:  Cough and shortness of breath.  EXAM: LEFT RIBS AND CHEST - 3+ VIEW  COMPARISON:  Single view of the chest 03/31/2013.  FINDINGS: AICD is in place, unchanged with the tip projecting over the apex of the right ventricle. There is cardiomegaly without edema. Lungs are clear. No pneumothorax. No fracture is  identified.  IMPRESSION: No acute finding.   Electronically Signed   By: Inge Rise M.D.   On: 12/07/2013 20:53   Dg Knee Complete 4 Views Left  12/07/2013   CLINICAL DATA:  Knee pain and swelling. Bruising. Symptoms for 2 days.  EXAM: LEFT KNEE - COMPLETE 4+ VIEW  COMPARISON:  None.  FINDINGS: No fracture or dislocation is identified. There is soft tissue swelling anterior to the patella. There is some joint space narrowing in the medial compartment with small osteophytes. Small joint effusion is noted.  IMPRESSION: Soft tissue swelling anterior to the patella is compatible with bursitis, possibly hemorrhagic.  No acute bony abnormality is identified.  Small joint effusion.   Electronically Signed   By: Inge Rise M.D.   On: 12/07/2013 20:52    EKG Interpretation   None       MDM   1. Knee injury     Discharge Medication List as of 12/07/2013  9:26 PM    START taking these medications   Details  !! HYDROcodone-acetaminophen (NORCO/VICODIN) 5-325 MG per tablet 1 every 6 hrs prn pain, Print     !! - Potential duplicate medications found. Please discuss with provider.      Plan discharge   Rolland Porter, MD, FACEP   I personally performed  the services described in this documentation, which was scribed in my presence. The recorded information has been reviewed and considered.  Rolland Porter, MD, FACEP    Janice Norrie, MD 12/08/13 [phone]

## 2013-12-07 NOTE — ED Notes (Signed)
Pt states he has had left knee pain on Friday, today started brusing, pt also has bruise to right wrist. Wife at the bedside.

## 2013-12-07 NOTE — ED Notes (Signed)
Wife states she ace wrapped left leg yesterday, no tight, no bruising noted, when unwrapped today the bruising was there.

## 2013-12-07 NOTE — ED Notes (Signed)
Patient states that he is having pain on left side in rib cage area when coughing. States that if he press on it, it relieves some of the pressure. States that he has a history of cardiac issues.

## 2013-12-07 NOTE — Discharge Instructions (Signed)
Elevate your leg. Use ice packs to get the swelling down. Wear the knee immobilizer to stabilize your knee until you can see the orthopedist. Call the office tomorrow to get an appointment.

## 2013-12-07 NOTE — ED Notes (Signed)
Left leg swelling x 2 days with pain.

## 2013-12-10 ENCOUNTER — Encounter: Payer: Self-pay | Admitting: Orthopedic Surgery

## 2013-12-10 ENCOUNTER — Ambulatory Visit (INDEPENDENT_AMBULATORY_CARE_PROVIDER_SITE_OTHER): Payer: Medicare PPO | Admitting: Orthopedic Surgery

## 2013-12-10 VITALS — BP 127/85 | Ht 72.0 in | Wt 288.0 lb

## 2013-12-10 DIAGNOSIS — M2500 Hemarthrosis, unspecified joint: Secondary | ICD-10-CM | POA: Insufficient documentation

## 2013-12-10 DIAGNOSIS — M25562 Pain in left knee: Secondary | ICD-10-CM

## 2013-12-10 DIAGNOSIS — M25569 Pain in unspecified knee: Secondary | ICD-10-CM

## 2013-12-10 MED ORDER — HYDROCODONE-ACETAMINOPHEN 7.5-325 MG PO TABS: 1.0000 | ORAL_TABLET | Freq: Four times a day (QID) | ORAL | Status: DC | PRN

## 2013-12-10 MED ORDER — RIVAROXABAN 15 MG PO TABS: 15.0000 mg | ORAL_TABLET | Freq: Every day | ORAL | Status: DC

## 2013-12-10 NOTE — Patient Instructions (Signed)
Take xarelto 15 mg one daily Take Hydrocodone 7.5/325 mg

## 2013-12-10 NOTE — Progress Notes (Signed)
Patient ID: Stephen [name], male   DOB: [birth year], 53 y.o.   MRN: [id number]  Chief Complaint  Patient presents with  . Knee Pain    Left knee pain, no injury    HISTORY: 53 year old male who says he has spontaneous onset of pain swelling and ecchymosis in his left leg and knee area which started on 12/05/2013. He was evaluated in the emergency room and placed in a long-leg splint and advised to use support for weightbearing. He presents with sharp throbbing 10 out of 10 constant pain associated with some bruising and swelling in the left leg and knee. He is on xarelto for pulmonary embolism, change from Coumadin due to inability to control INR. I spoke with his primary care physician who says he drinks a lot and may have injured himself and doesn't remember. He gave history of no trauma.  Review of systems completed he complains of fever chills chest pain cough easy bruising and seasonal allergies  He has a history of heart disease he had left knee surgery is a family history of heart disease arthritis cancer diabetes  Social history married he reports very amounts of alcohol use and smoking 10 cigarettes per day is currently retired.  BP 127/85  Ht 6' (1.829 m)  Wt 288 lb (130.636 kg)  BMI 39.05 kg/m2 He has some mild to moderate amount of obesity he is otherwise well-developed and nourished grooming and hygiene are normal he has no gross deformities he is oriented to person place and time he seems a little bit off his mood is pleasant his affect is flat  He is ambulating with a brace.  Gross findings on left upper and right upper extremity revealed no malalignment contracture subluxation atrophy tremor or skin abnormalities. Perfusion of the limbs are normal with no lymphadenopathy and normal sensation and no pathologic reflexes coordination remains normal  As far as the left lower extremity goes he is a large amount of ecchymosis medially and anteriorly including joint effusion  decreased range of motion but stable knee muscle tone is normal scans intact ecchymosis noted strong pulse warm foot no distal swelling calf nontender lymph nodes negative sensation normal pathologic reflexes negative coordination and balance adequate  Right lower extremity normal  X-rays negative except for fusion  Medical decision making  New problem hemarthrosis with left knee pain my interpretation of the x-rays that it is normal except for fusion. I spoke with primary care doctor who advised we could reduce his xarelto 15mg  daily  I will see him back again in about 2 weeks if necessary we will aspirate his knee but it doesn't look that bad right now.  Encounter Diagnoses  Name Primary?  . Left knee pain Yes  . Hemarthrosis

## 2013-12-25 ENCOUNTER — Ambulatory Visit: Payer: Medicare PPO | Admitting: Orthopedic Surgery

## 2014-01-13 ENCOUNTER — Ambulatory Visit (INDEPENDENT_AMBULATORY_CARE_PROVIDER_SITE_OTHER): Payer: Medicare PPO | Admitting: Orthopedic Surgery

## 2014-01-13 ENCOUNTER — Encounter: Payer: Self-pay | Admitting: Orthopedic Surgery

## 2014-01-13 VITALS — BP 148/94 | Ht 72.0 in | Wt 288.0 lb

## 2014-01-13 DIAGNOSIS — M25569 Pain in unspecified knee: Secondary | ICD-10-CM

## 2014-01-13 DIAGNOSIS — M2500 Hemarthrosis, unspecified joint: Secondary | ICD-10-CM

## 2014-01-13 DIAGNOSIS — M25562 Pain in left knee: Secondary | ICD-10-CM

## 2014-01-13 MED ORDER — HYDROCODONE-ACETAMINOPHEN 7.5-325 MG PO TABS: 1.0000 | ORAL_TABLET | Freq: Four times a day (QID) | ORAL | Status: DC | PRN

## 2014-01-13 NOTE — Patient Instructions (Signed)
Resume normal dose of xarelto

## 2014-01-13 NOTE — Progress Notes (Signed)
Patient ID: Stephen [name], male   DOB: 01-23-1961, 53 y.o.   MRN: [id number]  Chief Complaint  Patient presents with  . Follow-up    2 week recheck left knee hemarthrosis    BP 148/94  Ht 6' (1.829 m)  Wt 288 lb (130.636 kg)  BMI 39.05 kg/m2  Encounter Diagnoses  Name Primary?  . Left knee pain   . Hemarthrosis Yes   Followup visit status post hematoma spontaneous hemarthrosis left knee treated with decrease in amount of his relative. Patient's knee feels much better he would like another pain medication refill. He is on Norco 7.5  System review history shoulder pain osteoarthritis left knee chronic problems previously evaluated by Dr. Percell Miller  Knee looks good the swelling is down the discoloration of the skin has improved he has approximately 125 of knee flexion with full extension, ligaments are stable motor exam is normal neurovascular exam is intact he is walking without support  BP 148/94  Ht 6' (1.829 m)  Wt 288 lb (130.636 kg)  BMI 39.05 kg/m2  Spontaneous hemarthrosis resolved  Return as needed  Meds ordered this encounter  Medications  . HYDROcodone-acetaminophen (NORCO) 7.5-325 MG per tablet    Sig: Take 1 tablet by mouth every 6 (six) hours as needed for moderate pain.    Dispense:  56 tablet    Refill:  0

## 2014-02-13 ENCOUNTER — Ambulatory Visit (INDEPENDENT_AMBULATORY_CARE_PROVIDER_SITE_OTHER): Payer: Medicare PPO | Admitting: *Deleted

## 2014-02-13 DIAGNOSIS — Z95810 Presence of automatic (implantable) cardiac defibrillator: Secondary | ICD-10-CM

## 2014-02-13 DIAGNOSIS — I2589 Other forms of chronic ischemic heart disease: Secondary | ICD-10-CM

## 2014-02-18 LAB — MDC_IDC_ENUM_SESS_TYPE_REMOTE
HIGH POWER IMPEDANCE MEASURED VALUE: 46 Ohm
Lead Channel Impedance Value: 472 Ohm
Lead Channel Setting Pacing Pulse Width: 0.4 ms
Lead Channel Setting Sensing Sensitivity: 0.3 mV
MDC IDC MSMT BATTERY VOLTAGE: 2.64 V
MDC IDC MSMT LEADCHNL RV SENSING INTR AMPL: 12.9 mV
MDC IDC SESS DTM: 20150410201300
MDC IDC SET LEADCHNL RV PACING AMPLITUDE: 2.5 V
MDC IDC SET ZONE DETECTION INTERVAL: 240 ms
MDC IDC SET ZONE DETECTION INTERVAL: 270 ms
MDC IDC STAT BRADY RV PERCENT PACED: 0 %
Zone Setting Detection Interval: 300 ms

## 2014-03-05 ENCOUNTER — Encounter: Payer: Self-pay | Admitting: *Deleted

## 2014-03-16 ENCOUNTER — Ambulatory Visit (INDEPENDENT_AMBULATORY_CARE_PROVIDER_SITE_OTHER): Payer: Self-pay | Admitting: *Deleted

## 2014-03-16 ENCOUNTER — Telehealth: Payer: Self-pay | Admitting: Cardiology

## 2014-03-16 DIAGNOSIS — I472 Ventricular tachycardia, unspecified: Secondary | ICD-10-CM

## 2014-03-16 DIAGNOSIS — I2589 Other forms of chronic ischemic heart disease: Secondary | ICD-10-CM

## 2014-03-16 NOTE — Telephone Encounter (Signed)
Spoke with pt and reminded pt of remote transmission. Pt verbalized understanding.

## 2014-03-16 NOTE — Progress Notes (Signed)
Remote ICD transmission.   

## 2014-03-17 LAB — MDC_IDC_ENUM_SESS_TYPE_REMOTE
Date Time Interrogation Session: 20150511200700
HIGH POWER IMPEDANCE MEASURED VALUE: 46 Ohm
Lead Channel Setting Sensing Sensitivity: 0.3 mV
MDC IDC MSMT BATTERY VOLTAGE: 2.64 V
MDC IDC MSMT LEADCHNL RV IMPEDANCE VALUE: 472 Ohm
MDC IDC MSMT LEADCHNL RV SENSING INTR AMPL: 11.8 mV
MDC IDC SET LEADCHNL RV PACING AMPLITUDE: 2.5 V
MDC IDC SET LEADCHNL RV PACING PULSEWIDTH: 0.4 ms
MDC IDC SET ZONE DETECTION INTERVAL: 240 ms
MDC IDC STAT BRADY RV PERCENT PACED: 0 %
Zone Setting Detection Interval: 270 ms
Zone Setting Detection Interval: 300 ms

## 2014-03-20 ENCOUNTER — Encounter: Payer: Self-pay | Admitting: Internal Medicine

## 2014-04-03 ENCOUNTER — Encounter: Payer: Self-pay | Admitting: Cardiology

## 2014-04-14 ENCOUNTER — Encounter: Payer: Self-pay | Admitting: Internal Medicine

## 2014-04-16 ENCOUNTER — Telehealth: Payer: Self-pay | Admitting: Cardiology

## 2014-04-16 ENCOUNTER — Ambulatory Visit (INDEPENDENT_AMBULATORY_CARE_PROVIDER_SITE_OTHER): Payer: Medicare PPO | Admitting: *Deleted

## 2014-04-16 DIAGNOSIS — Z95810 Presence of automatic (implantable) cardiac defibrillator: Secondary | ICD-10-CM

## 2014-04-16 LAB — MDC_IDC_ENUM_SESS_TYPE_REMOTE
Brady Statistic RV Percent Paced: 0 %
Date Time Interrogation Session: 20150611171200
HighPow Impedance: 46 Ohm
Lead Channel Sensing Intrinsic Amplitude: 10.5 mV
Lead Channel Setting Pacing Amplitude: 2.5 V
MDC IDC MSMT BATTERY VOLTAGE: 2.64 V
MDC IDC MSMT LEADCHNL RV IMPEDANCE VALUE: 408 Ohm
MDC IDC SET LEADCHNL RV PACING PULSEWIDTH: 0.4 ms
MDC IDC SET LEADCHNL RV SENSING SENSITIVITY: 0.3 mV
MDC IDC SET ZONE DETECTION INTERVAL: 300 ms
Zone Setting Detection Interval: 240 ms
Zone Setting Detection Interval: 270 ms

## 2014-04-16 NOTE — Progress Notes (Signed)
Remote ICD transmission.   

## 2014-04-16 NOTE — Telephone Encounter (Signed)
Spoke with pt and reminded pt of remote transmission that is due today. Pt verbalized understanding.   

## 2014-05-12 ENCOUNTER — Encounter: Payer: Self-pay | Admitting: Cardiology

## 2014-05-19 ENCOUNTER — Encounter: Payer: Self-pay | Admitting: Internal Medicine

## 2014-05-21 ENCOUNTER — Ambulatory Visit (INDEPENDENT_AMBULATORY_CARE_PROVIDER_SITE_OTHER): Payer: Medicare PPO | Admitting: *Deleted

## 2014-05-21 ENCOUNTER — Telehealth: Payer: Self-pay | Admitting: Cardiology

## 2014-05-21 DIAGNOSIS — I2589 Other forms of chronic ischemic heart disease: Secondary | ICD-10-CM

## 2014-05-21 DIAGNOSIS — I472 Ventricular tachycardia, unspecified: Secondary | ICD-10-CM

## 2014-05-21 DIAGNOSIS — I4729 Other ventricular tachycardia: Secondary | ICD-10-CM

## 2014-05-21 NOTE — Telephone Encounter (Signed)
Spoke with pt and reminded pt of remote transmission that is due today. Pt verbalized understanding.   

## 2014-05-21 NOTE — Progress Notes (Signed)
Remote ICD transmission.   

## 2014-05-25 LAB — MDC_IDC_ENUM_SESS_TYPE_REMOTE
Battery Voltage: 2.64 V
Brady Statistic RV Percent Paced: 0 %
Date Time Interrogation Session: 20150716181800
HighPow Impedance: 46 Ohm
Lead Channel Impedance Value: 536 Ohm
Lead Channel Sensing Intrinsic Amplitude: 14.8 mV
Lead Channel Setting Pacing Pulse Width: 0.4 ms
Lead Channel Setting Sensing Sensitivity: 0.3 mV
MDC IDC SET LEADCHNL RV PACING AMPLITUDE: 2.5 V
Zone Setting Detection Interval: 240 ms
Zone Setting Detection Interval: 270 ms
Zone Setting Detection Interval: 300 ms

## 2014-06-30 ENCOUNTER — Encounter: Payer: Self-pay | Admitting: Cardiology

## 2014-07-06 ENCOUNTER — Encounter: Payer: Self-pay | Admitting: Internal Medicine

## 2014-07-13 ENCOUNTER — Inpatient Hospital Stay (HOSPITAL_COMMUNITY)
Admission: EM | Admit: 2014-07-13 | Discharge: 2014-07-15 | DRG: 311 | Disposition: A | Discharge status: Home / Self Care | Payer: Medicare PPO | Attending: Internal Medicine | Admitting: Internal Medicine

## 2014-07-13 ENCOUNTER — Encounter (HOSPITAL_COMMUNITY): Payer: Self-pay | Admitting: Emergency Medicine

## 2014-07-13 ENCOUNTER — Emergency Department (HOSPITAL_COMMUNITY): Payer: Medicare PPO

## 2014-07-13 DIAGNOSIS — I252 Old myocardial infarction: Secondary | ICD-10-CM

## 2014-07-13 DIAGNOSIS — G4733 Obstructive sleep apnea (adult) (pediatric): Secondary | ICD-10-CM | POA: Diagnosis present

## 2014-07-13 DIAGNOSIS — I472 Ventricular tachycardia, unspecified: Secondary | ICD-10-CM | POA: Diagnosis present

## 2014-07-13 DIAGNOSIS — Z86711 Personal history of pulmonary embolism: Secondary | ICD-10-CM | POA: Diagnosis not present

## 2014-07-13 DIAGNOSIS — E669 Obesity, unspecified: Secondary | ICD-10-CM | POA: Diagnosis present

## 2014-07-13 DIAGNOSIS — Z79899 Other long term (current) drug therapy: Secondary | ICD-10-CM

## 2014-07-13 DIAGNOSIS — E785 Hyperlipidemia, unspecified: Secondary | ICD-10-CM | POA: Diagnosis present

## 2014-07-13 DIAGNOSIS — I2510 Atherosclerotic heart disease of native coronary artery without angina pectoris: Secondary | ICD-10-CM | POA: Diagnosis present

## 2014-07-13 DIAGNOSIS — R112 Nausea with vomiting, unspecified: Secondary | ICD-10-CM | POA: Diagnosis not present

## 2014-07-13 DIAGNOSIS — T82198A Other mechanical complication of other cardiac electronic device, initial encounter: Secondary | ICD-10-CM | POA: Diagnosis present

## 2014-07-13 DIAGNOSIS — R0789 Other chest pain: Secondary | ICD-10-CM

## 2014-07-13 DIAGNOSIS — I4729 Other ventricular tachycardia: Secondary | ICD-10-CM | POA: Diagnosis present

## 2014-07-13 DIAGNOSIS — Z95810 Presence of automatic (implantable) cardiac defibrillator: Secondary | ICD-10-CM

## 2014-07-13 DIAGNOSIS — I5022 Chronic systolic (congestive) heart failure: Secondary | ICD-10-CM | POA: Diagnosis present

## 2014-07-13 DIAGNOSIS — Z6836 Body mass index (BMI) 36.0-36.9, adult: Secondary | ICD-10-CM | POA: Diagnosis not present

## 2014-07-13 DIAGNOSIS — F17200 Nicotine dependence, unspecified, uncomplicated: Secondary | ICD-10-CM | POA: Diagnosis present

## 2014-07-13 DIAGNOSIS — I25110 Atherosclerotic heart disease of native coronary artery with unstable angina pectoris: Secondary | ICD-10-CM

## 2014-07-13 DIAGNOSIS — I255 Ischemic cardiomyopathy: Secondary | ICD-10-CM | POA: Diagnosis present

## 2014-07-13 DIAGNOSIS — I10 Essential (primary) hypertension: Secondary | ICD-10-CM | POA: Diagnosis present

## 2014-07-13 DIAGNOSIS — I2589 Other forms of chronic ischemic heart disease: Secondary | ICD-10-CM

## 2014-07-13 DIAGNOSIS — Z9861 Coronary angioplasty status: Secondary | ICD-10-CM

## 2014-07-13 DIAGNOSIS — I200 Unstable angina: Principal | ICD-10-CM | POA: Diagnosis present

## 2014-07-13 DIAGNOSIS — E782 Mixed hyperlipidemia: Secondary | ICD-10-CM | POA: Diagnosis present

## 2014-07-13 DIAGNOSIS — Z7901 Long term (current) use of anticoagulants: Secondary | ICD-10-CM | POA: Diagnosis not present

## 2014-07-13 DIAGNOSIS — E663 Overweight: Secondary | ICD-10-CM | POA: Diagnosis present

## 2014-07-13 DIAGNOSIS — R079 Chest pain, unspecified: Secondary | ICD-10-CM

## 2014-07-13 HISTORY — DX: Old myocardial infarction: I25.2

## 2014-07-13 LAB — BASIC METABOLIC PANEL
ANION GAP: 15 (ref 5–15)
BUN: 10 mg/dL (ref 6–23)
CALCIUM: 8.9 mg/dL (ref 8.4–10.5)
CO2: 22 mEq/L (ref 19–32)
Chloride: 103 mEq/L (ref 96–112)
Creatinine, Ser: 0.89 mg/dL (ref 0.50–1.35)
GFR calc Af Amer: >90 mL/min (ref >90)
Glucose, Bld: 106 mg/dL — ABNORMAL HIGH (ref 70–99)
Potassium: 4.3 mEq/L (ref 3.7–5.3)
Sodium: 140 mEq/L (ref 137–147)

## 2014-07-13 LAB — CBC
HCT: 41.6 % (ref 39.0–52.0)
Hemoglobin: 14 g/dL (ref 13.0–17.0)
MCH: 32.6 pg (ref 26.0–34.0)
MCHC: 33.7 g/dL (ref 30.0–36.0)
MCV: 96.7 fL (ref 78.0–100.0)
PLATELETS: 224 10*3/uL (ref 150–400)
RBC: 4.3 MIL/uL (ref 4.22–5.81)
RDW: 13.7 % (ref 11.5–15.5)
WBC: 10.4 10*3/uL (ref 4.0–10.5)

## 2014-07-13 LAB — I-STAT TROPONIN, ED: Troponin i, poc: 0 ng/mL (ref 0.00–0.08)

## 2014-07-13 LAB — TROPONIN I: Troponin I: <0.3 ng/mL (ref <0.30)

## 2014-07-13 MED ORDER — DIVALPROEX SODIUM ER 500 MG PO TB24
500.0000 mg | ORAL_TABLET | Freq: Every day | ORAL | Status: DC
  Administered 2014-07-14: 500 mg via ORAL
  Filled 2014-07-13 (×2): qty 1

## 2014-07-13 MED ORDER — ATORVASTATIN CALCIUM 10 MG PO TABS
10.0000 mg | ORAL_TABLET | Freq: Every day | ORAL | Status: DC
  Administered 2014-07-14: 10 mg via ORAL
  Filled 2014-07-13 (×2): qty 1

## 2014-07-13 MED ORDER — BISOPROLOL FUMARATE 5 MG PO TABS
5.0000 mg | ORAL_TABLET | Freq: Two times a day (BID) | ORAL | Status: DC
  Administered 2014-07-14 – 2014-07-15 (×3): 5 mg via ORAL
  Filled 2014-07-13 (×4): qty 1

## 2014-07-13 MED ORDER — ASPIRIN 81 MG PO CHEW
324.0000 mg | CHEWABLE_TABLET | Freq: Once | ORAL | Status: AC
  Administered 2014-07-13: 324 mg via ORAL
  Filled 2014-07-13: qty 4

## 2014-07-13 MED ORDER — POTASSIUM CHLORIDE CRYS ER 20 MEQ PO TBCR
20.0000 meq | EXTENDED_RELEASE_TABLET | Freq: Every day | ORAL | Status: DC
  Administered 2014-07-14 – 2014-07-15 (×2): 20 meq via ORAL
  Filled 2014-07-13 (×2): qty 1

## 2014-07-13 MED ORDER — FLUTICASONE PROPIONATE 50 MCG/ACT NA SUSP
2.0000 | Freq: Every day | NASAL | Status: DC
  Filled 2014-07-13: qty 16

## 2014-07-13 MED ORDER — PANTOPRAZOLE SODIUM 40 MG PO TBEC
40.0000 mg | DELAYED_RELEASE_TABLET | Freq: Every day | ORAL | Status: DC
  Administered 2014-07-14 – 2014-07-15 (×2): 40 mg via ORAL
  Filled 2014-07-13 (×2): qty 1

## 2014-07-13 MED ORDER — DIVALPROEX SODIUM ER 500 MG PO TB24
500.0000 mg | ORAL_TABLET | ORAL | Status: AC
  Administered 2014-07-14: 500 mg via ORAL
  Filled 2014-07-13: qty 1

## 2014-07-13 MED ORDER — FUROSEMIDE 40 MG PO TABS
40.0000 mg | ORAL_TABLET | Freq: Every day | ORAL | Status: DC
  Administered 2014-07-14 – 2014-07-15 (×2): 40 mg via ORAL
  Filled 2014-07-13 (×2): qty 1

## 2014-07-13 MED ORDER — AMLODIPINE BESYLATE 5 MG PO TABS
5.0000 mg | ORAL_TABLET | Freq: Every day | ORAL | Status: DC
  Administered 2014-07-14 – 2014-07-15 (×2): 5 mg via ORAL
  Filled 2014-07-13 (×2): qty 1

## 2014-07-13 MED ORDER — BISOPROLOL FUMARATE 5 MG PO TABS
5.0000 mg | ORAL_TABLET | ORAL | Status: AC
  Administered 2014-07-14: 5 mg via ORAL
  Filled 2014-07-13: qty 1

## 2014-07-13 MED ORDER — ENOXAPARIN SODIUM 40 MG/0.4ML ~~LOC~~ SOLN
40.0000 mg | SUBCUTANEOUS | Status: DC
  Administered 2014-07-14: 40 mg via SUBCUTANEOUS
  Filled 2014-07-13 (×2): qty 0.4

## 2014-07-13 MED ORDER — NITROGLYCERIN 2 % TD OINT
1.0000 [in_us] | TOPICAL_OINTMENT | Freq: Once | TRANSDERMAL | Status: AC
Start: 2014-07-13 — End: 2014-07-13
  Administered 2014-07-13: 1 [in_us] via TOPICAL
  Filled 2014-07-13: qty 1

## 2014-07-13 MED ORDER — ENALAPRIL MALEATE 10 MG PO TABS
10.0000 mg | ORAL_TABLET | Freq: Every day | ORAL | Status: DC
  Administered 2014-07-14 – 2014-07-15 (×2): 10 mg via ORAL
  Filled 2014-07-13 (×2): qty 1

## 2014-07-13 MED ORDER — ASPIRIN EC 81 MG PO TBEC
81.0000 mg | DELAYED_RELEASE_TABLET | Freq: Every day | ORAL | Status: DC
Start: 2014-07-14 — End: 2014-07-15
  Administered 2014-07-14 – 2014-07-15 (×2): 81 mg via ORAL
  Filled 2014-07-13 (×2): qty 1

## 2014-07-13 NOTE — ED Notes (Signed)
Spoke with Jarrett Soho, PA-C. Cardiology is to write admission orders for this patient.

## 2014-07-13 NOTE — ED Notes (Addendum)
Pt c/o substernal chest pain that began today at 1230.  Pt on xarelto for hx of PE per pt.  Per EMS, EDP stated to bring pt in for ekg that was showing borderline stemi.  Pt took 1 nitro with some relief.  Pt was diaphoretic on scene, but is no longer.

## 2014-07-13 NOTE — ED Provider Notes (Signed)
CSN: [id number]     Arrival date & time 07/13/14  1732 History   First MD Initiated Contact with Patient 07/13/14 1750     Chief Complaint  Patient presents with  . Chest Pain     (Consider location/radiation/quality/duration/timing/severity/associated sxs/prior Treatment) The history is provided by the patient and medical records. No language interpreter was used.    Stephen [name] is a 53 y.o. male  with a hx of HTN, systolic heart failure, CAD, PE presents to the Emergency Department complaining of gradual, persistent, progressively worsening substernal chest pain onset 12:00 with radiation into the right chest and left jaw. Associated symptoms include diaphoresis, nausea and vomiting.  Pt with Hx of MI with subsequent CA; now with 2 stints and AICD in 2006.  Pt given nitro enroute with some relief.  Pt unsure if pain is exertion, but is not worse with movement.  Pt had PE in 2012 and is chronically anticoagulated with Xarelto at this time.  Pt reports associated symptoms have resolved.  Pt denies fever, chills, headache, abd pain, syncope.    Cardiology:  Dr. Caryl Comes  Record review shows Last measured ejection fraction by echo in 2013 demonstrating an EF of 40-45%.      Past Medical History  Diagnosis Date  . Obesity   . Hypertension   . Hyperlipidemia   . Systolic heart failure   . Coronary artery disease     native vessel  . AICD (automatic cardioverter/defibrillator) present 2006    [place] 7232  . Ischemic cardiomyopathy     ischemic  . OSA (obstructive sleep apnea)   . [place] Paroxysmal ventricular tachycardia     Rx via [facility] 2008  . Pulmonary embolism   . MI, old    Past Surgical History  Procedure Laterality Date  . Cardiac defibrillator placement  2006    Medtronic Maximo 7232  . Knee surgery      left    No family history on file. History  Substance Use Topics  . Smoking status: Current Every Day Smoker -- 1.00 packs/day    Types:  Cigarettes  . Smokeless tobacco: Never Used  . Alcohol Use: Yes     Comment: socially    Review of Systems  Constitutional: Positive for diaphoresis. Negative for fever, appetite change, fatigue and unexpected weight change.  HENT: Negative for mouth sores.   Eyes: Negative for visual disturbance.  Respiratory: Positive for chest tightness and shortness of breath. Negative for cough and wheezing.   Cardiovascular: Positive for chest pain.  Gastrointestinal: Positive for nausea and vomiting. Negative for abdominal pain, diarrhea and constipation.  Endocrine: Negative for polydipsia, polyphagia and polyuria.  Genitourinary: Negative for dysuria, urgency, frequency and hematuria.  Musculoskeletal: Negative for back pain and neck stiffness.  Skin: Negative for rash.  Allergic/Immunologic: Negative for immunocompromised state.  Neurological: Negative for syncope, light-headedness and headaches.  Hematological: Does not bruise/bleed easily.  Psychiatric/Behavioral: Negative for sleep disturbance. The patient is not nervous/anxious.       Allergies  Ativan and Haldol  Home Medications   Prior to Admission medications   Medication Sig Start Date End Date Taking? Authorizing Provider  amLODipine (NORVASC) 5 MG tablet Take 5 mg by mouth daily.   Yes Historical Provider, MD  bisoprolol (ZEBETA) 5 MG tablet Take 5 mg by mouth 2 (two) times daily.   Yes Historical Provider, MD  cholecalciferol (VITAMIN D) 1000 UNITS tablet Take 1,000 Units by mouth daily.  Yes Historical Provider, MD  divalproex (DEPAKOTE ER) 500 MG 24 hr tablet Take 500 mg by mouth at bedtime.    Yes Historical Provider, MD  enalapril (VASOTEC) 10 MG tablet Take 10 mg by mouth daily.     Yes Historical Provider, MD  folic acid (FOLVITE) 1 MG tablet Take 1 mg by mouth daily. 11/17/13  Yes Historical Provider, MD  furosemide (LASIX) 40 MG tablet Take 40 mg by mouth daily.   Yes Historical Provider, MD  KLOR-CON M20 20 MEQ  tablet Take 20 mEq by mouth daily. 11/17/13  Yes Historical Provider, MD  mometasone (NASONEX) 50 MCG/ACT nasal spray Place 2 sprays into the nose daily as needed. For nasal congestion   Yes Historical Provider, MD  nitroGLYCERIN (NITROSTAT) 0.4 MG SL tablet Place 0.4 mg under the tongue every 5 (five) minutes as needed for chest pain.   Yes Historical Provider, MD  omeprazole (PRILOSEC) 40 MG capsule Take 40 mg by mouth daily.   Yes Historical Provider, MD  rivaroxaban (XARELTO) 20 MG TABS tablet Take 20 mg by mouth daily with supper.   Yes Carole Civil, MD  rosuvastatin (CRESTOR) 5 MG tablet Take 5 mg by mouth every other day.    Yes Historical Provider, MD  tetrahydrozoline-zinc (VISINE-AC) 0.05-0.25 % ophthalmic solution Place 2 drops into both eyes 3 (three) times daily as needed (redness relief).   Yes Historical Provider, MD   BP 137/81  Pulse 59  Temp(Src) 97.6 F (36.4 C) (Oral)  Resp 15  Ht 6' (1.829 m)  Wt 265 lb 14 oz (120.6 kg)  BMI 36.05 kg/m2  SpO2 93% Physical Exam  Nursing note and vitals reviewed. Constitutional: He is oriented to person, place, and time. He appears well-developed and well-nourished. No distress.  Awake, alert, nontoxic appearance  HENT:  Head: Normocephalic and atraumatic.  Mouth/Throat: Oropharynx is clear and moist. No oropharyngeal exudate.  Eyes: Conjunctivae are normal. No scleral icterus.  Neck: Normal range of motion. Neck supple.  Cardiovascular: Normal rate, regular rhythm, normal heart sounds and intact distal pulses.   No murmur heard. Pulmonary/Chest: Effort normal and breath sounds normal. No respiratory distress. He has no wheezes. He has no rales.  Equal chest expansion Clear and equal breath sounds  Abdominal: Soft. Bowel sounds are normal. He exhibits no mass. There is no tenderness. There is no rebound and no guarding.  Abdomen soft and nontender  Musculoskeletal: Normal range of motion. He exhibits no edema.  No peripheral  edema  Lymphadenopathy:    He has no cervical adenopathy.  Neurological: He is alert and oriented to person, place, and time.  Speech is clear and goal oriented Moves extremities without ataxia  Skin: Skin is warm and dry. He is not diaphoretic. No erythema.  Psychiatric: He has a normal mood and affect.    ED Course  Procedures (including critical care time) Labs Review Labs Reviewed  BASIC METABOLIC PANEL - Abnormal; Notable for the following:    Glucose, Bld 106 (*)    All other components within normal limits  CBC  TROPONIN I  TROPONIN I  TROPONIN I  TROPONIN I  CBC  CREATININE, SERUM  BASIC METABOLIC PANEL  LIPID PANEL  I-STAT TROPOININ, ED    Imaging Review Dg Chest 2 View  07/13/2014   CLINICAL DATA:  Right chest pain  EXAM: CHEST  2 VIEW  COMPARISON:  12/07/2013  FINDINGS: Lungs are clear.  No pleural effusion or pneumothorax.  Heart is normal  in size.  Left subclavian ICD.  IMPRESSION: No evidence of acute cardiopulmonary disease.   Electronically Signed   By: Julian Hy M.D.   On: 07/13/2014 18:33     EKG Interpretation   Date/Time:  Monday July 13 2014 17:41:36 EDT Ventricular Rate:  86 PR Interval:  164 QRS Duration: 108 QT Interval:  387 QTC Calculation: 463 R Axis:   58 Text Interpretation:  Sinus rhythm Inferior infarct, old No significant  change since last tracing Confirmed by POLLINA  MD, [place] [phone]) on  07/13/2014 7:32:00 PM         MDM   Final diagnoses:  Other chest pain  Non-intractable vomiting with nausea, vomiting of unspecified type  Cardiac defibrillator-Medtronic-single-chamber  HYPERTENSION, UNSPECIFIED  CARDIOMYOPATHY, ISCHEMIC  SYSTOLIC HEART FAILURE, CHRONIC   [name] presents with concerning symptoms of ACS. Patient with sudden onset chest pain, diaphoresis, nausea and one episode of vomiting. Patient reports the symptoms today are the same as his previous MI.  Patient worked in improving pain  after nitroglycerin.  Labs pending, but pt is Moderate risk using the HEART score.  EKG without STEMI.  9:06 PM Repeat EKG here in the emergency department in unchanged from the first.  Troponin negative. Patient with some improvement in chest pain however not complete resolution.  Discussed with cardiology who will evaluate for admission and further work-up.    BP 137/81  Pulse 59  Temp(Src) 97.6 F (36.4 C) (Oral)  Resp 15  Ht 6' (1.829 m)  Wt 265 lb 14 oz (120.6 kg)  BMI 36.05 kg/m2  SpO2 93%   Abigail Butts, PA-C 07/14/14 0132

## 2014-07-13 NOTE — H&P (Signed)
[place]  Assessment and Plan:  *Chest pain: Stephen [name] is a 53 year old male with history of CAD s/p RCA PCI, ICM EF 40-45%, HTN, HLD comes to the ED with nausea, vomiting and substernal/epigastric pain that started this afternoon. Patient feels that some features of the pain are similar to his previous MI in 2006. However, there are features that are atypical such as improvement with position. Due to history prior cardiac history patient should be admitted for a chest pain rule out.   Admission EKG shows no changes suggestive of ischemia. His first set of troponins is negative.  -- Will keep NPO s midnight for possible noninvasive risk stratification. -- Continue aspirin 81mg , bisoprolol, atorvastatin 10mg , enalapril.  -- Will hold rivaroxaban for now should he need a LHC.  -- Get FLP in AM -- Will get a TTE.   *ICM EF 40-45%: currently NYHA class I. Volume status is normal -- Continue enalapril, furosemide, bisoprolol  *Hx of VT: NSR. 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 Patient was in his usual state of health until after lunch when he noticed substernal/epigastric pain with associated nausea and nonbloody emesis. He describes the pain as sharp without any radiation. He endorses some SOB with the episode which last for about 40 minutes. He rested and woke up with similar pain, which led him to contact EMS. EMS gave him 4 aspirins and NTG, which out improvement in his pain. In the ED, patient was given more SL NTG and NTG paste with gradual improvement in his pain. His current pain level is 2-3/10 and  appears somewhat positional. Sitting in the bed at a certain angle helps his pain. He ambulated earlier without worsening in the symptoms. Of note, he believes current symptoms remind him of pain prior to his MI in 2006.  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 EF 28 percent. Large full thickness scar involving the inferior septum and inferior wall from apex to base. No MR Normal right-sided cardiac chambers. No apical or left atrial appendage clot.  Prior cath: 2006: RCA PCI  Past Medical History Past Medical History  Diagnosis Date  . Obesity   . Hypertension   . Hyperlipidemia   . Systolic heart failure   . Coronary artery disease     native vessel  . AICD (automatic cardioverter/defibrillator) present 2006    [place] 7232  . Ischemic cardiomyopathy     ischemic  . OSA (obstructive sleep apnea)   . [place] Paroxysmal ventricular tachycardia     Rx via [facility] 2008  . Pulmonary embolism   . MI, old    Medications: No current facility-administered medications on file prior to encounter.   Current Outpatient Prescriptions on File Prior to Encounter  Medication Sig Dispense Refill  .  amLODipine (NORVASC) 5 MG tablet Take 5 mg by mouth daily.      . bisoprolol (ZEBETA) 5 MG tablet Take 5 mg by mouth 2 (two) times daily.      . cholecalciferol (VITAMIN D) 1000 UNITS tablet Take 1,000 Units by mouth daily.        . divalproex (DEPAKOTE ER) 500 MG 24 hr tablet Take 500 mg by mouth at bedtime.       . enalapril (VASOTEC) 10 MG tablet Take 10 mg by mouth daily.        . folic acid (FOLVITE) 1 MG tablet Take 1 mg by mouth daily.      . furosemide (LASIX) 40 MG tablet Take 40 mg by mouth daily.       Marland Kitchen KLOR-CON M20 20 MEQ tablet Take 20 mEq by mouth daily.      . mometasone (NASONEX) 50 MCG/ACT nasal spray Place 2 sprays into the nose daily as needed. For nasal congestion      . nitroGLYCERIN (NITROSTAT) 0.4 MG SL tablet Place 0.4 mg under the tongue every 5 (five) minutes as needed for chest pain.      Marland Kitchen omeprazole (PRILOSEC) 40 MG capsule Take 40 mg by mouth daily.      . rosuvastatin (CRESTOR) 5 MG tablet Take 5 mg by mouth every other day.        Rivaroxaban 20mg  daily     . tetrahydrozoline-zinc (VISINE-AC) 0.05-0.25 % ophthalmic solution Place 2 drops into both eyes 3 (three) times daily as needed (redness relief).       Allergies: Allergies  Allergen Reactions  . Ativan [Lorazepam]     Went crazy  . Haldol [Haloperidol Lactate]     Went crazy     Social History History   Social History  . Marital Status: Married    Spouse Name: N/A    Number of Children: N/A  . Years of Education: N/A   Occupational History  . Not on file.   Social History Main Topics  . Smoking status: Current Every Day Smoker -- 1.00 packs/day    Types: Cigarettes  . Smokeless tobacco: Never Used  . Alcohol Use: Yes     Comment: socially  . Drug Use: Yes    Special: Marijuana     Comment: last used 11/06/13  . Sexual Activity: Not on file   Other Topics Concern  . Not on file   Social History Narrative  . No narrative on file    Family History No family history on file.  Physical Exam Filed Vitals:   07/13/14 2215  BP: 122/84  Pulse: 75  Temp:   Resp: 15  Physical Exam  Constitutional: He appears healthy. No distress.  Eyes: Conjunctivae are normal.  Neck: No JVD present.  Cardiovascular: Regular rhythm, S1 normal, S2 normal and normal heart sounds.  PMI is not displaced.  Exam reveals no gallop.   No murmur heard. Pulses:      Carotid pulses are 2+ on the right side, and 2+ on the left side.      Radial pulses are 2+ on the right side, and 2+ on the left side.        Femoral pulses are 2+ on the right side, and 2+ on the left side.      Popliteal pulses are 2+ on the right side, and 2+ on the left side.       Dorsalis pedis pulses are 2+ on the right side,  and 2+ on the left side.       Posterior tibial pulses are 2+ on the right side, and 2+ on the left side.  Pulmonary/Chest: Breath sounds normal.  Abdominal: Soft. Bowel sounds are normal. He exhibits no distension and no mass. There is no tenderness.  Musculoskeletal: He exhibits no edema.  Skin: Skin is warm.   Labs:  CHEM: 140 / 4.3 / 103 / 22 / 10 / 0.89 / 106   WBC: 10.4 / 14 / 41.6 /224  Troponins <0.30

## 2014-07-13 NOTE — ED Notes (Signed)
Called report to 3E.  Clarise Cruz, receiving RN is charge, and is concerned about chest pain being placed on floor. Will contact AC/placement.

## 2014-07-13 NOTE — ED Notes (Signed)
Dr. Posey Pronto paged for admission orders. MD acknowledges, and is currently entering orders.

## 2014-07-13 NOTE — ED Notes (Signed)
Discussed plan of care with Dr. Posey Pronto, and that active chest pain must go to stepdown floor.  He gives verbal order for stepdown bed.  Will notify patient placement.

## 2014-07-13 NOTE — ED Notes (Signed)
Patient asked for and received a cup of crushed ice.

## 2014-07-13 NOTE — ED Notes (Signed)
Spoke with patient placement, patient must go to stepdown unit with active chest pain, will contact admitting MD.

## 2014-07-13 NOTE — ED Notes (Signed)
Dr. Posey Pronto paged at [phone]

## 2014-07-13 NOTE — ED Notes (Signed)
Notified 3E that the patient is now going to stepdown.

## 2014-07-13 NOTE — ED Notes (Signed)
No stepdown beds available at this time.

## 2014-07-13 NOTE — ED Notes (Signed)
Called Maggie in main pharmacy in regards to medications, depakote ER and zebeta for administration in the ER while awaiting new bed assignment.  Pharmacy reports that lovenox will be started tomorrow.

## 2014-07-14 ENCOUNTER — Inpatient Hospital Stay (HOSPITAL_COMMUNITY): Payer: Medicare PPO

## 2014-07-14 DIAGNOSIS — R0789 Other chest pain: Secondary | ICD-10-CM

## 2014-07-14 DIAGNOSIS — I200 Unstable angina: Principal | ICD-10-CM

## 2014-07-14 DIAGNOSIS — R079 Chest pain, unspecified: Secondary | ICD-10-CM

## 2014-07-14 DIAGNOSIS — I2510 Atherosclerotic heart disease of native coronary artery without angina pectoris: Secondary | ICD-10-CM

## 2014-07-14 DIAGNOSIS — I10 Essential (primary) hypertension: Secondary | ICD-10-CM

## 2014-07-14 DIAGNOSIS — Z95810 Presence of automatic (implantable) cardiac defibrillator: Secondary | ICD-10-CM

## 2014-07-14 DIAGNOSIS — E785 Hyperlipidemia, unspecified: Secondary | ICD-10-CM

## 2014-07-14 LAB — BASIC METABOLIC PANEL
Anion gap: 15 (ref 5–15)
BUN: 12 mg/dL (ref 6–23)
CO2: 23 meq/L (ref 19–32)
CREATININE: 0.97 mg/dL (ref 0.50–1.35)
Calcium: 9.2 mg/dL (ref 8.4–10.5)
Chloride: 100 mEq/L (ref 96–112)
GFR calc non Af Amer: >90 mL/min (ref >90)
Glucose, Bld: 80 mg/dL (ref 70–99)
Potassium: 4.2 mEq/L (ref 3.7–5.3)
Sodium: 138 mEq/L (ref 137–147)

## 2014-07-14 LAB — CBC
HCT: 42.2 % (ref 39.0–52.0)
Hemoglobin: 13.8 g/dL (ref 13.0–17.0)
MCH: 31.9 pg (ref 26.0–34.0)
MCHC: 32.7 g/dL (ref 30.0–36.0)
MCV: 97.5 fL (ref 78.0–100.0)
Platelets: 221 10*3/uL (ref 150–400)
RBC: 4.33 MIL/uL (ref 4.22–5.81)
RDW: 13.9 % (ref 11.5–15.5)
WBC: 8.9 10*3/uL (ref 4.0–10.5)

## 2014-07-14 LAB — MRSA PCR SCREENING: MRSA by PCR: NEGATIVE

## 2014-07-14 LAB — TROPONIN I

## 2014-07-14 LAB — LIPID PANEL
CHOL/HDL RATIO: 1.7 ratio
Cholesterol: 95 mg/dL (ref 0–200)
HDL: 55 mg/dL (ref >39)
LDL Cholesterol: 23 mg/dL (ref 0–99)
Triglycerides: 85 mg/dL (ref <150)
VLDL: 17 mg/dL (ref 0–40)

## 2014-07-14 LAB — CREATININE, SERUM
CREATININE: 1 mg/dL (ref 0.50–1.35)
GFR, EST NON AFRICAN AMERICAN: 83 mL/min — AB (ref >90)

## 2014-07-14 MED ORDER — REGADENOSON 0.4 MG/5ML IV SOLN
0.4000 mg | Freq: Once | INTRAVENOUS | Status: AC
  Administered 2014-07-14: 0.4 mg via INTRAVENOUS

## 2014-07-14 MED ORDER — ASPIRIN 300 MG RE SUPP: 300.0000 mg | RECTAL | Status: DC

## 2014-07-14 MED ORDER — TECHNETIUM TC 99M SESTAMIBI GENERIC - CARDIOLITE
30.0000 | Freq: Once | INTRAVENOUS | Status: AC | PRN
  Administered 2014-07-14: 30 via INTRAVENOUS

## 2014-07-14 MED ORDER — ACETAMINOPHEN 325 MG PO TABS: 650.0000 mg | ORAL_TABLET | ORAL | Status: DC | PRN

## 2014-07-14 MED ORDER — NITROGLYCERIN 0.4 MG SL SUBL: 0.4000 mg | SUBLINGUAL_TABLET | SUBLINGUAL | Status: DC | PRN

## 2014-07-14 MED ORDER — SODIUM CHLORIDE 0.9 % IJ SOLN: 3.0000 mL | INTRAMUSCULAR | Status: DC | PRN

## 2014-07-14 MED ORDER — SODIUM CHLORIDE 0.9 % IJ SOLN
3.0000 mL | Freq: Two times a day (BID) | INTRAMUSCULAR | Status: DC
  Administered 2014-07-14 – 2014-07-15 (×4): 3 mL via INTRAVENOUS

## 2014-07-14 MED ORDER — ASPIRIN 81 MG PO CHEW: 324.0000 mg | CHEWABLE_TABLET | ORAL | Status: DC

## 2014-07-14 MED ORDER — REGADENOSON 0.4 MG/5ML IV SOLN
INTRAVENOUS | Status: AC
  Filled 2014-07-14: qty 5

## 2014-07-14 MED ORDER — ONDANSETRON HCL 4 MG/2ML IJ SOLN: 4.0000 mg | Freq: Four times a day (QID) | INTRAMUSCULAR | Status: DC | PRN

## 2014-07-14 MED ORDER — SODIUM CHLORIDE 0.9 % IV SOLN: 250.0000 mL | INTRAVENOUS | Status: DC | PRN

## 2014-07-14 MED ORDER — TECHNETIUM TC 99M SESTAMIBI GENERIC - CARDIOLITE
10.0000 | Freq: Once | INTRAVENOUS | Status: AC | PRN
  Administered 2014-07-14: 10 via INTRAVENOUS

## 2014-07-14 NOTE — Progress Notes (Signed)
Patient's treadmill stress test was converted to lexiscan stress test as he had a h/o bad L knee with torn ligament and was hesitant if he can reach target HR on treadmill. Dr. Angelena Form notified.  Patient's Lexiscan stress test completed without significant complication. Pending result by [facility] heartcare reader.  Hilbert Corrigan PA Pager: [phone]

## 2014-07-14 NOTE — Progress Notes (Signed)
Patient's stress test came back high risk mainly due to low EF 24%, large inferior scar, however no ischemia. Dr. Angelena Form notified, will keep overnight for observation and discharge in am if no CP.  Hilbert Corrigan PA Pager: [phone]

## 2014-07-14 NOTE — ED Provider Notes (Signed)
Medical screening examination/treatment/procedure(s) were conducted as a shared visit with non-physician practitioner(s) and myself.  I personally evaluated the patient during the encounter.   EKG Interpretation   Date/Time:  Monday July 13 2014 17:41:36 EDT Ventricular Rate:  86 PR Interval:  164 QRS Duration: 108 QT Interval:  387 QTC Calculation: 463 R Axis:   58 Text Interpretation:  Sinus rhythm Inferior infarct, old No significant  change since last tracing Confirmed by Nichoals Heyde  MD, Wissam Resor [phone]) on  07/13/2014 7:32:00 PM      PAtient presented with complaints of chest pain with radiation pattern to left jaw. Patient with history of CAD, pattern and presentation concerning for cardiac chest pain, but initial workup negative. Will admit for further eval.   Orpah Greek, MD 07/14/14 1504

## 2014-07-14 NOTE — Progress Notes (Signed)
     SUBJECTIVE: No chest pain this am. Feels better. Nausea resolved. No SOB.   BP 137/81  Pulse 59  Temp(Src) 97.6 F (36.4 C) (Oral)  Resp 15  Ht 6' (1.829 m)  Wt 265 lb 14 oz (120.6 kg)  BMI 36.05 kg/m2  SpO2 93% No intake or output data in the 24 hours ending 07/14/14 0804  PHYSICAL EXAM General: Well developed, well nourished, in no acute distress. Alert and oriented x 3.  Psych:  Good affect, responds appropriately Neck: No JVD. No masses noted.  Lungs: Clear bilaterally with no wheezes or rhonci noted.  Heart: RRR with no murmurs noted. Abdomen: Bowel sounds are present. Soft, non-tender.  Extremities: No lower extremity edema.   LABS: Basic Metabolic Panel:  Recent Labs  07/13/14 1751 07/14/14 0436  NA 140 138  K 4.3 4.2  CL 103 100  CO2 22 23  GLUCOSE 106* 80  BUN 10 12  CREATININE 0.89 1.00  0.97  CALCIUM 8.9 9.2   CBC:  Recent Labs  07/13/14 1751 07/14/14 0436  WBC 10.4 8.9  HGB 14.0 13.8  HCT 41.6 42.2  MCV 96.7 97.5  PLT 224 221   Cardiac Enzymes:  Recent Labs  07/13/14 1857 07/14/14 0436  TROPONINI <0.30 <0.30   Fasting Lipid Panel:  Recent Labs  07/14/14 0436  CHOL 95  HDL 55  LDLCALC 23  TRIG 85  CHOLHDL 1.7    Current Meds: . amLODipine  5 mg Oral Daily  . aspirin EC  81 mg Oral Daily  . atorvastatin  10 mg Oral q1800  . bisoprolol  5 mg Oral BID  . divalproex  500 mg Oral QHS  . enalapril  10 mg Oral Daily  . enoxaparin (LOVENOX) injection  40 mg Subcutaneous Q24H  . fluticasone  2 spray Each Nare Daily  . furosemide  40 mg Oral Daily  . pantoprazole  40 mg Oral Daily  . potassium chloride SA  20 mEq Oral Daily  . sodium chloride  3 mL Intravenous Q12H    ASSESSMENT AND PLAN: Stephen [name] is a 53 year old male with history of CAD s/p RCA PCI, ICM EF 40-45%, HTN, HLD admitted with nausea, vomiting and substernal/epigastric pain.   1. CAD/Unstable angina: Cardiac markers are negative. EKG is unchanged from 2014  with old inferior infarct noted. His chest pain is positional and now mostly resolved. He does have a history of CAD with prior stents in the RCA. Will arrange exercise stress myoview this am to exclude ischemia.   2. Ischemic cardiomyopathy: Continue medical therapy.   3. History of VT: ICD in place.   4. HTN: BP controlled.  5. HLD: Continue statin    Stephen [name]  9/8/20158:04 AM

## 2014-07-15 DIAGNOSIS — R0789 Other chest pain: Secondary | ICD-10-CM

## 2014-07-15 DIAGNOSIS — I2589 Other forms of chronic ischemic heart disease: Secondary | ICD-10-CM

## 2014-07-15 DIAGNOSIS — I255 Ischemic cardiomyopathy: Secondary | ICD-10-CM | POA: Diagnosis present

## 2014-07-15 MED ORDER — ASPIRIN 81 MG PO TBEC: 81.0000 mg | DELAYED_RELEASE_TABLET | Freq: Every day | ORAL | Status: DC

## 2014-07-15 NOTE — Discharge Instructions (Signed)

## 2014-07-15 NOTE — Progress Notes (Signed)
     SUBJECTIVE: No chest pain or SOB  BP 124/75  Pulse 70  Temp(Src) 97.9 F (36.6 C) (Oral)  Resp 13  Ht 6' (1.829 m)  Wt 267 lb 3.2 oz (121.2 kg)  BMI 36.23 kg/m2  SpO2 92%  Intake/Output Summary (Last 24 hours) at 07/15/14 0715 Last data filed at 07/15/14 0300  Gross per 24 hour  Intake      3 ml  Output   1600 ml  Net  -1597 ml    PHYSICAL EXAM General: Well developed, well nourished, in no acute distress. Alert and oriented x 3.  Psych:  Good affect, responds appropriately Neck: No JVD. No masses noted.  Lungs: Clear bilaterally with no wheezes or rhonci noted.  Heart: RRR with no murmurs noted. Abdomen: Bowel sounds are present. Soft, non-tender.  Extremities: No lower extremity edema.   LABS: Basic Metabolic Panel:  Recent Labs  07/13/14 1751 07/14/14 0436  NA 140 138  K 4.3 4.2  CL 103 100  CO2 22 23  GLUCOSE 106* 80  BUN 10 12  CREATININE 0.89 1.00  0.97  CALCIUM 8.9 9.2   CBC:  Recent Labs  07/13/14 1751 07/14/14 0436  WBC 10.4 8.9  HGB 14.0 13.8  HCT 41.6 42.2  MCV 96.7 97.5  PLT 224 221   Cardiac Enzymes:  Recent Labs  07/13/14 1857 07/14/14 0436 07/14/14 1040  TROPONINI <0.30 <0.30 <0.30   Fasting Lipid Panel:  Recent Labs  07/14/14 0436  CHOL 95  HDL 55  LDLCALC 23  TRIG 85  CHOLHDL 1.7    Current Meds: . amLODipine  5 mg Oral Daily  . aspirin EC  81 mg Oral Daily  . atorvastatin  10 mg Oral q1800  . bisoprolol  5 mg Oral BID  . divalproex  500 mg Oral QHS  . enalapril  10 mg Oral Daily  . enoxaparin (LOVENOX) injection  40 mg Subcutaneous Q24H  . fluticasone  2 spray Each Nare Daily  . furosemide  40 mg Oral Daily  . pantoprazole  40 mg Oral Daily  . potassium chloride SA  20 mEq Oral Daily  . sodium chloride  3 mL Intravenous Q12H   Stress myoview 07/14/14: High risk nuclear study with no chest pain and no new  electrocardiographic changes. The scintigraphic results show a large  prior inferior infarct.  No ischemia. The gated ejection fraction was  24% with inferior akinesis. Evidence of left ventricular  enlargement. Study felt to be high risk because of reduced LV  function.   ASSESSMENT AND PLAN:  Mr. Wisler is a 53 year old male with history of CAD s/p RCA PCI, ICM EF 40-45%, HTN, HLD admitted with nausea, vomiting and substernal/epigastric pain.   1. CAD/Unstable angina: Cardiac markers are negative. EKG is unchanged from 2014 with old inferior infarct noted. His chest pain is now resolved. Was positional and atypical. He does have a history of CAD with prior stents in the RCA. Stress myoview with inferior scar but no ischemia. Continue medical management of CAD.   2. Ischemic cardiomyopathy: Continue medical therapy.   3. History of VT: ICD in place.   4. HTN: BP controlled.   5. HLD: Continue statin   6. Dispo: D/C home today. Follow up with Dr. Caryl Comes in 2-3 weeks.    Denita Lun  9/9/20157:15 AM

## 2014-07-15 NOTE — Discharge Summary (Signed)
See full note this am. cdm 

## 2014-07-15 NOTE — Discharge Summary (Signed)
Discharge Summary   Patient ID: MEHUL RUDIN MRN: [id number], DOB/AGE: 09-Oct-1961 53 y.o. Admit date: 07/13/2014 D/C date:     07/15/2014  Primary Cardiologist: Dr. Caryl Comes  Principal Problem:   Chest pain Active Problems:   HYPERLIPIDEMIA-MIXED   OVERWEIGHT/OBESITY   HYPERTENSION, UNSPECIFIED   CAD, NATIVE VESSEL   SYSTOLIC HEART FAILURE, CHRONIC   Ventricular tachycardia   6949 lead   Cardiomyopathy, ischemic    Admission Dates: 07/13/14-07/15/14 Discharge Diagnosis: Chest pain s/p stress myoview with no ischemia. Continued on medical therapy.  HPI: Stephen [name] is a 53 y.o. male with a history of CAD s/p RCA PCI, ICM EF 40-45%, HTN, HLD and hx of a PE (2012) on xarelto who was admitted to [facility] on 07/13/14 with nausea, vomiting and substernal/epigastric pain.    Hospital Course  CAD/Chest pain: Hx of stenting to RCA -- Cardiac markers returned negative. EKG unchanged from 2014 with old inferior infarct noted. -- His chest pain was positional and atypical and is now resolved.  -- Patient's stress test returned high risk mainly due to low EF 24% and large inferior scar, however no ischemia. Dr. Angelena Form notified and patient kept overnight for observation with plan for discharge in AM if no CP. He has done well overnight. (Full report below). -- Continue medical management of CAD with ASA, statin and BB  Ischemic cardiomyopathy/ systolic CHF: EF 97-02% by ECHO 2013. EF 24% by nuclear imaging on this admission.  -- Continue medical therapy with enalapril 10mg  qd and bisoprolol 5mg  BID. -- Continue Lasix 40mg  qd/ K-dur 51mEq qd.  -- Appears euvolemic   History of VT: ICD in place.   HTN: BP controlled.  -- Continue amlodipine 5mg  qd, enalapril 10mg  qd and bisoprolol 5mg  BID.  HLD: Continue statin   Hx of PE- on Rivaroxaban. This was discontinued upon admission in the case that he may need a cath and will be resumed at discharge.   The patient has had an uncomplicated  hospital course and is recovering well. He has been seen by Dr. Julianne Handler today and deemed ready for discharge home. All follow-up appointments have been scheduled.  Discharge medications are listed below. No changes have been made.    Discharge Vitals: Blood pressure 124/75, pulse 70, temperature 97.9 F (36.6 C), temperature source Oral, resp. rate 13, height 6' (1.829 m), weight 267 lb 3.2 oz (121.2 kg), SpO2 92.00%.  Labs: Lab Results  Component Value Date   WBC 8.9 07/14/2014   HGB 13.8 07/14/2014   HCT 42.2 07/14/2014   MCV 97.5 07/14/2014   PLT 221 07/14/2014     Recent Labs Lab 07/14/14 0436  NA 138  K 4.2  CL 100  CO2 23  BUN 12  CREATININE 1.00  0.97  CALCIUM 9.2  GLUCOSE 80    Recent Labs  07/13/14 1857 07/14/14 0436 07/14/14 1040  TROPONINI <0.30 <0.30 <0.30   Lab Results  Component Value Date   CHOL 95 07/14/2014   HDL 55 07/14/2014   LDLCALC 23 07/14/2014   TRIG 85 07/14/2014     Diagnostic Studies/Procedures   Dg Chest 2 View  07/13/2014   CLINICAL DATA:  Right chest pain  EXAM: CHEST  2 VIEW  COMPARISON:  12/07/2013  FINDINGS: Lungs are clear.  No pleural effusion or pneumothorax.  Heart is normal in size.  Left subclavian ICD.  IMPRESSION: No evidence of acute cardiopulmonary disease.     Nm Myocar Multi W/spect W/wall Motion /  Ef  07/14/2014   CLINICAL DATA:  Chest pain  EXAM: Lexiscan Myovue  TECHNIQUE: The patient received IV Lexiscan .4mg  over 15 seconds. 33.0 mCi of Technetium 100m Sestamibi injected at 30 seconds. Quantitative SPECT images were obtained in the vertical, horizontal and short axis planes after a 45 minute delay. Rest images were obtained with similar planes and delay using 10.2 mCi of Technetium 67m Sestamibi.  FINDINGS: ECG: Baseline ECG showed sinus rhythm with prior inferior MI. Baseline BP 110/75; pulse 67. With lexiscan infusion; no ST changes; peak BP 138/74; pulse 96.  Symptoms:  No Chest pain.  RAW Data:  Adequate acquisition.   Quantitiative Gated SPECT EF: 24; inferior akinesis; TID 1.13; EDV-246 ml; ESV-186 ml. Evidence of LVE.  Perfusion Images: The stress images reveal a large, severe intensity defect in the inferior wall. When compared to the rest images there is no significant reversibility noted.  IMPRESSION: High risk nuclear study with no chest pain and no new electrocardiographic changes. The scintigraphic results show a large prior inferior infarct. No ischemia. The gated ejection fraction was 24% with inferior akinesis. Evidence of left ventricular enlargement. Study felt to be high risk because of reduced LV function.   Discharge Medications     Medication List         amLODipine 5 MG tablet  Commonly known as:  NORVASC  Take 5 mg by mouth daily.     aspirin 81 MG EC tablet  Take 1 tablet (81 mg total) by mouth daily.     bisoprolol 5 MG tablet  Commonly known as:  ZEBETA  Take 5 mg by mouth 2 (two) times daily.     cholecalciferol 1000 UNITS tablet  Commonly known as:  VITAMIN D  Take 1,000 Units by mouth daily.     divalproex 500 MG 24 hr tablet  Commonly known as:  DEPAKOTE ER  Take 500 mg by mouth at bedtime.     enalapril 10 MG tablet  Commonly known as:  VASOTEC  Take 10 mg by mouth daily.     folic acid 1 MG tablet  Commonly known as:  FOLVITE  Take 1 mg by mouth daily.     furosemide 40 MG tablet  Commonly known as:  LASIX  Take 40 mg by mouth daily.     KLOR-CON M20 20 MEQ tablet  Generic drug:  potassium chloride SA  Take 20 mEq by mouth daily.     mometasone 50 MCG/ACT nasal spray  Commonly known as:  NASONEX  Place 2 sprays into the nose daily as needed. For nasal congestion     nitroGLYCERIN 0.4 MG SL tablet  Commonly known as:  NITROSTAT  Place 0.4 mg under the tongue every 5 (five) minutes as needed for chest pain.     omeprazole 40 MG capsule  Commonly known as:  PRILOSEC  Take 40 mg by mouth daily.     rivaroxaban 20 MG Tabs tablet  Commonly known as:   XARELTO  Take 20 mg by mouth daily with supper.     rosuvastatin 5 MG tablet  Commonly known as:  CRESTOR  Take 5 mg by mouth every other day.     tetrahydrozoline-zinc 0.05-0.25 % ophthalmic solution  Commonly known as:  VISINE-AC  Place 2 drops into both eyes 3 (three) times daily as needed (redness relief).        Disposition   The patient will be discharged in stable condition to home.  Follow-up Information  Follow up with Virl Axe, MD On 08/06/2014. (@ 4:15pm)    Specialty:  Cardiology   Contact information:   [address] [place] [address] [phone]         Duration of Discharge Encounter: Greater than 30 minutes including physician and PA time.  Mable Fill R PA-C 07/15/2014, 8:33 AM

## 2014-08-06 ENCOUNTER — Encounter: Payer: Medicare PPO | Admitting: Internal Medicine

## 2014-08-10 ENCOUNTER — Encounter: Payer: Self-pay | Admitting: Internal Medicine

## 2014-08-10 ENCOUNTER — Encounter: Payer: Self-pay | Admitting: *Deleted

## 2014-08-10 ENCOUNTER — Ambulatory Visit (INDEPENDENT_AMBULATORY_CARE_PROVIDER_SITE_OTHER): Payer: Medicare PPO | Admitting: Internal Medicine

## 2014-08-10 VITALS — BP 122/90 | HR 81 | Ht 72.0 in | Wt 269.2 lb

## 2014-08-10 DIAGNOSIS — I5022 Chronic systolic (congestive) heart failure: Secondary | ICD-10-CM

## 2014-08-10 DIAGNOSIS — Z95810 Presence of automatic (implantable) cardiac defibrillator: Secondary | ICD-10-CM

## 2014-08-10 DIAGNOSIS — T829XXD Unspecified complication of cardiac and vascular prosthetic device, implant and graft, subsequent encounter: Secondary | ICD-10-CM

## 2014-08-10 DIAGNOSIS — Z01812 Encounter for preprocedural laboratory examination: Secondary | ICD-10-CM

## 2014-08-10 DIAGNOSIS — T82198D Other mechanical complication of other cardiac electronic device, subsequent encounter: Secondary | ICD-10-CM

## 2014-08-10 DIAGNOSIS — I255 Ischemic cardiomyopathy: Secondary | ICD-10-CM

## 2014-08-10 DIAGNOSIS — Z4502 Encounter for adjustment and management of automatic implantable cardiac defibrillator: Secondary | ICD-10-CM

## 2014-08-10 LAB — MDC_IDC_ENUM_SESS_TYPE_INCLINIC
Battery Voltage: 2.62 V
Brady Statistic RV Percent Paced: 0 %
Date Time Interrogation Session: 20151005134918
HIGH POWER IMPEDANCE MEASURED VALUE: 46 Ohm
HIGH POWER IMPEDANCE MEASURED VALUE: 47 Ohm
HIGH POWER IMPEDANCE MEASURED VALUE: 47 Ohm
HIGH POWER IMPEDANCE MEASURED VALUE: 49 Ohm
HIGH POWER IMPEDANCE MEASURED VALUE: 50 Ohm
HIGH POWER IMPEDANCE MEASURED VALUE: 50 Ohm
HIGH POWER IMPEDANCE MEASURED VALUE: 51 Ohm
HIGH POWER IMPEDANCE MEASURED VALUE: 52 Ohm
HIGH POWER IMPEDANCE MEASURED VALUE: 61 Ohm
HIGH POWER IMPEDANCE MEASURED VALUE: 61 Ohm
HIGH POWER IMPEDANCE MEASURED VALUE: 65 Ohm
HIGH POWER IMPEDANCE MEASURED VALUE: 66 Ohm
HIGH POWER IMPEDANCE MEASURED VALUE: 67 Ohm
HighPow Impedance: 46 Ohm
HighPow Impedance: 48 Ohm
HighPow Impedance: 50 Ohm
HighPow Impedance: 51 Ohm
HighPow Impedance: 52 Ohm
HighPow Impedance: 52 Ohm
HighPow Impedance: 55 Ohm
HighPow Impedance: 55 Ohm
HighPow Impedance: 61 Ohm
HighPow Impedance: 61 Ohm
HighPow Impedance: 64 Ohm
HighPow Impedance: 65 Ohm
HighPow Impedance: 65 Ohm
HighPow Impedance: 67 Ohm
HighPow Impedance: 68 Ohm
HighPow Impedance: 72 Ohm
HighPow Impedance: 74 Ohm
HighPow Impedance: 74 Ohm
Lead Channel Impedance Value: 472 Ohm
Lead Channel Impedance Value: 472 Ohm
Lead Channel Impedance Value: 480 Ohm
Lead Channel Impedance Value: 488 Ohm
Lead Channel Impedance Value: 488 Ohm
Lead Channel Impedance Value: 528 Ohm
Lead Channel Pacing Threshold Amplitude: 2.5 V
Lead Channel Sensing Intrinsic Amplitude: 11.5 mV
Lead Channel Sensing Intrinsic Amplitude: 11.5 mV
Lead Channel Sensing Intrinsic Amplitude: 11.5 mV
Lead Channel Sensing Intrinsic Amplitude: 11.5 mV
Lead Channel Sensing Intrinsic Amplitude: 11.6 mV
Lead Channel Sensing Intrinsic Amplitude: 11.7 mV
Lead Channel Sensing Intrinsic Amplitude: 12.9 mV
Lead Channel Sensing Intrinsic Amplitude: 13.6 mV
Lead Channel Sensing Intrinsic Amplitude: 13.9 mV
Lead Channel Sensing Intrinsic Amplitude: 15.1 mV
Lead Channel Setting Pacing Amplitude: 2.5 V
Lead Channel Setting Pacing Pulse Width: 0.4 ms
Lead Channel Setting Sensing Sensitivity: 0.45 mV
MDC IDC MSMT LEADCHNL RV IMPEDANCE VALUE: 456 Ohm
MDC IDC MSMT LEADCHNL RV IMPEDANCE VALUE: 464 Ohm
MDC IDC MSMT LEADCHNL RV IMPEDANCE VALUE: 472 Ohm
MDC IDC MSMT LEADCHNL RV IMPEDANCE VALUE: 472 Ohm
MDC IDC MSMT LEADCHNL RV IMPEDANCE VALUE: 488 Ohm
MDC IDC MSMT LEADCHNL RV IMPEDANCE VALUE: 488 Ohm
MDC IDC MSMT LEADCHNL RV IMPEDANCE VALUE: 512 Ohm
MDC IDC MSMT LEADCHNL RV IMPEDANCE VALUE: 512 Ohm
MDC IDC MSMT LEADCHNL RV IMPEDANCE VALUE: 536 Ohm
MDC IDC MSMT LEADCHNL RV PACING THRESHOLD PULSEWIDTH: 0.1 ms
MDC IDC MSMT LEADCHNL RV SENSING INTR AMPL: 10.9 mV
MDC IDC MSMT LEADCHNL RV SENSING INTR AMPL: 12 mV
MDC IDC MSMT LEADCHNL RV SENSING INTR AMPL: 12.1 mV
MDC IDC MSMT LEADCHNL RV SENSING INTR AMPL: 12.7 mV
MDC IDC MSMT LEADCHNL RV SENSING INTR AMPL: 12.8 mV
MDC IDC SET ZONE DETECTION INTERVAL: 300 ms
Zone Setting Detection Interval: 240 ms
Zone Setting Detection Interval: 270 ms

## 2014-08-10 MED ORDER — SPIRONOLACTONE 25 MG PO TABS: 12.5000 mg | ORAL_TABLET | Freq: Every day | ORAL | Status: DC

## 2014-08-10 NOTE — Progress Notes (Signed)
Patient Care Team: Dione Housekeeper, MD as PCP - General (Family Medicine)   HPI  Stephen [name] is a 53 y.o. male  seen in followup for ICD implanted initially for primary prevention in the setting of ischemic heart disease.He has prior stenting of his RCA.He has had appropriate therapy for VT./VF in  July of 08. The cycle length was 200 ms or so .  His device has reached ERI  He has 6949 lead.   He was 9/15  hospitalized for atypical chest pain. He underwent a Myoview as an outpatient demonstrating no ischemia and ejection fraction of about 25%   this represents a decrease from his echo in 2013 from 45%. Having said that, functionally he is stable if not improved.    He has known sleep apnea but has not been using therapy because of mask intolerance. He is currently disabled and working at home    History of a pulmonary embolism 2012    Past Medical History  Diagnosis Date  . Obesity   . Hypertension   . Hyperlipidemia   . Systolic heart failure   . Coronary artery disease     native vessel  . AICD (automatic cardioverter/defibrillator) present 2006    [place] [address]32  . Ischemic cardiomyopathy     ischemic  . OSA (obstructive sleep apnea)   . [place] Paroxysmal ventricular tachycardia     Rx via ATP 2008  . Pulmonary embolism   . MI, old     Past Surgical History  Procedure Laterality Date  . Cardiac defibrillator placement  2006    [employer]tronic Maximo 7232  . Knee surgery      left     Current Outpatient Prescriptions  Medication Sig Dispense Refill  . amLODipine (NORVASC) 5 MG tablet Take 5 mg by mouth daily.      Marland Kitchen aspirin EC 81 MG EC tablet Take 1 tablet (81 mg total) by mouth daily.      . bisoprolol (ZEBETA) 5 MG tablet Take 5 mg by mouth 2 (two) times daily.      . cholecalciferol (VITAMIN D) 1000 UNITS tablet Take 1,000 Units by mouth daily.        . divalproex (DEPAKOTE ER) 500 MG 24 hr tablet Take 500 mg by mouth at bedtime.       .  enalapril (VASOTEC) 10 MG tablet Take 10 mg by mouth daily.        . folic acid (FOLVITE) 1 MG tablet Take 1 mg by mouth daily.      . furosemide (LASIX) 40 MG tablet Take 40 mg by mouth daily.      Marland Kitchen KLOR-CON M20 20 MEQ tablet Take 20 mEq by mouth daily.      . mometasone (NASONEX) 50 MCG/ACT nasal spray Place 2 sprays into the nose daily as needed. For nasal congestion      . nitroGLYCERIN (NITROSTAT) 0.4 MG SL tablet Place 0.4 mg under the tongue every 5 (five) minutes as needed for chest pain.      Marland Kitchen omeprazole (PRILOSEC) 40 MG capsule Take 40 mg by mouth daily.      . rivaroxaban (XARELTO) 20 MG TABS tablet Take 20 mg by mouth daily with supper.      . rosuvastatin (CRESTOR) 5 MG tablet Take 2.5 mg by mouth every other day.       . tetrahydrozoline-zinc (VISINE-AC) 0.05-0.25 % ophthalmic solution Place 2 drops into both eyes 3 (three)  times daily as needed (redness relief).       No current facility-administered medications for this visit.    Allergies  Allergen Reactions  . Ativan [Lorazepam]     Went crazy  . Haldol [Haloperidol Lactate]     Went crazy     Review of Systems negative except from HPI and PMH  Physical Exam BP 122/90  Pulse 81  Ht 6' (1.829 m)  Wt 269 lb 3.2 oz (122.108 kg)  BMI 36.50 kg/m2 Well developed and well nourished in no acute distress HENT normal E scleral and icterus clear Neck Supple JVP flat; carotids brisk and full Clear to ausculation Regular rate and rhythm, no murmurs gallops or rub Soft with active bowel sounds No clubbing cyanosis 1+ Edema Alert and oriented, grossly normal motor and sensory function Skin Warm and Dry  Electrocardiogram demonstrates sinus rhythm at 89 Interval 17/11/38 Axis III 1 Prior inferior wall MI   Assessment and  Plan  Ischemic heart disease  Implantable defibrillator  6949-lead  Congestive heart failure-chronic systolic  Pulmonary embolism on Rivaroxaban  We have reviewed the benefits and  risks of generator replacement.  These include but are not limited to lead fracture and infection.  The patient understands, agrees and is willing to proceed.    He will see need lead revision for a 6949-lead.  His Myoview scan was nonischemic but didn't think clearly decrease in left ventricular systolic function. He is currently on beta blockers enalapril. We'll anticipate  discontinuation of amlodipine and  the introduction of spironolactone

## 2014-08-10 NOTE — Progress Notes (Signed)
Mailed letter of instructions to patient's verified home address.

## 2014-08-10 NOTE — Patient Instructions (Addendum)
Your physician has recommended you make the following change in your medication:  1) STOP Amlodinpine 2) START Aldactone 12.5 mg daily -- DO NOT START THIS UNTIL 08/28/14.  Your physician has recommended that you have a defibrillator generator change and lead revision. Please see the instruction sheet given to you today for more information - I will mail this to you.  Pre procedure labs on 08/28/14

## 2014-08-20 ENCOUNTER — Encounter (HOSPITAL_COMMUNITY): Payer: Self-pay | Admitting: Pharmacy Technician

## 2014-08-28 ENCOUNTER — Other Ambulatory Visit (INDEPENDENT_AMBULATORY_CARE_PROVIDER_SITE_OTHER): Payer: Medicare PPO | Admitting: *Deleted

## 2014-08-28 DIAGNOSIS — I255 Ischemic cardiomyopathy: Secondary | ICD-10-CM

## 2014-08-28 DIAGNOSIS — Z4502 Encounter for adjustment and management of automatic implantable cardiac defibrillator: Secondary | ICD-10-CM

## 2014-08-28 DIAGNOSIS — Z95810 Presence of automatic (implantable) cardiac defibrillator: Secondary | ICD-10-CM

## 2014-08-28 DIAGNOSIS — Z01812 Encounter for preprocedural laboratory examination: Secondary | ICD-10-CM

## 2014-08-28 LAB — CBC WITH DIFFERENTIAL/PLATELET
BASOS PCT: 0.7 % (ref 0.0–3.0)
Basophils Absolute: 0.1 10*3/uL (ref 0.0–0.1)
EOS ABS: 0.2 10*3/uL (ref 0.0–0.7)
Eosinophils Relative: 2.5 % (ref 0.0–5.0)
HEMATOCRIT: 45.2 % (ref 39.0–52.0)
Hemoglobin: 14.9 g/dL (ref 13.0–17.0)
LYMPHS PCT: 36.7 % (ref 12.0–46.0)
Lymphs Abs: 2.7 10*3/uL (ref 0.7–4.0)
MCHC: 33 g/dL (ref 30.0–36.0)
MCV: 96 fl (ref 78.0–100.0)
Monocytes Absolute: 1.1 10*3/uL — ABNORMAL HIGH (ref 0.1–1.0)
Monocytes Relative: 15.1 % — ABNORMAL HIGH (ref 3.0–12.0)
NEUTROS ABS: 3.3 10*3/uL (ref 1.4–7.7)
Neutrophils Relative %: 45 % (ref 43.0–77.0)
Platelets: 232 10*3/uL (ref 150.0–400.0)
RBC: 4.71 Mil/uL (ref 4.22–5.81)
RDW: 15.6 % — AB (ref 11.5–15.5)
WBC: 7.3 10*3/uL (ref 4.0–10.5)

## 2014-08-28 LAB — BASIC METABOLIC PANEL
BUN: 8 mg/dL (ref 6–23)
CALCIUM: 9.1 mg/dL (ref 8.4–10.5)
CO2: 23 meq/L (ref 19–32)
Chloride: 104 mEq/L (ref 96–112)
Creatinine, Ser: 1.1 mg/dL (ref 0.4–1.5)
GFR: 71.06 mL/min (ref >60.00)
Glucose, Bld: 92 mg/dL (ref 70–99)
Potassium: 3.8 mEq/L (ref 3.5–5.1)
SODIUM: 139 meq/L (ref 135–145)

## 2014-09-01 DIAGNOSIS — Z6836 Body mass index (BMI) 36.0-36.9, adult: Secondary | ICD-10-CM | POA: Diagnosis not present

## 2014-09-01 DIAGNOSIS — Z23 Encounter for immunization: Secondary | ICD-10-CM | POA: Diagnosis not present

## 2014-09-01 DIAGNOSIS — E669 Obesity, unspecified: Secondary | ICD-10-CM | POA: Diagnosis not present

## 2014-09-01 DIAGNOSIS — Z79899 Other long term (current) drug therapy: Secondary | ICD-10-CM | POA: Diagnosis not present

## 2014-09-01 DIAGNOSIS — G4733 Obstructive sleep apnea (adult) (pediatric): Secondary | ICD-10-CM | POA: Diagnosis not present

## 2014-09-01 DIAGNOSIS — I5022 Chronic systolic (congestive) heart failure: Secondary | ICD-10-CM | POA: Diagnosis not present

## 2014-09-01 DIAGNOSIS — Z7982 Long term (current) use of aspirin: Secondary | ICD-10-CM | POA: Diagnosis not present

## 2014-09-01 DIAGNOSIS — Z4502 Encounter for adjustment and management of automatic implantable cardiac defibrillator: Secondary | ICD-10-CM | POA: Diagnosis not present

## 2014-09-01 DIAGNOSIS — E785 Hyperlipidemia, unspecified: Secondary | ICD-10-CM | POA: Diagnosis not present

## 2014-09-01 DIAGNOSIS — Z86711 Personal history of pulmonary embolism: Secondary | ICD-10-CM | POA: Diagnosis not present

## 2014-09-01 DIAGNOSIS — I2510 Atherosclerotic heart disease of native coronary artery without angina pectoris: Secondary | ICD-10-CM | POA: Diagnosis not present

## 2014-09-01 DIAGNOSIS — I472 Ventricular tachycardia: Secondary | ICD-10-CM | POA: Diagnosis not present

## 2014-09-01 DIAGNOSIS — I10 Essential (primary) hypertension: Secondary | ICD-10-CM | POA: Diagnosis not present

## 2014-09-01 DIAGNOSIS — Z7901 Long term (current) use of anticoagulants: Secondary | ICD-10-CM | POA: Diagnosis not present

## 2014-09-01 DIAGNOSIS — I255 Ischemic cardiomyopathy: Secondary | ICD-10-CM | POA: Diagnosis not present

## 2014-09-01 MED ORDER — DEXTROSE 5 % IV SOLN
3.0000 g | INTRAVENOUS | Status: DC
  Filled 2014-09-01: qty 3000

## 2014-09-01 MED ORDER — SODIUM CHLORIDE 0.9 % IV SOLN
INTRAVENOUS | Status: DC
  Administered 2014-09-02: 10:00:00 via INTRAVENOUS

## 2014-09-01 MED ORDER — SODIUM CHLORIDE 0.9 % IR SOLN
80.0000 mg | Status: DC
  Filled 2014-09-01: qty 2

## 2014-09-02 ENCOUNTER — Encounter (HOSPITAL_COMMUNITY)
Admission: RE | Disposition: A | Discharge status: Home / Self Care | Payer: Self-pay | Source: Ambulatory Visit | Attending: Internal Medicine

## 2014-09-02 ENCOUNTER — Ambulatory Visit (HOSPITAL_COMMUNITY)
Admission: RE | Admit: 2014-09-02 | Discharge: 2014-09-03 | Disposition: A | Discharge status: Home / Self Care | Payer: Medicare PPO | Source: Ambulatory Visit | Attending: Internal Medicine | Admitting: Internal Medicine

## 2014-09-02 DIAGNOSIS — Z6836 Body mass index (BMI) 36.0-36.9, adult: Secondary | ICD-10-CM | POA: Insufficient documentation

## 2014-09-02 DIAGNOSIS — Z01812 Encounter for preprocedural laboratory examination: Secondary | ICD-10-CM

## 2014-09-02 DIAGNOSIS — Z79899 Other long term (current) drug therapy: Secondary | ICD-10-CM | POA: Insufficient documentation

## 2014-09-02 DIAGNOSIS — Z7901 Long term (current) use of anticoagulants: Secondary | ICD-10-CM | POA: Insufficient documentation

## 2014-09-02 DIAGNOSIS — I5022 Chronic systolic (congestive) heart failure: Secondary | ICD-10-CM

## 2014-09-02 DIAGNOSIS — G4733 Obstructive sleep apnea (adult) (pediatric): Secondary | ICD-10-CM | POA: Insufficient documentation

## 2014-09-02 DIAGNOSIS — E669 Obesity, unspecified: Secondary | ICD-10-CM | POA: Insufficient documentation

## 2014-09-02 DIAGNOSIS — Z23 Encounter for immunization: Secondary | ICD-10-CM | POA: Insufficient documentation

## 2014-09-02 DIAGNOSIS — Z7982 Long term (current) use of aspirin: Secondary | ICD-10-CM | POA: Insufficient documentation

## 2014-09-02 DIAGNOSIS — Z4502 Encounter for adjustment and management of automatic implantable cardiac defibrillator: Secondary | ICD-10-CM | POA: Insufficient documentation

## 2014-09-02 DIAGNOSIS — Z45010 Encounter for checking and testing of cardiac pacemaker pulse generator [battery]: Secondary | ICD-10-CM

## 2014-09-02 DIAGNOSIS — I472 Ventricular tachycardia: Secondary | ICD-10-CM | POA: Insufficient documentation

## 2014-09-02 DIAGNOSIS — I2510 Atherosclerotic heart disease of native coronary artery without angina pectoris: Secondary | ICD-10-CM | POA: Insufficient documentation

## 2014-09-02 DIAGNOSIS — Z86711 Personal history of pulmonary embolism: Secondary | ICD-10-CM | POA: Insufficient documentation

## 2014-09-02 DIAGNOSIS — E785 Hyperlipidemia, unspecified: Secondary | ICD-10-CM | POA: Insufficient documentation

## 2014-09-02 DIAGNOSIS — I255 Ischemic cardiomyopathy: Secondary | ICD-10-CM | POA: Insufficient documentation

## 2014-09-02 DIAGNOSIS — Z95810 Presence of automatic (implantable) cardiac defibrillator: Secondary | ICD-10-CM

## 2014-09-02 DIAGNOSIS — I10 Essential (primary) hypertension: Secondary | ICD-10-CM | POA: Insufficient documentation

## 2014-09-02 HISTORY — PX: IMPLANTABLE CARDIOVERTER DEFIBRILLATOR GENERATOR CHANGE: SHX5474

## 2014-09-02 HISTORY — PX: LEAD REVISION: SHX5945

## 2014-09-02 LAB — SURGICAL PCR SCREEN
MRSA, PCR: NEGATIVE
Staphylococcus aureus: NEGATIVE

## 2014-09-02 SURGERY — IMPLANTABLE CARDIOVERTER DEFIBRILLATOR GENERATOR CHANGE
Anesthesia: LOCAL

## 2014-09-02 MED ORDER — MIDAZOLAM HCL 5 MG/5ML IJ SOLN
INTRAMUSCULAR | Status: AC
  Filled 2014-09-02: qty 5

## 2014-09-02 MED ORDER — DIVALPROEX SODIUM ER 500 MG PO TB24
500.0000 mg | ORAL_TABLET | Freq: Every day | ORAL | Status: DC
  Administered 2014-09-02: 500 mg via ORAL
  Filled 2014-09-02 (×2): qty 1

## 2014-09-02 MED ORDER — BISOPROLOL FUMARATE 5 MG PO TABS
5.0000 mg | ORAL_TABLET | Freq: Two times a day (BID) | ORAL | Status: DC
  Administered 2014-09-02 – 2014-09-03 (×2): 5 mg via ORAL
  Filled 2014-09-02 (×2): qty 1

## 2014-09-02 MED ORDER — OXYCODONE HCL 5 MG PO TABS
5.0000 mg | ORAL_TABLET | Freq: Four times a day (QID) | ORAL | Status: DC | PRN
  Administered 2014-09-02 – 2014-09-03 (×2): 5 mg via ORAL
  Filled 2014-09-02 (×2): qty 1

## 2014-09-02 MED ORDER — ACETAMINOPHEN 325 MG PO TABS
325.0000 mg | ORAL_TABLET | ORAL | Status: DC | PRN
  Administered 2014-09-02: 650 mg via ORAL
  Filled 2014-09-02: qty 2

## 2014-09-02 MED ORDER — FENTANYL CITRATE 0.05 MG/ML IJ SOLN
INTRAMUSCULAR | Status: AC
  Filled 2014-09-02: qty 2

## 2014-09-02 MED ORDER — SODIUM CHLORIDE 0.9 % IV SOLN
INTRAVENOUS | Status: AC
  Administered 2014-09-02: 13:00:00 via INTRAVENOUS

## 2014-09-02 MED ORDER — SPIRONOLACTONE 25 MG PO TABS
12.5000 mg | ORAL_TABLET | Freq: Every day | ORAL | Status: DC
  Administered 2014-09-02 – 2014-09-03 (×2): 12.5 mg via ORAL
  Filled 2014-09-02 (×2): qty 1

## 2014-09-02 MED ORDER — LIDOCAINE HCL (PF) 1 % IJ SOLN
INTRAMUSCULAR | Status: AC
  Filled 2014-09-02: qty 30

## 2014-09-02 MED ORDER — MUPIROCIN 2 % EX OINT
TOPICAL_OINTMENT | CUTANEOUS | Status: AC
  Administered 2014-09-02: 1 via TOPICAL
  Filled 2014-09-02: qty 22

## 2014-09-02 MED ORDER — RIVAROXABAN 20 MG PO TABS
20.0000 mg | ORAL_TABLET | Freq: Every day | ORAL | Status: DC
  Administered 2014-09-02: 20 mg via ORAL
  Filled 2014-09-02: qty 1

## 2014-09-02 MED ORDER — ROSUVASTATIN CALCIUM 5 MG PO TABS
2.5000 mg | ORAL_TABLET | ORAL | Status: DC
  Administered 2014-09-02: 2.5 mg via ORAL
  Filled 2014-09-02: qty 0.5

## 2014-09-02 MED ORDER — ENALAPRIL MALEATE 5 MG PO TABS
10.0000 mg | ORAL_TABLET | Freq: Every day | ORAL | Status: DC
  Administered 2014-09-02 – 2014-09-03 (×2): 10 mg via ORAL
  Filled 2014-09-02 (×2): qty 2

## 2014-09-02 MED ORDER — ASPIRIN EC 81 MG PO TBEC
81.0000 mg | DELAYED_RELEASE_TABLET | Freq: Every day | ORAL | Status: DC
  Administered 2014-09-02 – 2014-09-03 (×2): 81 mg via ORAL
  Filled 2014-09-02 (×2): qty 1

## 2014-09-02 MED ORDER — VITAMIN D 1000 UNITS PO TABS
1000.0000 [IU] | ORAL_TABLET | Freq: Every day | ORAL | Status: DC
  Administered 2014-09-02 – 2014-09-03 (×2): 1000 [IU] via ORAL
  Filled 2014-09-02 (×2): qty 1

## 2014-09-02 MED ORDER — POTASSIUM CHLORIDE CRYS ER 20 MEQ PO TBCR
20.0000 meq | EXTENDED_RELEASE_TABLET | Freq: Every day | ORAL | Status: DC
  Administered 2014-09-02 – 2014-09-03 (×2): 20 meq via ORAL
  Filled 2014-09-02 (×2): qty 1

## 2014-09-02 MED ORDER — HEPARIN (PORCINE) IN NACL 2-0.9 UNIT/ML-% IJ SOLN
INTRAMUSCULAR | Status: AC
  Filled 2014-09-02: qty 500

## 2014-09-02 MED ORDER — NITROGLYCERIN 0.4 MG SL SUBL: 0.4000 mg | SUBLINGUAL_TABLET | SUBLINGUAL | Status: DC | PRN

## 2014-09-02 MED ORDER — CEFAZOLIN SODIUM 1-5 GM-% IV SOLN
1.0000 g | Freq: Four times a day (QID) | INTRAVENOUS | Status: AC
  Administered 2014-09-02 – 2014-09-03 (×3): 1 g via INTRAVENOUS
  Filled 2014-09-02 (×4): qty 50

## 2014-09-02 MED ORDER — PNEUMOCOCCAL VAC POLYVALENT 25 MCG/0.5ML IJ INJ
0.5000 mL | INJECTION | INTRAMUSCULAR | Status: AC
  Administered 2014-09-03: 0.5 mL via INTRAMUSCULAR
  Filled 2014-09-02: qty 0.5

## 2014-09-02 MED ORDER — INFLUENZA VAC SPLIT QUAD 0.5 ML IM SUSY
0.5000 mL | PREFILLED_SYRINGE | INTRAMUSCULAR | Status: AC
  Administered 2014-09-03: 0.5 mL via INTRAMUSCULAR
  Filled 2014-09-02: qty 0.5

## 2014-09-02 MED ORDER — AMLODIPINE BESYLATE 5 MG PO TABS
5.0000 mg | ORAL_TABLET | Freq: Every day | ORAL | Status: DC
  Administered 2014-09-02: 5 mg via ORAL
  Filled 2014-09-02 (×2): qty 1

## 2014-09-02 MED ORDER — ONDANSETRON HCL 4 MG/2ML IJ SOLN: 4.0000 mg | Freq: Four times a day (QID) | INTRAMUSCULAR | Status: DC | PRN

## 2014-09-02 MED ORDER — CHLORHEXIDINE GLUCONATE 4 % EX LIQD
60.0000 mL | Freq: Once | CUTANEOUS | Status: DC
  Filled 2014-09-02: qty 60

## 2014-09-02 MED ORDER — FUROSEMIDE 40 MG PO TABS
40.0000 mg | ORAL_TABLET | Freq: Every day | ORAL | Status: DC
  Administered 2014-09-02 – 2014-09-03 (×2): 40 mg via ORAL
  Filled 2014-09-02 (×2): qty 1

## 2014-09-02 MED ORDER — MUPIROCIN 2 % EX OINT
1.0000 "application " | TOPICAL_OINTMENT | Freq: Once | CUTANEOUS | Status: AC
  Administered 2014-09-02: 1 via TOPICAL
  Filled 2014-09-02: qty 22

## 2014-09-02 MED ORDER — FOLIC ACID 1 MG PO TABS
1.0000 mg | ORAL_TABLET | Freq: Every day | ORAL | Status: DC
  Administered 2014-09-02 – 2014-09-03 (×2): 1 mg via ORAL
  Filled 2014-09-02 (×2): qty 1

## 2014-09-02 NOTE — H&P (View-Only) (Signed)
Patient Care Team: Dione Housekeeper, MD as PCP - General (Family Medicine)   HPI  Stephen [name] is a 53 y.o. male  seen in followup for ICD implanted initially for primary prevention in the setting of ischemic heart disease.He has prior stenting of his RCA.He has had appropriate therapy for VT./VF in  July of 08. The cycle length was 200 ms or so .  His device has reached ERI  He has 6949 lead.   He was 9/15  hospitalized for atypical chest pain. He underwent a Myoview as an outpatient demonstrating no ischemia and ejection fraction of about 25%   this represents a decrease from his echo in 2013 from 45%. Having said that, functionally he is stable if not improved.    He has known sleep apnea but has not been using therapy because of mask intolerance. He is currently disabled and working at home    History of a pulmonary embolism 2012    Past Medical History  Diagnosis Date  . Obesity   . Hypertension   . Hyperlipidemia   . Systolic heart failure   . Coronary artery disease     native vessel  . AICD (automatic cardioverter/defibrillator) present 2006    [place] [address]  . Ischemic cardiomyopathy     ischemic  . OSA (obstructive sleep apnea)   . [place] Paroxysmal ventricular tachycardia     Rx via ATP 2008  . Pulmonary embolism   . MI, old     Past Surgical History  Procedure Laterality Date  . Cardiac defibrillator placement  2006    [employer]tr[employer] Maximo 7232  . Knee surgery      left     Current Outpatient Prescriptions  Medication Sig Dispense Refill  . amLODipine (NORVASC) 5 MG tablet Take 5 mg by mouth daily.      Marland Kitchen aspirin EC 81 MG EC tablet Take 1 tablet (81 mg total) by mouth daily.      . bisoprolol (ZEBETA) 5 MG tablet Take 5 mg by mouth 2 (two) times daily.      . cholecalciferol (VITAMIN D) 1000 UNITS tablet Take 1,000 Units by mouth daily.        . divalproex (DEPAKOTE ER) 500 MG 24 hr tablet Take 500 mg by mouth at bedtime.       .  enalapril (VASOTEC) 10 MG tablet Take 10 mg by mouth daily.        . folic acid (FOLVITE) 1 MG tablet Take 1 mg by mouth daily.      . furosemide (LASIX) 40 MG tablet Take 40 mg by mouth daily.      Marland Kitchen KLOR-CON M20 20 MEQ tablet Take 20 mEq by mouth daily.      . mometasone (NASONEX) 50 MCG/ACT nasal spray Place 2 sprays into the nose daily as needed. For nasal congestion      . nitroGLYCERIN (NITROSTAT) 0.4 MG SL tablet Place 0.4 mg under the tongue every 5 (five) minutes as needed for chest pain.      Marland Kitchen omeprazole (PRILOSEC) 40 MG capsule Take 40 mg by mouth daily.      . rivaroxaban (XARELTO) 20 MG TABS tablet Take 20 mg by mouth daily with supper.      . rosuvastatin (CRESTOR) 5 MG tablet Take 2.5 mg by mouth every other day.       . tetrahydrozoline-zinc (VISINE-AC) 0.05-0.25 % ophthalmic solution Place 2 drops into both eyes 3 (three)  times daily as needed (redness relief).       No current facility-administered medications for this visit.    Allergies  Allergen Reactions  . Ativan [Lorazepam]     Went crazy  . Haldol [Haloperidol Lactate]     Went crazy     Review of Systems negative except from HPI and PMH  Physical Exam BP 122/90  Pulse 81  Ht 6' (1.829 m)  Wt 269 lb 3.2 oz (122.108 kg)  BMI 36.50 kg/m2 Well developed and well nourished in no acute distress HENT normal E scleral and icterus clear Neck Supple JVP flat; carotids brisk and full Clear to ausculation Regular rate and rhythm, no murmurs gallops or rub Soft with active bowel sounds No clubbing cyanosis 1+ Edema Alert and oriented, grossly normal motor and sensory function Skin Warm and Dry  Electrocardiogram demonstrates sinus rhythm at 89 Interval 17/11/38 Axis III 1 Prior inferior wall MI   Assessment and  Plan  Ischemic heart disease  Implantable defibrillator  6949-lead  Congestive heart failure-chronic systolic  Pulmonary embolism on Rivaroxaban  We have reviewed the benefits and  risks of generator replacement.  These include but are not limited to lead fracture and infection.  The patient understands, agrees and is willing to proceed.    He will see need lead revision for a 6949-lead.  His Myoview scan was nonischemic but didn't think clearly decrease in left ventricular systolic function. He is currently on beta blockers enalapril. We'll anticipate  discontinuation of amlodipine and  the introduction of spironolactone

## 2014-09-02 NOTE — Interval H&P Note (Signed)
ICD Criteria  Current LVEF:20% ;Obtained > or = 1 month ago and < or = 3 months ago.  NYHA Functional Classification: Class II  Heart Failure History:  Yes, Duration of heart failure since onset is > 9 months  Non-Ischemic Dilated Cardiomyopathy History:  No.  Atrial Fibrillation/Atrial Flutter:  No.  Ventricular Tachycardia History:  Yes, Hemodynamic instability present, VT Type:  SVT - Monomorphic.  Cardiac Arrest History:  No  History of Syndromes with Risk of Sudden Death:  No.  Previous ICD:  Yes, ICD Type:  Single, Reason for ICD:  Primary prevention.  LVEF is not available  Electrophysiology Study: No.  Prior MI: Yes, Most recent MI timeframe is > 40 days.  PPM: No.  OSA:  Yes  Patient Life Expectancy of >=1 year: Yes.  Anticoagulation Therapy:  Patient is NOT on anticoagulation therapy.   Beta Blocker Therapy:  Yes.   Ace Inhibitor/ARB Therapy:  Yes.History and Physical Interval Note:  09/02/2014 8:23 AM  Stephen [name]  has presented today for surgery, with the diagnosis of ERI  The various methods of treatment have been discussed with the patient and family. After consideration of risks, benefits and other options for treatment, the patient has consented to  Procedure(s): IMPLANTABLE CARDIOVERTER DEFIBRILLATOR GENERATOR CHANGE (N/A) LEAD REVISION (N/A) as a surgical intervention .  The patient's history has been reviewed, patient examined, no change in status, stable for surgery.  I have reviewed the patient's chart and labs.  Questions were answered to the patient's satisfaction.     Virl Axe

## 2014-09-02 NOTE — CV Procedure (Addendum)
Preoperative diagnosis ischemic cardiomyopathy  Device at [facility] with advisory recall for ICD lead Postoperative diagnosis same/   Procedure: Generator removal; Assessment of HV leads   Contrast venogram,  Insertion of new ICD system;   Following informed consent the patient was brought to the electrophysiology laboratory in place of the fluoroscopic table in the supine position after routine prep and drape lidocaine was infiltrated in the region of the previous incision and carried down to later the device pocket using sharp dissection and electrocautery. The pocket was opened the device was freed up and was explanted.    Venography had demonstrated patency of the extrathoracic vein and venipuncture was successfully accomplished without aspiration of air or puncture artery  Medtronic 6935M< lead was manipulated to RV apex R wave 9.6 with impedance 764 threshold 0.7@0 .5  Current at threshold 0.9 ma current of injury brisk and no diaphragmatic pacing at 10V    The leads were then attached to a Medtronic EVERA  pulse generator, serial number  [id number]  The previous lead was capped and secured behind the device   Through the device the   ICD ventricular lead demonstrated an R wave of  9.4  millivolts., and impedance of 608 ohms, and a pacing threshold of  0.5 volts at 0.4 msec and the    High voltage impedances were 92 ohms  The device was secured to the pectoral fascia  The pocket was irrigated with antibiotic containing saline solution hemostasis was assured and the leads and the device were placed in the pocket. The wound was then closed in 3 layers in normal fashion.dERMABOND WAS APPLIED  The patient tolerated the procedure without apparent complication.  DFT testing was not performed  EBL Minimal   Virl Axe   \

## 2014-09-03 ENCOUNTER — Ambulatory Visit (HOSPITAL_COMMUNITY): Payer: Medicare PPO

## 2014-09-03 ENCOUNTER — Encounter (HOSPITAL_COMMUNITY): Payer: Self-pay | Admitting: *Deleted

## 2014-09-03 DIAGNOSIS — I5022 Chronic systolic (congestive) heart failure: Secondary | ICD-10-CM | POA: Diagnosis not present

## 2014-09-03 DIAGNOSIS — I255 Ischemic cardiomyopathy: Secondary | ICD-10-CM

## 2014-09-03 DIAGNOSIS — I472 Ventricular tachycardia: Secondary | ICD-10-CM | POA: Diagnosis not present

## 2014-09-03 DIAGNOSIS — Z4502 Encounter for adjustment and management of automatic implantable cardiac defibrillator: Secondary | ICD-10-CM | POA: Diagnosis not present

## 2014-09-03 DIAGNOSIS — Z23 Encounter for immunization: Secondary | ICD-10-CM | POA: Diagnosis not present

## 2014-09-03 MED ORDER — SPIRONOLACTONE 25 MG PO TABS: 25.0000 mg | ORAL_TABLET | Freq: Every day | ORAL | Status: DC

## 2014-09-03 NOTE — Discharge Summary (Signed)
ELECTROPHYSIOLOGY PROCEDURE DISCHARGE SUMMARY    Patient ID: Stephen [name],  MRN: [id number], DOB/AGE: Nov 18, 1960 53 y.o.  Admit date: 09/02/2014 Discharge date: 09/03/2014  Primary Care Physician: [name], MD Electrophysiologist: Caryl Comes  Primary Discharge Diagnosis:  Ischemic cardiomyopathy with previously implanted ICD at [facility] and previously implanted RV lead on manufacturer recall s/p ICD gen change and RV lead revision this admission  Secondary Discharge Diagnosis:  1.  CAD 2.  VT s/p appropriate device therapy 3.  Hypertension 4.  Hyperlipidemia 5.  Sleep apnea 6.  Systolic heart failure  Allergies  Allergen Reactions  . Ativan [Lorazepam]     Went crazy  . Haldol [Haloperidol Lactate]     Went crazy      Procedures This Admission:  1.  ICD generator change and insertion of new RV lead on 09-02-14 by Dr Caryl Comes.  The patient received a MDT model number Evera ICD with model number 0600K right ventricular lead.  The previously implanted 6949 lead was capped.  DFT testing was deferred.  There were no immediate post procedure complications. 2.  CXR on 09-03-14 demonstrated no pneumothorax status post device implantation.   Brief HPI: JUANMANUEL [name] is a 52 y.o. male with a past medical history as outlined above.  His previously implanted ICD had reached elective replacement indicator.  Previously implanted 6949 RV lead is on manufacturer recall.  Risks, benefits, and alternatives to ICD gen change and insertion of new RV lead were reviewed with the patient who wished to proceed.   Hospital Course:  The patient was admitted and underwent implantation of a MDT Evera ICD with details as outlined above.   He was monitored on telemetry overnight which demonstrated sinus rhythm with intermittent junctional rhythm.  Left chest was without hematoma or ecchymosis.  The device was interrogated and found to be functioning normally.  CXR was obtained and demonstrated  no pneumothorax status post device implantation.  Wound care, arm mobility, and restrictions were reviewed with the patient.  Dr Caryl Comes examined the patient and considered them stable for discharge to home.   The patient's discharge medications include an ACE-I (Enalapril) and beta blocker (Bisoprolol).   Discharge Vitals: Blood pressure 138/86, pulse 68, temperature 97.8 F (36.6 C), temperature source Oral, resp. rate 18, height 6' (1.829 m), weight 264 lb 1.6 oz (119.795 kg), SpO2 94.00%.  Well developed and nourished in no acute distress HENT normal Neck supple with JVP-flat Clear Regular rate and rhythm, 2/6 murmur  Abd-soft with active BS No Clubbing cyanosis edema Skin-warm and dry A & Oriented  Grossly normal sensory and motor function Pocket without hematoma Labs:   Lab Results  Component Value Date   WBC 7.3 08/28/2014   HGB 14.9 08/28/2014   HCT 45.2 08/28/2014   MCV 96.0 08/28/2014   PLT 232.0 08/28/2014    Recent Labs Lab 08/28/14 1138  NA 139  K 3.8  CL 104  CO2 23  BUN 8  CREATININE 1.1  CALCIUM 9.1  GLUCOSE 92   CXR  godd lead placement  ICD interrogation normal function  Discharge Medications:    Medication List    ASK your doctor about these medications       amLODipine 5 MG tablet  Commonly known as:  NORVASC  Take 5 mg by mouth daily.     aspirin 81 MG EC tablet  Take 81 mg by mouth daily.     bisoprolol 5 MG tablet  Commonly known as:  ZEBETA  Take 5 mg by mouth 2 (two) times daily.     cholecalciferol 1000 UNITS tablet  Commonly known as:  VITAMIN D  Take 1,000 Units by mouth daily.     divalproex 500 MG 24 hr tablet  Commonly known as:  DEPAKOTE ER  Take 500 mg by mouth at bedtime.     enalapril 10 MG tablet  Commonly known as:  VASOTEC  Take 10 mg by mouth daily.     folic acid 1 MG tablet  Commonly known as:  FOLVITE  Take 1 mg by mouth daily.     furosemide 40 MG tablet  Commonly known as:  LASIX  Take 40 mg by  mouth daily.     KLOR-CON M20 20 MEQ tablet  Generic drug:  potassium chloride SA  Take 20 mEq by mouth daily.     mometasone 50 MCG/ACT nasal spray  Commonly known as:  NASONEX  Place 2 sprays into the nose daily as needed. For nasal congestion     nitroGLYCERIN 0.4 MG SL tablet  Commonly known as:  NITROSTAT  Place 0.4 mg under the tongue every 5 (five) minutes as needed for chest pain.     omeprazole 40 MG capsule  Commonly known as:  PRILOSEC  Take 40 mg by mouth daily.     rivaroxaban 20 MG Tabs tablet  Commonly known as:  XARELTO  Take 20 mg by mouth daily with supper.     rosuvastatin 5 MG tablet  Commonly known as:  CRESTOR  Take 2.5 mg by mouth every other day.     spironolactone 25 MG tablet  Commonly known as:  ALDACTONE  Take 12.5 mg by mouth daily.     tetrahydrozoline-zinc 0.05-0.25 % ophthalmic solution  Commonly known as:  VISINE-AC  Place 2 drops into both eyes 3 (three) times daily as needed (redness relief).        Disposition: home    Duration of Discharge Encounter: Greater than 30 minutes including physician time.  Signed,

## 2014-09-03 NOTE — Progress Notes (Signed)
Pt discharged home with wife.  Reviewed discharge instructions and education, all questions answered.  Assessment unchanged from earlier.  

## 2014-09-03 NOTE — Discharge Instructions (Signed)
Cardioverter Defibrillator Implantation, Care After  Refer to this sheet in the next few weeks. These instructions provide you with information on caring for yourself after your procedure. Your health care provider may also give you more specific instructions. Your treatment has been planned according to current medical practices, but problems sometimes occur. Call your health care provider if you have any problems or questions after your procedure.   WHAT TO EXPECT AFTER THE PROCEDURE  · You may feel pain. Some pain is normal. It may last a few days.  · A slight bump may be seen over the skin where the device was placed. Sometimes, it is possible to feel the device under the skin. This is normal.  · In the months and years afterward, your health care provider will check the device, the leads, and the battery every few months. Eventually, when the battery is low, the device will be replaced.  HOME CARE INSTRUCTIONS   Medicines  · Take medicines only as directed by your health care provider.  · If you were prescribed an antibiotic medicine, finish it all even if you start to feel better.    · Do not take any other medicines without asking your health care provider first. Some medicines, including certain painkillers, can cause bleeding after surgery.    Wound Care   · Do not remove the bandage on your chest until directed to do so by your health care provider.  · Once your bandage is removed, you may see pieces of tape called skin adhesive strips over the area where the cut was made (incision site). Let them fall off on their own.    · Check the incision site every day to make sure it is not infected, bleeding, or starting to pull apart.  · Do not use lotions or ointments near the incision site unless directed to do so.    · Keep the incision area clean and dry for 2-3 days after the procedure or as directed by your health care provider. It takes several weeks for the incision site to completely heal.    · Do not  take baths, swim, or use a hot tub until your health care provider approves.  Activities   · Try to walk a little every day. Exercising is important after this procedure. It is also important to use your shoulder on the side of the pacemaker in daily tasks that do not require exaggerated motion.  · Avoid sudden jerking, pulling, or chopping movements that pull your upper arm far away from your body for at least 6 weeks.  · Do not lift your upper arm above your shoulders for at least 6 weeks. This means no tennis, golf, or swimming for this period of time. If you sleep with the arm above your head, use a restraint to prevent this from happening as you sleep.  · You may go back to work when your health care provider says it is okay. Check with your health care provider before you start to drive or play sports.    Other Instructions   · Follow diet instructions if they were provided. You should be able to eat what you usually do right away, but you may need to limit your salt intake.    · Weigh yourself every day. If you suddenly gain weight, fluid may be building up in your body.    · Always carry your pacemaker identification card with you. The card should list the implant date, device model, and manufacturer. Consider wearing a medical alert bracelet or necklace.  ·   Tell all health care providers that you have a pacemaker. This may prevent them from giving you a magnetic resonance imaging (MRI) scan because of the strong magnets used during that test.  · If you must pass through a metal detector, quickly walk through it. Do not stop under the detector or stand near it.  · Avoid places or objects with a strong electric or magnetic field, including:    ¨ Airport security gates. When at the airport, let officials know you have a pacemaker. Your ID card will let you be checked in a way that is safe for you and that will not damage your pacemaker. Also, do not let a security person wave a magnetic wand near your  pacemaker. That can make it stop working.  ¨ Power plants.    ¨ Large electrical generators.    ¨ Radiofrequency transmission towers, such as cell phone and radio towers.    · Do not use amateur (ham) radio equipment or electric (arc) welding torches. Some devices are safe to use if held at least 1 foot from your pacemaker. These include power tools, lawn mowers, and speakers. If you are unsure of whether something is safe to use, ask your health care provider.    · You may safely use electric blankets, heating pads, computers, and microwave ovens.    · When using your cell phone, hold it to the ear opposite the pacemaker. Do not leave your cell phone in a pocket over the pacemaker.    · Keep all follow-up visits as directed by your health care provider. This is how your health care provider makes sure your chest is healing the way it should. Ask your health care provider when you should come back to have your stitches or staples taken out.    · Have your pacemaker checked every 3-6 months or as directed by your health care provider. Most pacemakers last for 4-8 years before a new one is needed.  SEEK MEDICAL CARE IF:   · You feel one shock in your chest.  · You gain weight suddenly.    · Your legs or feet swell more than they have before.    · It feels like your heart is fluttering or skipping beats (heart palpitations).  · You have a fever.  SEEK IMMEDIATE MEDICAL CARE IF:   · You have chest pain.   · You feel more than one shock.  · You feel more short of breath than you have felt before.   · You feel more light-headed than you have felt before.   · You have problems with your incision site, such as swelling or bleeding, or it starts to open up.    · You notice signs of infection around your incision site. Watch for:    ¨ Warmth.    ¨ Redness.    ¨ Worsening pain.    ¨ Swelling.    ¨ Fluid leaking from the incision site.    Document Released: 05/12/2005 Document Revised: 03/09/2014 Document Reviewed:  11/13/2012  ExitCare® Patient Information ©2015 [employer]. This information is not intended to replace advice given to you by your health care provider. Make sure you discuss any questions you have with your health care provider.

## 2014-09-14 ENCOUNTER — Other Ambulatory Visit (INDEPENDENT_AMBULATORY_CARE_PROVIDER_SITE_OTHER): Payer: Medicare PPO | Admitting: *Deleted

## 2014-09-14 ENCOUNTER — Ambulatory Visit (INDEPENDENT_AMBULATORY_CARE_PROVIDER_SITE_OTHER): Payer: Medicare PPO | Admitting: *Deleted

## 2014-09-14 DIAGNOSIS — I5022 Chronic systolic (congestive) heart failure: Secondary | ICD-10-CM

## 2014-09-14 DIAGNOSIS — I255 Ischemic cardiomyopathy: Secondary | ICD-10-CM

## 2014-09-14 LAB — MDC_IDC_ENUM_SESS_TYPE_INCLINIC
Battery Voltage: 3.14 V
Brady Statistic RV Percent Paced: 0.01 %
Date Time Interrogation Session: 20151109122335
HIGH POWER IMPEDANCE MEASURED VALUE: 190 Ohm
HIGH POWER IMPEDANCE MEASURED VALUE: 66 Ohm
Lead Channel Impedance Value: 399 Ohm
Lead Channel Pacing Threshold Amplitude: 0.75 V
Lead Channel Sensing Intrinsic Amplitude: 13.375 mV
Lead Channel Sensing Intrinsic Amplitude: 13.375 mV
Lead Channel Setting Pacing Pulse Width: 0.4 ms
Lead Channel Setting Sensing Sensitivity: 0.3 mV
MDC IDC MSMT BATTERY REMAINING LONGEVITY: 138 mo
MDC IDC MSMT LEADCHNL RV PACING THRESHOLD PULSEWIDTH: 0.4 ms
MDC IDC SET LEADCHNL RV PACING AMPLITUDE: 3.5 V
MDC IDC SET ZONE DETECTION INTERVAL: 300 ms
MDC IDC SET ZONE DETECTION INTERVAL: 450 ms
Zone Setting Detection Interval: 270 ms

## 2014-09-14 LAB — BASIC METABOLIC PANEL
BUN: 12 mg/dL (ref 6–23)
CHLORIDE: 102 meq/L (ref 96–112)
CO2: 25 mEq/L (ref 19–32)
Calcium: 8.9 mg/dL (ref 8.4–10.5)
Creatinine, Ser: 1 mg/dL (ref 0.4–1.5)
GFR: 79.87 mL/min (ref >60.00)
Glucose, Bld: 87 mg/dL (ref 70–99)
POTASSIUM: 4.4 meq/L (ref 3.5–5.1)
Sodium: 138 mEq/L (ref 135–145)

## 2014-09-14 NOTE — Progress Notes (Signed)
Wound check appointment.  Wound without redness or edema. Incision edges approximated, wound well healed. Normal device function. Thresholds, sensing, and impedances consistent with implant measurements. Device programmed at 3.5V for extra safety margin until 3 month visit. Histogram distribution appropriate for patient and level of activity. No ventricular arrhythmias noted. Patient educated about wound care, arm mobility, lifting restrictions, shock plan. ROV in 3 months with implanting physician.

## 2014-10-07 ENCOUNTER — Encounter: Payer: Self-pay | Admitting: Internal Medicine

## 2014-10-15 ENCOUNTER — Encounter (HOSPITAL_COMMUNITY): Payer: Self-pay | Admitting: Internal Medicine

## 2014-12-04 ENCOUNTER — Encounter: Payer: Medicare PPO | Admitting: Internal Medicine

## 2014-12-22 ENCOUNTER — Encounter: Payer: Medicare PPO | Admitting: Internal Medicine

## 2015-01-25 ENCOUNTER — Encounter: Payer: Self-pay | Admitting: Internal Medicine

## 2015-01-25 ENCOUNTER — Ambulatory Visit (INDEPENDENT_AMBULATORY_CARE_PROVIDER_SITE_OTHER): Payer: Medicare PPO | Admitting: Internal Medicine

## 2015-01-25 VITALS — BP 116/80 | HR 90 | Ht 72.0 in | Wt 266.0 lb

## 2015-01-25 DIAGNOSIS — I472 Ventricular tachycardia, unspecified: Secondary | ICD-10-CM

## 2015-01-25 DIAGNOSIS — I255 Ischemic cardiomyopathy: Secondary | ICD-10-CM

## 2015-01-25 DIAGNOSIS — I5022 Chronic systolic (congestive) heart failure: Secondary | ICD-10-CM

## 2015-01-25 DIAGNOSIS — Z95810 Presence of automatic (implantable) cardiac defibrillator: Secondary | ICD-10-CM

## 2015-01-25 LAB — BASIC METABOLIC PANEL
BUN: 14 mg/dL (ref 6–23)
CO2: 25 mEq/L (ref 19–32)
CREATININE: 1.16 mg/dL (ref 0.40–1.50)
Calcium: 9.7 mg/dL (ref 8.4–10.5)
Chloride: 100 mEq/L (ref 96–112)
GFR: 69.54 mL/min (ref >60.00)
GLUCOSE: 92 mg/dL (ref 70–99)
Potassium: 3.8 mEq/L (ref 3.5–5.1)
Sodium: 136 mEq/L (ref 135–145)

## 2015-01-25 LAB — MDC_IDC_ENUM_SESS_TYPE_INCLINIC
Battery Remaining Longevity: 136 mo
Battery Voltage: 3.08 V
HighPow Impedance: 228 Ohm
HighPow Impedance: 80 Ohm
Lead Channel Impedance Value: 532 Ohm
Lead Channel Setting Pacing Amplitude: 2 V
Lead Channel Setting Pacing Pulse Width: 0.4 ms
Lead Channel Setting Sensing Sensitivity: 0.3 mV
MDC IDC MSMT LEADCHNL RV PACING THRESHOLD AMPLITUDE: 0.5 V
MDC IDC MSMT LEADCHNL RV PACING THRESHOLD PULSEWIDTH: 0.4 ms
MDC IDC MSMT LEADCHNL RV SENSING INTR AMPL: 13.875 mV
MDC IDC MSMT LEADCHNL RV SENSING INTR AMPL: 15.625 mV
MDC IDC SESS DTM: 20160321123201
MDC IDC SET ZONE DETECTION INTERVAL: 270 ms
MDC IDC SET ZONE DETECTION INTERVAL: 300 ms
MDC IDC STAT BRADY RV PERCENT PACED: 0.01 %
Zone Setting Detection Interval: 450 ms

## 2015-01-25 NOTE — Patient Instructions (Addendum)
Your physician recommends that you continue on your current medications as directed. Please refer to the Current Medication list given to you today.  LABS today: BMET  3 months for repeat LABS...   Remote monitoring is used to monitor your Pacemaker of ICD from home. This monitoring reduces the number of office visits required to check your device to one time per year. It allows [place] to keep an eye on the functioning of your device to ensure it is working properly. You are scheduled for a device check from home on 04/26/2015. You may send your transmission at any time that day. If you have a wireless device, the transmission will be sent automatically. After your physician reviews your transmission, you will receive a postcard with your next transmission date.  Your physician recommends that you schedule a follow-up appointment in: 9 months with Chanetta Marshall, NP

## 2015-01-25 NOTE — Progress Notes (Signed)
Patient Care Team: Dione Housekeeper, MD as PCP - General (Family Medicine)   HPI  Stephen [name] is a 54 y.o. male  seen in followup for ICD implanted initially for primary prevention in the setting of ischemic heart disease.He has prior stenting of his RCA.He has had appropriate therapy for VT./VF in  July of 08. The cycle length was 200 ms or so .  His device reached ERI fall 2015 and he underwent generator replacement as well as a new ICD lead replacing his 6949.   He was 9/15  hospitalized for atypical chest pain. He underwent a Myoview as an outpatient demonstrating no ischemia and ejection fraction of about 25%   this represents a decrease from his echo in 2013 from 45%. Having said that, functionally he is stable if not improved.    He has known sleep apnea but has not been using therapy because of mask intolerance. He is currently disabled and working at home    History of a pulmonary embolism 2012    Past Medical History  Diagnosis Date  . Obesity   . Hypertension   . Hyperlipidemia   . Systolic heart failure   . Coronary artery disease     native vessel  . AICD (automatic cardioverter/defibrillator) present 2006    [place] [address]32  . Ischemic cardiomyopathy     ischemic  . OSA (obstructive sleep apnea)   . [place] Paroxysmal ventricular tachycardia     Rx via [facility] 2008  . Pulmonary embolism   . MI, old     Past Surgical History  Procedure Laterality Date  . Cardiac defibrillator placement  2006    Medtronic Maximo 7232  . Knee surgery      left   . Implantable cardioverter defibrillator generator change N/A 09/02/2014    Procedure: IMPLANTABLE CARDIOVERTER DEFIBRILLATOR GENERATOR CHANGE;  Surgeon: Deboraha Sprang, MD;  Location: [facility] CATH LAB;  Service: Cardiovascular;  Laterality: N/A;  . Lead revision N/A 09/02/2014    Procedure: LEAD REVISION;  Surgeon: Deboraha Sprang, MD;  Location: [facility] CATH LAB;  Service: Cardiovascular;  Laterality: N/A;     Current Outpatient Prescriptions  Medication Sig Dispense Refill  . aspirin 81 MG EC tablet Take 81 mg by mouth daily.    . bisoprolol (ZEBETA) 5 MG tablet Take 5 mg by mouth 2 (two) times daily.    . cholecalciferol (VITAMIN D) 1000 UNITS tablet Take 1,000 Units by mouth daily.     . divalproex (DEPAKOTE ER) 500 MG 24 hr tablet Take 500 mg by mouth at bedtime.     . enalapril (VASOTEC) 10 MG tablet Take 10 mg by mouth daily.     . folic acid (FOLVITE) 1 MG tablet Take 1 mg by mouth daily.    . furosemide (LASIX) 40 MG tablet Take 40 mg by mouth daily.    Marland Kitchen KLOR-CON M20 20 MEQ tablet Take 20 mEq by mouth daily.    . mometasone (NASONEX) 50 MCG/ACT nasal spray Place 2 sprays into the nose daily as needed. For nasal congestion    . nitroGLYCERIN (NITROSTAT) 0.4 MG SL tablet Place 0.4 mg under the tongue every 5 (five) minutes as needed for chest pain.    Marland Kitchen omeprazole (PRILOSEC) 40 MG capsule Take 40 mg by mouth daily.    . rivaroxaban (XARELTO) 20 MG TABS tablet Take 20 mg by mouth daily with supper.    . rosuvastatin (CRESTOR) 5 MG tablet Take  2.5 mg by mouth every other day.     . spironolactone (ALDACTONE) 25 MG tablet Take 1 tablet (25 mg total) by mouth daily. 30 tablet 6  . tetrahydrozoline-zinc (VISINE-AC) 0.05-0.25 % ophthalmic solution Place 2 drops into both eyes 3 (three) times daily as needed (redness relief).     No current facility-administered medications for this visit.    Allergies  Allergen Reactions  . Ativan [Lorazepam]     Went crazy  . Haldol [Haloperidol Lactate]     Went crazy     Review of Systems negative except from HPI and PMH  Physical Exam BP 116/80 mmHg  Pulse 90  Ht 6' (1.829 m)  Wt 266 lb (120.657 kg)  BMI 36.07 kg/m2 Well developed and well nourished in no acute distress HENT normal E scleral and icterus clear Neck Supple JVP flat; carotids brisk and full Clear to ausculation Regular rate and rhythm, no murmurs gallops or rub Soft with  active bowel sounds No clubbing cyanosis 1+ Edema Alert and oriented, grossly normal motor and sensory function Skin Warm and Dry  Electrocardiogram demonstrates sinus rhythm at 89 Interval 17/11/38 Axis III 1 Prior inferior wall MI   Assessment and  Plan  Ischemic heart disease  Implantable defibrillator  Congestive heart failure-chronic systolic  Pulmonary embolism on Rivaroxaban  Cigarette abuse     Device stable  Without symptoms of ischemia  Euvolemic continue current meds  Need followoup labs for aldacatoone now and again q 3 months  Encouraged to stop smoking

## 2015-02-05 ENCOUNTER — Encounter: Payer: Self-pay | Admitting: Internal Medicine

## 2015-02-22 ENCOUNTER — Encounter: Payer: Self-pay | Admitting: Internal Medicine

## 2015-04-26 ENCOUNTER — Encounter: Payer: Medicare PPO | Admitting: *Deleted

## 2015-04-27 ENCOUNTER — Other Ambulatory Visit: Payer: Medicare PPO

## 2015-04-27 ENCOUNTER — Encounter: Payer: Self-pay | Admitting: Cardiology

## 2015-06-11 ENCOUNTER — Telehealth: Payer: Self-pay | Admitting: *Deleted

## 2015-06-11 NOTE — Telephone Encounter (Signed)
Called and informed patient that disability paperwork could not be completed without being seen to discuss/confirm functional status - per Dr. Caryl Comes. He was not happy with having to pay another $40 copay for OV.  He needs this paperwork completed by middle of next week. Scheduled him to see Amber on Monday to go over paperwork.  As I am off, will give Melissa paperwork to give to [employer]. Patient is agreeable to plan and thanks me for helping.

## 2015-06-14 ENCOUNTER — Encounter: Payer: Medicare PPO | Admitting: Nurse Practitioner

## 2015-06-15 ENCOUNTER — Ambulatory Visit (INDEPENDENT_AMBULATORY_CARE_PROVIDER_SITE_OTHER): Payer: Medicare PPO | Admitting: Internal Medicine

## 2015-06-15 ENCOUNTER — Encounter: Payer: Self-pay | Admitting: Internal Medicine

## 2015-06-15 VITALS — BP 110/68 | HR 97 | Ht 72.0 in | Wt 245.4 lb

## 2015-06-15 DIAGNOSIS — I5022 Chronic systolic (congestive) heart failure: Secondary | ICD-10-CM

## 2015-06-15 DIAGNOSIS — Z0271 Encounter for disability determination: Secondary | ICD-10-CM

## 2015-06-15 DIAGNOSIS — I255 Ischemic cardiomyopathy: Secondary | ICD-10-CM | POA: Diagnosis not present

## 2015-06-15 DIAGNOSIS — Z95810 Presence of automatic (implantable) cardiac defibrillator: Secondary | ICD-10-CM

## 2015-06-15 LAB — CUP PACEART INCLINIC DEVICE CHECK
Battery Remaining Longevity: 134 mo
Battery Voltage: 3.04 V
HighPow Impedance: 190 Ohm
HighPow Impedance: 85 Ohm
Lead Channel Impedance Value: 551 Ohm
Lead Channel Pacing Threshold Pulse Width: 0.4 ms
Lead Channel Sensing Intrinsic Amplitude: 16.75 mV
Lead Channel Setting Sensing Sensitivity: 0.3 mV
MDC IDC MSMT LEADCHNL RV PACING THRESHOLD AMPLITUDE: 0.75 V
MDC IDC SESS DTM: 20160809131511
MDC IDC SET LEADCHNL RV PACING AMPLITUDE: 2 V
MDC IDC SET LEADCHNL RV PACING PULSEWIDTH: 0.4 ms
MDC IDC SET ZONE DETECTION INTERVAL: 450 ms
MDC IDC STAT BRADY RV PERCENT PACED: 0.01 %
Zone Setting Detection Interval: 270 ms
Zone Setting Detection Interval: 300 ms

## 2015-06-15 NOTE — Progress Notes (Signed)
The patient comes in today requesting help with disability forms.  The forms were filled out by his report. He says that he is unable to work more than half a day. He says that he has significant strain with interpersonal relationships and stress. He says he is able to sit for half a day.  He denies active chest pain or shortness of breath. There is been no peripheral edema.  BP 110/68 mmHg  Pulse 97  Ht 6' (1.829 m)  Wt 245 lb 6.4 oz (111.313 kg)  BMI 33.28 kg/m2  SpO2 95% Somewhat agitated. Skin was warm and dry. Pulses regular. Device pocket well healed; without hematoma or erythema.  There is no tethering   . ISCHMIEC CARDIOMYOPATHY  icd  No intercurrent Ventricular tachycardia  Without symptoms of ischemia

## 2015-06-15 NOTE — Patient Instructions (Signed)
Medication Instructions:  Your physician recommends that you continue on your current medications as directed. Please refer to the Current Medication list given to you today.  Labwork: None ordered  Testing/Procedures: None ordered  Follow-Up: Remote monitoring is used to monitor your Pacemaker of ICD from home. This monitoring reduces the number of office visits required to check your device to one time per year. It allows [place] to keep an eye on the functioning of your device to ensure it is working properly. You are scheduled for a device check from home on 09/14/15. You may send your transmission at any time that day. If you have a wireless device, the transmission will be sent automatically. After your physician reviews your transmission, you will receive a postcard with your next transmission date.  Your physician wants you to follow-up in: December with Dr. Caryl Comes.  You will receive a reminder letter in the mail two months in advance. If you don't receive a letter, please call our office to schedule the follow-up appointment.   Any Other Special Instructions Will Be Listed Below (If Applicable). Thank you for choosing [facility]!!

## 2015-09-14 ENCOUNTER — Telehealth: Payer: Self-pay | Admitting: Cardiology

## 2015-09-14 ENCOUNTER — Ambulatory Visit (INDEPENDENT_AMBULATORY_CARE_PROVIDER_SITE_OTHER): Payer: Medicare PPO | Admitting: *Deleted

## 2015-09-14 DIAGNOSIS — I255 Ischemic cardiomyopathy: Secondary | ICD-10-CM

## 2015-09-14 NOTE — Telephone Encounter (Signed)
Spoke with pt and reminded pt of remote transmission that is due today. Pt verbalized understanding.   

## 2015-09-14 NOTE — Progress Notes (Signed)
Remote ICD transmission.   

## 2015-09-15 LAB — CUP PACEART REMOTE DEVICE CHECK
Date Time Interrogation Session: 20161108184450
HighPow Impedance: 90 Ohm
Implantable Lead Implant Date: 20151028
Implantable Lead Location: 753860
Lead Channel Pacing Threshold Amplitude: 0.5 V
Lead Channel Pacing Threshold Pulse Width: 0.4 ms
Lead Channel Setting Sensing Sensitivity: 0.3 mV
MDC IDC MSMT BATTERY REMAINING LONGEVITY: 133 mo
MDC IDC MSMT BATTERY VOLTAGE: 3.03 V
MDC IDC MSMT LEADCHNL RV IMPEDANCE VALUE: 532 Ohm
MDC IDC MSMT LEADCHNL RV IMPEDANCE VALUE: 551 Ohm
MDC IDC MSMT LEADCHNL RV SENSING INTR AMPL: 14.5 mV
MDC IDC MSMT LEADCHNL RV SENSING INTR AMPL: 14.5 mV
MDC IDC SET LEADCHNL RV PACING AMPLITUDE: 2 V
MDC IDC SET LEADCHNL RV PACING PULSEWIDTH: 0.4 ms
MDC IDC STAT BRADY RV PERCENT PACED: 0.01 %

## 2015-09-17 ENCOUNTER — Encounter: Payer: Self-pay | Admitting: Cardiology

## 2015-09-20 ENCOUNTER — Telehealth: Payer: Self-pay | Admitting: Cardiology

## 2015-09-20 NOTE — Telephone Encounter (Signed)
Spoke with patient after reviewing transmission. Alert for RV impedence of 100 ohms. Pt feels fine. He will try to arrange transportation to the clinic and call back to schedule appt for alert to be turned off and impedence limit adjusted.

## 2015-09-20 NOTE — Telephone Encounter (Signed)
Pt called and stated that his ICD has been going off every four hours since Friday. Instructed pt to send remote transmission using home monitor. Pt verbalized understanding.

## 2015-09-21 ENCOUNTER — Ambulatory Visit (INDEPENDENT_AMBULATORY_CARE_PROVIDER_SITE_OTHER): Payer: Medicare PPO | Admitting: *Deleted

## 2015-09-21 ENCOUNTER — Encounter: Payer: Self-pay | Admitting: Internal Medicine

## 2015-09-21 VITALS — BP 101/72 | HR 109 | Temp 97.2°F

## 2015-09-21 DIAGNOSIS — I472 Ventricular tachycardia, unspecified: Secondary | ICD-10-CM

## 2015-09-21 DIAGNOSIS — I255 Ischemic cardiomyopathy: Secondary | ICD-10-CM | POA: Diagnosis not present

## 2015-09-21 DIAGNOSIS — Z95810 Presence of automatic (implantable) cardiac defibrillator: Secondary | ICD-10-CM

## 2015-09-21 LAB — CUP PACEART INCLINIC DEVICE CHECK
Battery Remaining Longevity: 133 mo
Brady Statistic RV Percent Paced: 0.01 %
HIGH POWER IMPEDANCE MEASURED VALUE: 98 Ohm
Implantable Lead Location: 753860
Lead Channel Impedance Value: 665 Ohm
Lead Channel Pacing Threshold Amplitude: 0.5 V
Lead Channel Setting Pacing Amplitude: 2 V
Lead Channel Setting Sensing Sensitivity: 0.3 mV
MDC IDC LEAD IMPLANT DT: 20151028
MDC IDC MSMT BATTERY VOLTAGE: 2.97 V
MDC IDC MSMT LEADCHNL RV IMPEDANCE VALUE: 589 Ohm
MDC IDC MSMT LEADCHNL RV PACING THRESHOLD PULSEWIDTH: 0.4 ms
MDC IDC MSMT LEADCHNL RV SENSING INTR AMPL: 16.75 mV
MDC IDC SESS DTM: 20161115121735
MDC IDC SET LEADCHNL RV PACING PULSEWIDTH: 0.4 ms

## 2015-09-21 NOTE — Progress Notes (Signed)
ICD check in clinic for RV defib lead impedance warning on 09/17/15. Pt has had flu-like symptoms for past 2 weeks, including body aches, headache, and sore throat, afebrile today, encouraged fluids, pt has appointment with PCP on Thursday. Normal device function. Thresholds and sensing consistent with previous device measurements. Impedance trends stable over time, RV defib impedance 98 ohms today; max RV defib lead impedance reprogrammed from 100 ohms to 130 ohms. 1 NSVT episode, ~7 beats, avg V 231bpm. Histogram distribution appropriate for patient and level of activity. Device programmed at appropriate safety margins. Device programmed to optimize intrinsic conduction. Estimated longevity 11 years. Pt enrolled in remote follow-up. Patient education completed including shock plan. Alert tones/vibration demonstrated for patient. ROV with AS on 10/27/15 at 1:40pm.

## 2015-09-27 ENCOUNTER — Institutional Professional Consult (permissible substitution) (INDEPENDENT_AMBULATORY_CARE_PROVIDER_SITE_OTHER): Payer: Medicare PPO | Admitting: Internal Medicine

## 2015-09-28 NOTE — Telephone Encounter (Signed)
PT had OV on 09-21-15

## 2015-10-04 ENCOUNTER — Other Ambulatory Visit (HOSPITAL_COMMUNITY): Payer: Self-pay | Admitting: Family Medicine

## 2015-10-04 DIAGNOSIS — R1084 Generalized abdominal pain: Secondary | ICD-10-CM

## 2015-10-05 ENCOUNTER — Other Ambulatory Visit: Payer: Self-pay | Admitting: Nurse Practitioner

## 2015-10-06 ENCOUNTER — Ambulatory Visit (HOSPITAL_COMMUNITY)
Admission: RE | Admit: 2015-10-06 | Discharge: 2015-10-06 | Disposition: A | Discharge status: Home / Self Care | Payer: Medicare PPO | Source: Ambulatory Visit | Attending: Family Medicine | Admitting: Family Medicine

## 2015-10-06 DIAGNOSIS — I2510 Atherosclerotic heart disease of native coronary artery without angina pectoris: Secondary | ICD-10-CM | POA: Insufficient documentation

## 2015-10-06 DIAGNOSIS — K8020 Calculus of gallbladder without cholecystitis without obstruction: Secondary | ICD-10-CM | POA: Insufficient documentation

## 2015-10-06 DIAGNOSIS — R1010 Upper abdominal pain, unspecified: Secondary | ICD-10-CM | POA: Diagnosis not present

## 2015-10-06 DIAGNOSIS — R1084 Generalized abdominal pain: Secondary | ICD-10-CM

## 2015-10-06 MED ORDER — IOHEXOL 350 MG/ML SOLN
85.0000 mL | Freq: Once | INTRAVENOUS | Status: AC | PRN
  Administered 2015-10-06: 85 mL via INTRAVENOUS

## 2015-10-15 ENCOUNTER — Emergency Department (HOSPITAL_COMMUNITY): Payer: Medicare PPO

## 2015-10-15 ENCOUNTER — Encounter (HOSPITAL_COMMUNITY): Payer: Self-pay | Admitting: Emergency Medicine

## 2015-10-15 ENCOUNTER — Emergency Department (HOSPITAL_COMMUNITY)
Admission: EM | Admit: 2015-10-15 | Discharge: 2015-10-15 | Disposition: A | Discharge status: Home / Self Care | Payer: Medicare PPO | Attending: Emergency Medicine | Admitting: Emergency Medicine

## 2015-10-15 DIAGNOSIS — Z86711 Personal history of pulmonary embolism: Secondary | ICD-10-CM | POA: Insufficient documentation

## 2015-10-15 DIAGNOSIS — R05 Cough: Secondary | ICD-10-CM | POA: Diagnosis not present

## 2015-10-15 DIAGNOSIS — Z8669 Personal history of other diseases of the nervous system and sense organs: Secondary | ICD-10-CM | POA: Diagnosis not present

## 2015-10-15 DIAGNOSIS — I255 Ischemic cardiomyopathy: Secondary | ICD-10-CM | POA: Insufficient documentation

## 2015-10-15 DIAGNOSIS — Z79899 Other long term (current) drug therapy: Secondary | ICD-10-CM | POA: Insufficient documentation

## 2015-10-15 DIAGNOSIS — E669 Obesity, unspecified: Secondary | ICD-10-CM | POA: Insufficient documentation

## 2015-10-15 DIAGNOSIS — I252 Old myocardial infarction: Secondary | ICD-10-CM | POA: Insufficient documentation

## 2015-10-15 DIAGNOSIS — I5020 Unspecified systolic (congestive) heart failure: Secondary | ICD-10-CM | POA: Diagnosis not present

## 2015-10-15 DIAGNOSIS — Z95810 Presence of automatic (implantable) cardiac defibrillator: Secondary | ICD-10-CM | POA: Diagnosis not present

## 2015-10-15 DIAGNOSIS — R531 Weakness: Secondary | ICD-10-CM | POA: Diagnosis present

## 2015-10-15 DIAGNOSIS — E785 Hyperlipidemia, unspecified: Secondary | ICD-10-CM | POA: Insufficient documentation

## 2015-10-15 DIAGNOSIS — Z7982 Long term (current) use of aspirin: Secondary | ICD-10-CM | POA: Diagnosis not present

## 2015-10-15 DIAGNOSIS — F17210 Nicotine dependence, cigarettes, uncomplicated: Secondary | ICD-10-CM | POA: Insufficient documentation

## 2015-10-15 DIAGNOSIS — Z7901 Long term (current) use of anticoagulants: Secondary | ICD-10-CM | POA: Insufficient documentation

## 2015-10-15 DIAGNOSIS — I2510 Atherosclerotic heart disease of native coronary artery without angina pectoris: Secondary | ICD-10-CM | POA: Diagnosis not present

## 2015-10-15 DIAGNOSIS — I10 Essential (primary) hypertension: Secondary | ICD-10-CM | POA: Insufficient documentation

## 2015-10-15 DIAGNOSIS — R000 Tachycardia, unspecified: Secondary | ICD-10-CM | POA: Diagnosis not present

## 2015-10-15 LAB — CBC WITH DIFFERENTIAL/PLATELET
Basophils Absolute: 0.1 10*3/uL (ref 0.0–0.1)
Basophils Relative: 1 %
EOS ABS: 0.2 10*3/uL (ref 0.0–0.7)
Eosinophils Relative: 3 %
HEMATOCRIT: 36.2 % — AB (ref 39.0–52.0)
HEMOGLOBIN: 12.6 g/dL — AB (ref 13.0–17.0)
LYMPHS ABS: 2.3 10*3/uL (ref 0.7–4.0)
LYMPHS PCT: 28 %
MCH: 33.1 pg (ref 26.0–34.0)
MCHC: 34.8 g/dL (ref 30.0–36.0)
MCV: 95 fL (ref 78.0–100.0)
MONOS PCT: 14 %
Monocytes Absolute: 1.1 10*3/uL — ABNORMAL HIGH (ref 0.1–1.0)
NEUTROS PCT: 54 %
Neutro Abs: 4.5 10*3/uL (ref 1.7–7.7)
Platelets: 222 10*3/uL (ref 150–400)
RBC: 3.81 MIL/uL — ABNORMAL LOW (ref 4.22–5.81)
RDW: 13.3 % (ref 11.5–15.5)
WBC: 8.3 10*3/uL (ref 4.0–10.5)

## 2015-10-15 LAB — BASIC METABOLIC PANEL
Anion gap: 13 (ref 5–15)
BUN: 8 mg/dL (ref 6–20)
CHLORIDE: 96 mmol/L — AB (ref 101–111)
CO2: 25 mmol/L (ref 22–32)
CREATININE: 0.73 mg/dL (ref 0.61–1.24)
Calcium: 8.7 mg/dL — ABNORMAL LOW (ref 8.9–10.3)
GFR calc Af Amer: >60 mL/min (ref >60)
GFR calc non Af Amer: >60 mL/min (ref >60)
GLUCOSE: 104 mg/dL — AB (ref 65–99)
Potassium: 3.3 mmol/L — ABNORMAL LOW (ref 3.5–5.1)
Sodium: 134 mmol/L — ABNORMAL LOW (ref 135–145)

## 2015-10-15 LAB — URINALYSIS, ROUTINE W REFLEX MICROSCOPIC
BILIRUBIN URINE: NEGATIVE
Glucose, UA: NEGATIVE mg/dL
HGB URINE DIPSTICK: NEGATIVE
KETONES UR: NEGATIVE mg/dL
Leukocytes, UA: NEGATIVE
NITRITE: NEGATIVE
Protein, ur: NEGATIVE mg/dL
Specific Gravity, Urine: 1.01 (ref 1.005–1.030)
pH: 6 (ref 5.0–8.0)

## 2015-10-15 LAB — RAPID URINE DRUG SCREEN, HOSP PERFORMED
AMPHETAMINES: NOT DETECTED
BENZODIAZEPINES: NOT DETECTED
Barbiturates: NOT DETECTED
Cocaine: NOT DETECTED
Opiates: NOT DETECTED
TETRAHYDROCANNABINOL: POSITIVE — AB

## 2015-10-15 LAB — ETHANOL: Alcohol, Ethyl (B): 305 mg/dL (ref <5)

## 2015-10-15 LAB — HEPATIC FUNCTION PANEL
ALT: 116 U/L — ABNORMAL HIGH (ref 17–63)
AST: 88 U/L — ABNORMAL HIGH (ref 15–41)
Albumin: 3.1 g/dL — ABNORMAL LOW (ref 3.5–5.0)
Alkaline Phosphatase: 118 U/L (ref 38–126)
Bilirubin, Direct: 0.3 mg/dL (ref 0.1–0.5)
Indirect Bilirubin: 0.6 mg/dL (ref 0.3–0.9)
Total Bilirubin: 0.9 mg/dL (ref 0.3–1.2)
Total Protein: 6.7 g/dL (ref 6.5–8.1)

## 2015-10-15 LAB — LIPASE, BLOOD: Lipase: 64 U/L — ABNORMAL HIGH (ref 11–51)

## 2015-10-15 LAB — TROPONIN I: Troponin I: <0.03 ng/mL (ref <0.031)

## 2015-10-15 LAB — AMMONIA: Ammonia: 21 umol/L (ref 9–35)

## 2015-10-15 MED ORDER — ONDANSETRON HCL 4 MG/2ML IJ SOLN
4.0000 mg | Freq: Once | INTRAMUSCULAR | Status: DC
  Filled 2015-10-15: qty 2

## 2015-10-15 MED ORDER — POTASSIUM CHLORIDE 10 MEQ/100ML IV SOLN
10.0000 meq | Freq: Once | INTRAVENOUS | Status: AC
  Administered 2015-10-15: 10 meq via INTRAVENOUS
  Filled 2015-10-15: qty 100

## 2015-10-15 MED ORDER — PANTOPRAZOLE SODIUM 40 MG IV SOLR
40.0000 mg | Freq: Once | INTRAVENOUS | Status: AC
  Administered 2015-10-15: 40 mg via INTRAVENOUS
  Filled 2015-10-15: qty 40

## 2015-10-15 MED ORDER — LIDOCAINE-EPINEPHRINE 1 %-1:200000 IJ SOLN
10.0000 mL | Freq: Once | INTRAMUSCULAR | Status: AC
Start: 2015-10-15 — End: 2015-10-15
  Administered 2015-10-15: 10 mL
  Filled 2015-10-15: qty 10

## 2015-10-15 MED ORDER — ONDANSETRON HCL 4 MG/2ML IJ SOLN
4.0000 mg | Freq: Once | INTRAMUSCULAR | Status: AC
  Administered 2015-10-15: 4 mg via INTRAVENOUS

## 2015-10-15 MED ORDER — DIAZEPAM 5 MG/ML IJ SOLN
2.5000 mg | Freq: Once | INTRAMUSCULAR | Status: AC
  Administered 2015-10-15: 2.5 mg via INTRAVENOUS
  Filled 2015-10-15: qty 2

## 2015-10-15 MED ORDER — SODIUM CHLORIDE 0.9 % IV BOLUS (SEPSIS)
500.0000 mL | Freq: Once | INTRAVENOUS | Status: AC
  Administered 2015-10-15: 500 mL via INTRAVENOUS

## 2015-10-15 NOTE — ED Provider Notes (Signed)
CSN: [id number]     Arrival date & time 10/15/15  1804 History   First MD Initiated Contact with Patient 10/15/15 1838     Chief Complaint  Patient presents with  . Weakness  . Cough     (Consider location/radiation/quality/duration/timing/severity/associated sxs/prior Treatment) HPI   68yM presenting with friend for evaluation of generalized weakness. Worsening over past few months. No energy. Unintentional weight loss. He estimates ~40 lbs. No fever or chills. Doesn't eat well. 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 No fever or chills. No acute pain complaints. Reports compliance with his medications.   Past Medical History  Diagnosis Date  . Obesity   . Hypertension   . Hyperlipidemia   . Systolic heart failure   . Coronary artery disease     native vessel  . AICD (automatic cardioverter/defibrillator) present 2006    [place] [address]  . Ischemic cardiomyopathy     ischemic  . OSA (obstructive sleep apnea)   . [place] Paroxysmal ventricular tachycardia ([place])     Rx via [facility] 2008  . Pulmonary embolism ([place])   . MI, old    Past Surgical History  Procedure Laterality Date  . Cardiac defibrillator placement  2006    [employer] 7232  . Knee surgery      left   . Implantable cardioverter defibrillator generator change N/A 09/02/2014    Procedure: IMPLANTABLE CARDIOVERTER DEFIBRILLATOR GENERATOR CHANGE;  Surgeon: Deboraha Sprang, MD;  Location: [facility] CATH LAB;  Service: Cardiovascular;  Laterality: N/A;  . Lead revision N/A 09/02/2014    Procedure: LEAD REVISION;  Surgeon: Deboraha Sprang, MD;  Location: [facility] CATH LAB;  Service: Cardiovascular;  Laterality: N/A;   No family history on file. Social History  Substance Use Topics  . Smoking status: Current Every Day Smoker -- 1.00 packs/day    Types: Cigarettes  . Smokeless tobacco: Never Used  . Alcohol Use: Yes     Comment: socially    Review of Systems  All systems reviewed and negative, other than as  noted in HPI.   Allergies  Ativan and Haldol  Home Medications   Prior to Admission medications   Medication Sig Start Date End Date Taking? Authorizing Provider  aspirin 81 MG EC tablet Take 81 mg by mouth every other day.  07/15/14   Eileen Stanford, PA-C  bisoprolol (ZEBETA) 5 MG tablet Take 5 mg by mouth 2 (two) times daily.    Historical Provider, MD  cholecalciferol (VITAMIN D) 1000 UNITS tablet Take 1,000 Units by mouth daily.     Historical Provider, MD  enalapril (VASOTEC) 10 MG tablet Take 10 mg by mouth daily.     Historical Provider, MD  folic acid (FOLVITE) 1 MG tablet Take 1 mg by mouth daily. 11/17/13   Historical Provider, MD  furosemide (LASIX) 40 MG tablet Take 40 mg by mouth daily.    Historical Provider, MD  KLOR-CON M20 20 MEQ tablet Take 20 mEq by mouth daily. 11/17/13   Historical Provider, MD  mometasone (NASONEX) 50 MCG/ACT nasal spray Place 2 sprays into the nose daily as needed. For nasal congestion    Historical Provider, MD  nitroGLYCERIN (NITROSTAT) 0.4 MG SL tablet Place 0.4 mg under the tongue every 5 (five) minutes as needed for chest pain.    Historical Provider, MD  omeprazole (PRILOSEC) 40 MG capsule Take 40 mg by mouth daily.    Historical Provider, MD  pravastatin (PRAVACHOL) 20 MG  tablet Take 1 tablet by mouth daily. 05/18/15   Historical Provider, MD  rivaroxaban (XARELTO) 20 MG TABS tablet Take 20 mg by mouth daily with supper.    Carole Civil, MD  spironolactone (ALDACTONE) 25 MG tablet Take 1 tablet (25 mg total) by mouth daily. 09/03/14   Rogelia Mire, NP  spironolactone (ALDACTONE) 25 MG tablet TAKE ONE TABLET BY MOUTH ONCE DAILY 10/07/15   Deboraha Sprang, MD  tetrahydrozoline-zinc (VISINE-AC) 0.05-0.25 % ophthalmic solution Place 2 drops into both eyes 3 (three) times daily as needed (redness relief).    Historical Provider, MD   BP 136/107 mmHg  Pulse 142  Temp(Src) 97.8 F (36.6 C) (Oral)  Resp 18  Ht 6' (1.829 m)  Wt 214 lb  (97.07 kg)  BMI 29.02 kg/m2  SpO2 99% Physical Exam  Constitutional: He appears well-developed and well-nourished. No distress.  HENT:  Head: Normocephalic and atraumatic.  Small abscess to chin w/o cellulitis  Eyes: Conjunctivae are normal. Right eye exhibits no discharge. Left eye exhibits no discharge.  Neck: Neck supple.  Cardiovascular: Regular rhythm and normal heart sounds.  Exam reveals no gallop and no friction rub.   No murmur heard. tachycardic  Pulmonary/Chest: Effort normal and breath sounds normal. No respiratory distress.  Abdominal: Soft. He exhibits no distension. There is no tenderness.  Musculoskeletal: He exhibits no edema or tenderness.  Neurological: He is alert.  Skin: Skin is warm and dry.  Psychiatric: He has a normal mood and affect. His behavior is normal. Thought content normal.  Nursing note and vitals reviewed.   ED Course  Procedures (including critical care time) Labs Review Labs Reviewed  CBC WITH DIFFERENTIAL/PLATELET - Abnormal; Notable for the following:    RBC 3.81 (*)    Hemoglobin 12.6 (*)    HCT 36.2 (*)    Monocytes Absolute 1.1 (*)    All other components within normal limits  BASIC METABOLIC PANEL - Abnormal; Notable for the following:    Sodium 134 (*)    Potassium 3.3 (*)    Chloride 96 (*)    Glucose, Bld 104 (*)    Calcium 8.7 (*)    All other components within normal limits  ETHANOL - Abnormal; Notable for the following:    Alcohol, Ethyl (B) 305 (*)    All other components within normal limits  URINE RAPID DRUG SCREEN, HOSP PERFORMED - Abnormal; Notable for the following:    Tetrahydrocannabinol POSITIVE (*)    All other components within normal limits  HEPATIC FUNCTION PANEL - Abnormal; Notable for the following:    Albumin 3.1 (*)    AST 88 (*)    ALT 116 (*)    All other components within normal limits  LIPASE, BLOOD - Abnormal; Notable for the following:    Lipase 64 (*)    All other components within normal  limits  URINALYSIS, ROUTINE W REFLEX MICROSCOPIC (NOT AT [facility])  TROPONIN I  AMMONIA    Imaging Review No results found. I have personally reviewed and evaluated these images and lab results as part of my medical decision-making.   EKG Interpretation   Date/Time:  Friday October 15 2015 19:57:36 EST Ventricular Rate:  133 PR Interval:  150 QRS Duration: 93 QT Interval:  316 QTC Calculation: 470 R Axis:   80 Text Interpretation:  Sinus tachycardia Inferior infarct, old ED PHYSICIAN  INTERPRETATION AVAILABLE IN [place] Confirmed by TEST, Record  ([id number]) on 10/16/2015 9:22:13 AM  MDM   Final diagnoses:  Generalized weakness  Sinus tachycardia ([place])    54yM with generalized weakness. Suspect ongoing etoh abuse catching up with him. Doubt emergent process. Afebrile. Tachycardic, but poor po intake aside from alcohol. Doubt sepsis, pe or other emergent pathology. It has been determined that no acute conditions requiring further emergency intervention are present at this time. The patient has been advised of the diagnosis and plan. I reviewed any labs and imaging including any potential incidental findings. We have discussed signs and symptoms that warrant return to the ED and they are listed in the discharge instructions.      Virgel Manifold, MD 10/26/15 [phone]

## 2015-10-15 NOTE — ED Notes (Signed)
Per wife, pt has been c/o of generalized weakness, cough, and emesis for the past few weeks. Pt also has lost 40lbs in 7 weeks. Pt AOx4.

## 2015-10-15 NOTE — Discharge Instructions (Signed)
Fatigue  Fatigue is feeling tired all of the time, a lack of energy, or a lack of motivation. Occasional or mild fatigue is often a normal response to activity or life in general. However, long-lasting (chronic) or extreme fatigue may indicate an underlying medical condition.  HOME CARE INSTRUCTIONS   Watch your fatigue for any changes. The following actions may help to lessen any discomfort you are feeling:  · Talk to your health care provider about how much sleep you need each night. Try to get the required amount every night.  · Take medicines only as directed by your health care provider.  · Eat a healthy and nutritious diet. Ask your health care provider if you need help changing your diet.  · Drink enough fluid to keep your urine clear or pale yellow.  · Practice ways of relaxing, such as yoga, meditation, massage therapy, or acupuncture.  · Exercise regularly.    · Change situations that cause you stress. Try to keep your work and personal routine reasonable.  · Do not abuse illegal drugs.  · Limit alcohol intake to no more than 1 drink per day for nonpregnant women and 2 drinks per day for men. One drink equals 12 ounces of beer, 5 ounces of wine, or 1½ ounces of hard liquor.  · Take a multivitamin, if directed by your health care provider.  SEEK MEDICAL CARE IF:   · Your fatigue does not get better.  · You have a fever.    · You have unintentional weight loss or gain.  · You have headaches.    · You have difficulty:      Falling asleep.    Sleeping throughout the night.  · You feel angry, guilty, anxious, or sad.     · You are unable to have a bowel movement (constipation).    · You skin is dry.     · Your legs or another part of your body is swollen.    SEEK IMMEDIATE MEDICAL CARE IF:   · You feel confused.    · Your vision is blurry.  · You feel faint or pass out.    · You have a severe headache.    · You have severe abdominal, pelvic, or back pain.    · You have chest pain, shortness of breath, or an  irregular or fast heartbeat.    · You are unable to urinate or you urinate less than normal.    · You develop abnormal bleeding, such as bleeding from the rectum, vagina, nose, lungs, or nipples.  · You vomit blood.     · You have thoughts about harming yourself or committing suicide.    · You are worried that you might harm someone else.       This information is not intended to replace advice given to you by your health care provider. Make sure you discuss any questions you have with your health care provider.     Document Released: 08/20/2007 Document Revised: 11/13/2014 Document Reviewed: 02/24/2014  Elsevier Interactive Patient Education ©2016 [employer]

## 2015-10-15 NOTE — ED Notes (Signed)
MD at bedside. 

## 2015-10-21 ENCOUNTER — Emergency Department (HOSPITAL_COMMUNITY)
Admission: EM | Admit: 2015-10-21 | Discharge: 2015-10-21 | Disposition: A | Discharge status: Home / Self Care | Payer: Medicare PPO | Attending: Emergency Medicine | Admitting: Emergency Medicine

## 2015-10-21 ENCOUNTER — Encounter (HOSPITAL_COMMUNITY): Payer: Self-pay | Admitting: Emergency Medicine

## 2015-10-21 DIAGNOSIS — I5020 Unspecified systolic (congestive) heart failure: Secondary | ICD-10-CM | POA: Diagnosis not present

## 2015-10-21 DIAGNOSIS — I10 Essential (primary) hypertension: Secondary | ICD-10-CM | POA: Insufficient documentation

## 2015-10-21 DIAGNOSIS — R000 Tachycardia, unspecified: Secondary | ICD-10-CM | POA: Insufficient documentation

## 2015-10-21 DIAGNOSIS — E785 Hyperlipidemia, unspecified: Secondary | ICD-10-CM | POA: Insufficient documentation

## 2015-10-21 DIAGNOSIS — E669 Obesity, unspecified: Secondary | ICD-10-CM | POA: Diagnosis not present

## 2015-10-21 DIAGNOSIS — I252 Old myocardial infarction: Secondary | ICD-10-CM | POA: Insufficient documentation

## 2015-10-21 DIAGNOSIS — F17210 Nicotine dependence, cigarettes, uncomplicated: Secondary | ICD-10-CM | POA: Insufficient documentation

## 2015-10-21 DIAGNOSIS — Z8669 Personal history of other diseases of the nervous system and sense organs: Secondary | ICD-10-CM | POA: Diagnosis not present

## 2015-10-21 DIAGNOSIS — Z7901 Long term (current) use of anticoagulants: Secondary | ICD-10-CM | POA: Diagnosis not present

## 2015-10-21 DIAGNOSIS — Z95810 Presence of automatic (implantable) cardiac defibrillator: Secondary | ICD-10-CM | POA: Insufficient documentation

## 2015-10-21 DIAGNOSIS — Z7982 Long term (current) use of aspirin: Secondary | ICD-10-CM | POA: Insufficient documentation

## 2015-10-21 DIAGNOSIS — F1010 Alcohol abuse, uncomplicated: Secondary | ICD-10-CM | POA: Insufficient documentation

## 2015-10-21 DIAGNOSIS — I2510 Atherosclerotic heart disease of native coronary artery without angina pectoris: Secondary | ICD-10-CM | POA: Diagnosis not present

## 2015-10-21 DIAGNOSIS — Z86711 Personal history of pulmonary embolism: Secondary | ICD-10-CM | POA: Insufficient documentation

## 2015-10-21 DIAGNOSIS — Z79899 Other long term (current) drug therapy: Secondary | ICD-10-CM | POA: Diagnosis not present

## 2015-10-21 NOTE — Discharge Instructions (Signed)
Alcohol Abuse and Nutrition Alcohol abuse is any pattern of alcohol consumption that harms your health, relationships, or work. Alcohol abuse can affect how your body breaks down and absorbs nutrients from food by causing your liver to work abnormally. Additionally, many people who abuse alcohol do not eat enough carbohydrates, protein, fat, vitamins, and minerals. This can cause poor nutrition (malnutrition) and a lack of nutrients (nutrient deficiencies), which can lead to further complications. Nutrients that are commonly lacking (deficient) among people who abuse alcohol include:  Vitamins.  Vitamin A. This is stored in your liver. It is important for your vision, metabolism, and ability to fight off infections (immunity).  B vitamins. These include vitamins such as folate, thiamin, and niacin. These are important in new cell growth and maintenance.  Vitamin C. This plays an important role in iron absorption, wound healing, and immunity.  Vitamin D. This is produced by your liver, but you can also get vitamin D from food. Vitamin D is necessary for your body to absorb and use calcium.  Minerals.  Calcium. This is important for your bones and your heart and blood vessel (cardiovascular) function.  Iron. This is important for blood, muscle, and nervous system functioning.  Magnesium. This plays an important role in muscle and nerve function, and it helps to control blood sugar and blood pressure.  Zinc. This is important for the normal function of your nervous system and digestive system (gastrointestinal tract). Nutrition is an essential component of therapy for alcohol abuse. Your health care provider or dietitian will work with you to design a plan that can help restore nutrients to your body and prevent potential complications. WHAT IS MY PLAN? Your dietitian may develop a specific diet plan that is based on your condition and any other complications you may have. A diet plan will  commonly include:  A balanced diet.  Grains: 6-8 oz per day.  Vegetables: 2-3 cups per day.  Fruits: 1-2 cups per day.  Meat and other protein: 5-6 oz per day.  Dairy: 2-3 cups per day.  Vitamin and mineral supplements. WHAT DO I NEED TO KNOW ABOUT ALCOHOL AND NUTRITION?  Consume foods that are high in antioxidants, such as grapes, berries, nuts, green tea, and dark green and orange vegetables. This can help to counteract some of the stress that is placed on your liver by consuming alcohol.  Avoid food and drinks that are high in fat and sugar. Foods such as sugared soft drinks, salty snack foods, and candy contain empty calories. This means that they lack important nutrients such as protein, fiber, and vitamins.  Eat frequent meals and snacks. Try to eat 5-6 small meals each day.  Eat a variety of fresh fruits and vegetables each day. This will help you get plenty of water, fiber, and vitamins in your diet.  Drink plenty of water and other clear fluids. Try to drink at least 48-64 oz (1.5-2 L) of water per day.  If you are a vegetarian, eat a variety of protein-rich foods. Pair whole grains with plant-based proteins at meals and snacks to obtain the greatest nutrient benefit from your food. For example, eat rice with beans, put peanut butter on whole-grain toast, or eat oatmeal with sunflower seeds.  Soak beans and whole grains overnight before cooking. This can help your body to absorb the nutrients more easily.  Include foods fortified with vitamins and minerals in your diet. Commonly fortified foods include milk, orange juice, cereal, and bread.  If you  are malnourished, your dietitian may recommend a high-protein, high-calorie diet. This may include:  2,000-3,000 calories (kilocalories) per day.  70-100 grams of protein per day.  Your health care provider may recommend a complete nutritional supplement beverage. This can help to restore calories, protein, and vitamins to  your body. Depending on your condition, you may be advised to consume this instead of or in addition to meals.  Limit your intake of caffeine. Replace drinks like coffee and black tea with decaffeinated coffee and herbal tea.  Eat a variety of foods that are high in omega fatty acids. These include fish, nuts and seeds, and soybeans. These foods may help your liver to recover and may also stabilize your mood.  Certain medicines may cause changes in your appetite, taste, and weight. Work with your health care provider and dietitian to make any adjustments to your medicines and diet plan.  Include other healthy lifestyle choices in your daily routine.  Be physically active.  Get enough sleep.  Spend time doing activities that you enjoy.  If you are unable to take in enough food and calories by mouth, your health care provider may recommend a feeding tube. This is a tube that passes through your nose and throat, directly into your stomach. Nutritional supplement beverages can be given to you through the feeding tube to help you get the nutrients you need.  Take vitamin or mineral supplements as recommended by your health care provider. WHAT FOODS CAN I EAT? Grains Enriched pasta. Enriched rice. Fortified whole-grain bread. Fortified whole-grain cereal. Barley. Barkey rice. Quinoa. [place]. Vegetables All fresh, frozen, and canned vegetables. Spinach. Kale. Artichoke. Carrots. Winter squash and pumpkin. Sweet potatoes. Broccoli. Cabbage. Cucumbers. Tomatoes. Sweet peppers. Green beans. Peas. Corn. Fruits All fresh and frozen fruits. Berries. Grapes. Mango. Papaya. Guava. Cherries. Apples. Bananas. Peaches. Plums. Pineapple. Watermelon. Cantaloupe. Oranges. Avocado. Meats and Other Protein Sources Beef liver. Lean beef. Pork. Fresh and canned chicken. Fresh fish. Oysters. Sardines. Canned tuna. Shrimp. Eggs with yolks. Nuts and seeds. Peanut butter. Beans and lentils. Soybeans.  Tofu. Dairy Whole, low-fat, and nonfat milk. Whole, low-fat, and nonfat yogurt. Cottage cheese. Sour cream. Hard and soft cheeses. Beverages Water. Herbal tea. Decaffeinated coffee. Decaffeinated green tea. 100% fruit juice. 100% vegetable juice. Instant breakfast shakes. Condiments Ketchup. Mayonnaise. Mustard. Salad dressing. Barbecue sauce. Sweets and Desserts Sugar-free ice cream. Sugar-free pudding. Sugar-free gelatin. Fats and Oils Butter. Vegetable oil, flaxseed oil, olive oil, and walnut oil. Other Complete nutrition shakes. Protein bars. Sugar-free gum. The items listed above may not be a complete list of recommended foods or beverages. Contact your dietitian for more options. WHAT FOODS ARE NOT RECOMMENDED? Grains Sugar-sweetened breakfast cereals. Flavored instant oatmeal. Fried breads. Vegetables Breaded or deep-fried vegetables. Fruits Dried fruit with added sugar. Candied fruit. Canned fruit in syrup. Meats and Other Protein Sources Breaded or deep-fried meats. Dairy Flavored milks. Fried cheese curds or fried cheese sticks. Beverages Alcohol. Sugar-sweetened soft drinks. Sugar-sweetened tea. Caffeinated coffee and tea. Condiments Sugar. Honey. Agave nectar. Molasses. Sweets and Desserts Chocolate. Cake. Cookies. Candy. Other Potato chips. Pretzels. Salted nuts. Candied nuts. The items listed above may not be a complete list of foods and beverages to avoid. Contact your dietitian for more information.   This information is not intended to replace advice given to you by your health care provider. Make sure you discuss any questions you have with your health care provider.   Document Released: 08/17/2005 Document Revised: 11/13/2014 Document Reviewed: 05/26/2014 Elsevier Interactive Patient  Education ©2016 Elsevier Inc. ° °Emergency Department Resource Guide °1) Find a Doctor and Pay Out of Pocket °Although you won't have to find out who is covered by your insurance  plan, it is a good idea to ask around and get recommendations. You will then need to call the office and see if the doctor you have chosen will accept you as a new patient and what types of options they offer for patients who are self-pay. Some doctors offer discounts or will set up payment plans for their patients who do not have insurance, but you will need to ask so you aren't surprised when you get to your appointment. ° °2) Contact Your Local Health Department °Not all health departments have doctors that can see patients for sick visits, but many do, so it is worth a call to see if yours does. If you don't know where your local health department is, you can check in your phone book. The CDC also has a tool to help you locate your state's health department, and many state websites also have listings of all of their local health departments. ° °3) Find a Walk-in Clinic °If your illness is not likely to be very severe or complicated, you may want to try a walk in clinic. These are popping up all over the country in pharmacies, drugstores, and shopping centers. They're usually staffed by nurse practitioners or physician assistants that have been trained to treat common illnesses and complaints. They're usually fairly quick and inexpensive. However, if you have serious medical issues or chronic medical problems, these are probably not your best option. ° °No Primary Care Doctor: °- Call [facility] at  832-8000 - they can help you locate a primary care doctor that  accepts your insurance, provides certain services, etc. °- Physician Referral Service- 1-800-533-3463 ° °Chronic Pain Problems: °Organization         Address  Phone   Notes  °[facility]  (336) 297-2271 Patients need to be referred by their primary care doctor.  ° °Medication Assistance: °Organization         Address  Phone   Notes  °[place] [facility] [address] °[place], [place] [address]  (336) 641-8030 --Must be a resident of [place] °-- Must have NO insurance coverage whatsoever (no Medicaid/ Medicare, etc.) °-- The pt. MUST have a primary care doctor that directs their care regularly and follows them in the community °  °[facility]  (866) 331-1348   °[employer]  (888) 892-1162   ° °Agencies that provide inexpensive medical care: °Organization         Address  Phone   Notes  °[facility]  (336) 832-8035   °[facility]    (336) 832-7272   °[facility] [address] °[place], [place] [address] (336) 832-4777   °[facility] [address], °[place] (336) 271-4999   °[facility]    (336) 373-0678   °[facility]    (336) 272-1050   °[facility] ° [address], [place] Phone:  (336) 832-4444, Fax:  (336) 832-4440 Hours of Operation:  9 am - 6 pm, M-F.  Also accepts Medicaid/Medicare and self-pay.  °[facility] ° [address],  Phone: (336) 832-3150, Fax: (336) 832-3151. Hours of Operation:  8:30 am - 5:30 pm, M-F.  Also accepts Medicaid and   self-pay.  °[facility] [place] [address], [place] Phone: (336) 878-6027   °[facility] [address], [place], [place] (336)723-1848, Ext. 123 Mondays & Thursdays: 7-9 AM.  First 15 patients are seen on a first come, first serve basis. °  ° °Medicaid-accepting [place] Providers: ° °Organization         Address  Phone   Notes  °[facility] [address], [place] (336) 641-2100 Also accepts self-pay patients.  °[facility] [address], [place] ° (336) 856-9996   °[facility] [address], [place] (336) 288-8857   °[facility] [address] [place] [address], [place] (336) 299-7000   °Veita Bland [address], [place]  ° (336)  373-1557 Only accepts [place] Medicaid patients after they have their name applied to their card.  ° °Self-Pay (no insurance) in [place]: ° °Organization         Address  Phone   Notes  °Sickle Cell Patients, [facility] [address], [place] (336) 832-1970   °[facility] [address], [place] (336) 832-4400   °[facility] ° [address], [place] (336) 992-4800   °[facility]/Dr. Osei-Bonsu ° [address] [place] [address], [place] or [address], [place] (336) 841-8500 Phone number for both [place] and [place] locations is the same.  °[facility] [address], [place] (336) 299-0000   °[facility] [address] [place] [address], [place] or [address] (336) 852-7530 °(336) 878-2260   °[facility] [address], [place] (336) 350-1642, phone; (336) 294-5005, fax Sees patients 1st and 3rd Saturday of every month.  Must not qualify for public or private insurance (i.e. Medicaid, Medicare, [place] Health Choice, Veterans' Benefits) • Household income should be no more than 200% of the poverty level •The clinic cannot treat you if you are pregnant or think you are pregnant • Sexually transmitted diseases are not treated at the clinic.  ° ° °Dental Care: °Organization         Address  Phone  Notes  °[facility] [address], [place] (336) 641-6152 Accepts children up to age 21 who are enrolled in Medicaid or [place] Health Choice; pregnant women with a Medicaid card; and children who have applied for Medicaid or [place] Health Choice, but were declined, whose parents can pay a reduced fee at time of service.  °[facility] [place]  [address], [place] (336) 641-7733 Accepts children up to age 21 who are enrolled in Medicaid or [place] Health  Choice; pregnant women with a Medicaid card; and children who have applied for Medicaid or  Health Choice, but were declined, whose parents can pay a reduced fee at time of service.  °[facility] ° [address], [place] (336) 641-4533 Patients are seen by appointment only. Walk-ins are not accepted. [facility] will see patients 18 years of age and older. °Monday - Tuesday (8am-5pm) °Most Wednesdays (8:30-5pm) °$30 per visit, cash only  °[facility] ° [address], [place] (336) 641-4533 Patients are seen by appointment only. Walk-ins are not accepted. [facility] will see patients 18   years of age and older. °One Wednesday Evening (Monthly: Volunteer Based).  $30 per visit, cash only  °[facility]  (919) 537-3737 for adults; Children under age 4, call Graduate Pediatric Dentistry at (919) [phone]. Children aged 4-14, please call (919) [phone] to request a pediatric application. ° Dental services are provided in all areas of dental care including fillings, crowns and bridges, complete and partial dentures, implants, gum treatment, root canals, and extractions. Preventive care is also provided. Treatment is provided to both adults and children. °Patients are selected via a lottery and there is often a waiting list. °  °[facility] [address], °[place] ° (336) 763-8833 [email or web address] °  °[facility] [address], [place], [place] (336)723-1848, Ext. 123 Second and Fourth Thursday of each month, opens at 6:30 AM; Clinic ends at 9 AM.  Patients are seen on a first-come first-served basis, and a limited number are seen during each clinic.  ° °[facility] ° [address], [place], [place] (336) 723-7904   Eligibility Requirements °You must have lived in [place], [place], or [place] counties for at least the last three months. °  You cannot be eligible for state or  federal sponsored healthcare insurance, including Veterans Administration, Medicaid, or Medicare. °  You generally cannot be eligible for healthcare insurance through your employer.  °  How to apply: °Eligibility screenings are held every Tuesday and Wednesday afternoon from 1:00 pm until 4:00 pm. You do not need an appointment for the interview!  °[facility] [address], [place], [place] 336-631-2330   °[facility]  336-342-8273   °[facility]  336-703-3100   °[facility]  336-570-6415   ° °Behavioral Health Resources in the Community: °Intensive Outpatient Programs °Organization         Address  Phone  Notes  °[facility] [address], [place], [place] 336-878-6098   °[facility] [address], [place], [place] 336-832-9800   °[facility] [address], [place], [place] ° 336-882-2125   °[facility] [address],  °[place], [place] 1-800-853-5163 or 336-641-4981   °Substance Abuse Resources °Organization         Address  Phone  Notes  °[facility]  336-882-2125   °[facility]  336-784-9470   °[facility]  336-285-9073   °[facility]  336-845-3988   °Residential & Outpatient Substance Abuse Program  1-800-659-3381   °Psychological Services °Organization         Address  Phone  Notes  °[facility]  336- 832-9600   °[facility]  336- 378-7881   °[facility] [address], [place] 1-800-853-5163 or 336-641-4981   ° °Mobile Crisis Teams °Organization         Address  Phone  Notes  °[facility], Mobile Crisis Care Unit  1-877-626-1772   °Assertive °[facility] ° [address] [place], [place] 336-834-9664   °Sharon DeEsch [address] °[place] 336-554-5454   ° °Self-Help/Support Groups °Organization         Address  Phone              Notes  °[facility]  - variety of support groups  336- 373-1402 Call for more information  °Narcotics Anonymous (NA), [facility] [address], °[place]    2 meetings at this location  ° °Residential Treatment Programs °Organization         Address  Phone  Notes  °[facility] [address],    °[place]  1-866-801-8205   °[facility] ° [address], [place], [place] 704-293-8524   °[facility] [address], [place] 336-845-3988 Admissions: 8am-3pm M-F  °[facility] [address]    °[place], [place] 336-841-1104   °[facility] [address], [place], [place] 336-379-7146   °[facility] [address]  °[place], [place] 336-285-9073   °[facility] Intensive Outpatient [address], [place], [place] 336-852-3033   °[facility] [address]  °[place], [place] 1-877-615-2722 or 336-784-9470   °[facility] [address] [place], [place] 336-227-7417 Accepts Medicaid  °[facility] [address]  °[place] 1-800-659-3381 Substance Abuse/Addiction Treatment  ° °[place] Behavioral Health Resources °Organization         Address  Phone  Notes  °[facility]  (888) 581-9988   °Julie Brannon, PhD [address] [place], [place]   (336) 349-5553 or (336) 951-0000   °[facility]   [address] °[place], [place] (336) 349-4454   °[facility] [address], [place], [place] (336) 342-8316 Insurance/Medicaid/sponsorship through [facility]  °[facility] [address]                                    [place], [place] (336) 342-8316 Therapy/tele-psych/case  °[facility] [address]  ° [place], [place] (336) 349-2233    °Dr. Arfeen  (336) 349-4544   °[facility] [place]  [employer] [place] [facility]  [facility]pt. 1) [address], [place] °2) [address], [place] °3)  [address], [place] (336) 349-3220 °(336) 342-7768 ° °(336) 342-8140   °[place] [facility] (336) 342-1394 or (336) 342-3537 (After Hours)    ° ° ° °

## 2015-10-21 NOTE — ED Notes (Addendum)
Pt states he was at [facility]. Says they told him to come here for alcohol rehab and that he spoke to someone who had set all of this up. HR 122, does have an ICD, says his last drink was about 2 hours ago. Denies CP, SOB, N/V/D  Friend states he's lost 30 lbs over the last week. Pt states he's had problems swallowing recently d/t thrush

## 2015-10-21 NOTE — ED Provider Notes (Signed)
CSN: [id number]     Arrival date & time 10/21/15  1834 History   First MD Initiated Contact with Patient 10/21/15 1854     Chief Complaint  Patient presents with  . Alcohol Problem     (Consider location/radiation/quality/duration/timing/severity/associated sxs/prior Treatment) Patient is a 54 y.o. male presenting with alcohol problem. The history is provided by the patient.  Alcohol Problem This is a chronic problem. The current episode started more than 1 week ago. The problem occurs constantly. The problem has not changed since onset.Pertinent negatives include no chest pain, no abdominal pain and no shortness of breath. Nothing aggravates the symptoms. Nothing relieves the symptoms. He has tried nothing for the symptoms.    Past Medical History  Diagnosis Date  . Obesity   . Hypertension   . Hyperlipidemia   . Systolic heart failure   . Coronary artery disease     native vessel  . AICD (automatic cardioverter/defibrillator) present 2006    [place] 7232  . Ischemic cardiomyopathy     ischemic  . OSA (obstructive sleep apnea)   . [place] Paroxysmal ventricular tachycardia ([place])     Rx via ATP 2008  . Pulmonary embolism ([place])   . MI, old    Past Surgical History  Procedure Laterality Date  . Cardiac defibrillator placement  2006    [employer]tronic Maximo 7232  . Knee surgery      left   . Implantable cardioverter defibrillator generator change N/A 09/02/2014    Procedure: IMPLANTABLE CARDIOVERTER DEFIBRILLATOR GENERATOR CHANGE;  Surgeon: Deboraha Sprang, MD;  Location: [facility] CATH LAB;  Service: Cardiovascular;  Laterality: N/A;  . Lead revision N/A 09/02/2014    Procedure: LEAD REVISION;  Surgeon: Deboraha Sprang, MD;  Location: [facility] CATH LAB;  Service: Cardiovascular;  Laterality: N/A;   History reviewed. No pertinent family history. Social History  Substance Use Topics  . Smoking status: Current Every Day Smoker -- 1.00 packs/day    Types: Cigarettes  .  Smokeless tobacco: Never Used  . Alcohol Use: Yes     Comment: socially    Review of Systems  Respiratory: Negative for shortness of breath.   Cardiovascular: Negative for chest pain.  Gastrointestinal: Negative for abdominal pain.  All other systems reviewed and are negative.     Allergies  Ativan and Haldol  Home Medications   Prior to Admission medications   Medication Sig Start Date End Date Taking? Authorizing Provider  aspirin 81 MG EC tablet Take 81 mg by mouth every other day.  07/15/14   Eileen Stanford, PA-C  bisoprolol (ZEBETA) 5 MG tablet Take 5 mg by mouth 2 (two) times daily.    Historical Provider, MD  cholecalciferol (VITAMIN D) 1000 UNITS tablet Take 1,000 Units by mouth daily.     Historical Provider, MD  enalapril (VASOTEC) 10 MG tablet Take 10 mg by mouth daily.     Historical Provider, MD  folic acid (FOLVITE) 1 MG tablet Take 1 mg by mouth daily. 11/17/13   Historical Provider, MD  furosemide (LASIX) 40 MG tablet Take 40 mg by mouth daily.    Historical Provider, MD  KLOR-CON M20 20 MEQ tablet Take 20 mEq by mouth daily. 11/17/13   Historical Provider, MD  mometasone (NASONEX) 50 MCG/ACT nasal spray Place 2 sprays into the nose daily as needed. For nasal congestion    Historical Provider, MD  nitroGLYCERIN (NITROSTAT) 0.4 MG SL tablet Place 0.4 mg under the tongue every  5 (five) minutes as needed for chest pain.    Historical Provider, MD  omeprazole (PRILOSEC) 40 MG capsule Take 40 mg by mouth daily.    Historical Provider, MD  omeprazole (PRILOSEC) 40 MG capsule TAKE ONE CAPSULE BY MOUTH ONCE DAILY 10/05/15   Historical Provider, MD  pravastatin (PRAVACHOL) 20 MG tablet Take 1 tablet by mouth daily. 05/18/15   Historical Provider, MD  rivaroxaban (XARELTO) 20 MG TABS tablet Take 20 mg by mouth daily with supper.    Carole Civil, MD  spironolactone (ALDACTONE) 25 MG tablet Take 1 tablet (25 mg total) by mouth daily. 09/03/14   Rogelia Mire, NP   spironolactone (ALDACTONE) 25 MG tablet TAKE ONE TABLET BY MOUTH ONCE DAILY 10/07/15   Deboraha Sprang, MD  tetrahydrozoline-zinc (VISINE-AC) 0.05-0.25 % ophthalmic solution Place 2 drops into both eyes 3 (three) times daily as needed (redness relief).    Historical Provider, MD   BP 134/77 mmHg  Pulse 125  Temp(Src) 97.4 F (36.3 C) (Oral)  Resp 18  SpO2 100% Physical Exam  Constitutional: He is oriented to person, place, and time. He appears well-developed and well-nourished. No distress.  HENT:  Head: Normocephalic and atraumatic.  Eyes: Conjunctivae are normal.  Neck: Neck supple. No tracheal deviation present.  Cardiovascular: Regular rhythm and normal heart sounds.  Tachycardia present.   Pulmonary/Chest: Effort normal and breath sounds normal. No respiratory distress.  Abdominal: Soft. He exhibits no distension.  Neurological: He is alert and oriented to person, place, and time.  Skin: Skin is warm and dry.  Psychiatric: He has a normal mood and affect.    ED Course  Procedures (including critical care time) Labs Review none  Imaging Review No results found. I have personally reviewed and evaluated these images and lab results as part of my medical decision-making.   EKG Interpretation   Date/Time:  Thursday October 21 2015 18:47:29 EST Ventricular Rate:  119 PR Interval:  145 QRS Duration: 105 QT Interval:  353 QTC Calculation: 497 R Axis:   58 Text Interpretation:  Sinus tachycardia Inferior infarct, old Lateral  leads are also involved ED PHYSICIAN INTERPRETATION AVAILABLE IN [place]  [place] Confirmed by TEST, Record ([id number]) on 10/22/2015 6:50:39 AM      MDM   Final diagnoses:  Alcohol abuse    54 year old male presents after being told by his primary care physician that he should come to the [facility] emergency department for medical clearance to go to inpatient alcohol rehabilitation. There is no record of an office contacting this facility or  arranging this. Explained to Pt that the ED is not used for this purpose but would be happy to evaluate him for medical emergency. No current medical complaints. Has had sinus tachycardia on prior visits and this is unchanged.     Leo Grosser, MD 10/22/15 [phone]

## 2015-10-25 ENCOUNTER — Telehealth: Payer: Self-pay | Admitting: *Deleted

## 2015-10-25 ENCOUNTER — Institutional Professional Consult (permissible substitution) (INDEPENDENT_AMBULATORY_CARE_PROVIDER_SITE_OTHER): Payer: Medicare PPO | Admitting: Internal Medicine

## 2015-10-25 ENCOUNTER — Emergency Department (HOSPITAL_COMMUNITY): Payer: Medicare PPO

## 2015-10-25 ENCOUNTER — Inpatient Hospital Stay (HOSPITAL_COMMUNITY)
Admission: EM | Admit: 2015-10-25 | Discharge: 2015-10-30 | DRG: 896 | Disposition: A | Discharge status: Home / Self Care | Payer: Medicare PPO | Attending: Internal Medicine | Admitting: Internal Medicine

## 2015-10-25 ENCOUNTER — Encounter (HOSPITAL_COMMUNITY): Payer: Self-pay | Admitting: Emergency Medicine

## 2015-10-25 ENCOUNTER — Encounter (INDEPENDENT_AMBULATORY_CARE_PROVIDER_SITE_OTHER): Payer: Self-pay | Admitting: *Deleted

## 2015-10-25 DIAGNOSIS — R1011 Right upper quadrant pain: Secondary | ICD-10-CM | POA: Diagnosis not present

## 2015-10-25 DIAGNOSIS — R079 Chest pain, unspecified: Secondary | ICD-10-CM

## 2015-10-25 DIAGNOSIS — R0789 Other chest pain: Secondary | ICD-10-CM | POA: Diagnosis present

## 2015-10-25 DIAGNOSIS — Z95810 Presence of automatic (implantable) cardiac defibrillator: Secondary | ICD-10-CM

## 2015-10-25 DIAGNOSIS — G9340 Encephalopathy, unspecified: Secondary | ICD-10-CM | POA: Diagnosis present

## 2015-10-25 DIAGNOSIS — E440 Moderate protein-calorie malnutrition: Secondary | ICD-10-CM | POA: Diagnosis present

## 2015-10-25 DIAGNOSIS — I10 Essential (primary) hypertension: Secondary | ICD-10-CM | POA: Diagnosis not present

## 2015-10-25 DIAGNOSIS — R1031 Right lower quadrant pain: Secondary | ICD-10-CM | POA: Diagnosis not present

## 2015-10-25 DIAGNOSIS — F10939 Alcohol use, unspecified with withdrawal, unspecified: Secondary | ICD-10-CM | POA: Diagnosis present

## 2015-10-25 DIAGNOSIS — I5022 Chronic systolic (congestive) heart failure: Secondary | ICD-10-CM | POA: Diagnosis present

## 2015-10-25 DIAGNOSIS — K5900 Constipation, unspecified: Secondary | ICD-10-CM | POA: Diagnosis present

## 2015-10-25 DIAGNOSIS — G253 Myoclonus: Secondary | ICD-10-CM | POA: Diagnosis present

## 2015-10-25 DIAGNOSIS — E785 Hyperlipidemia, unspecified: Secondary | ICD-10-CM | POA: Diagnosis present

## 2015-10-25 DIAGNOSIS — E860 Dehydration: Secondary | ICD-10-CM | POA: Diagnosis present

## 2015-10-25 DIAGNOSIS — I252 Old myocardial infarction: Secondary | ICD-10-CM | POA: Diagnosis not present

## 2015-10-25 DIAGNOSIS — F10930 Alcohol use, unspecified with withdrawal, uncomplicated: Secondary | ICD-10-CM

## 2015-10-25 DIAGNOSIS — Y903 Blood alcohol level of 60-79 mg/100 ml: Secondary | ICD-10-CM | POA: Diagnosis present

## 2015-10-25 DIAGNOSIS — B370 Candidal stomatitis: Secondary | ICD-10-CM | POA: Diagnosis present

## 2015-10-25 DIAGNOSIS — I110 Hypertensive heart disease with heart failure: Secondary | ICD-10-CM | POA: Diagnosis present

## 2015-10-25 DIAGNOSIS — F10239 Alcohol dependence with withdrawal, unspecified: Secondary | ICD-10-CM | POA: Diagnosis present

## 2015-10-25 DIAGNOSIS — F10232 Alcohol dependence with withdrawal with perceptual disturbance: Secondary | ICD-10-CM | POA: Diagnosis not present

## 2015-10-25 DIAGNOSIS — R109 Unspecified abdominal pain: Secondary | ICD-10-CM

## 2015-10-25 DIAGNOSIS — G4733 Obstructive sleep apnea (adult) (pediatric): Secondary | ICD-10-CM | POA: Diagnosis present

## 2015-10-25 DIAGNOSIS — E669 Obesity, unspecified: Secondary | ICD-10-CM | POA: Diagnosis present

## 2015-10-25 DIAGNOSIS — F1020 Alcohol dependence, uncomplicated: Secondary | ICD-10-CM | POA: Diagnosis not present

## 2015-10-25 DIAGNOSIS — I255 Ischemic cardiomyopathy: Secondary | ICD-10-CM | POA: Diagnosis present

## 2015-10-25 DIAGNOSIS — Z7982 Long term (current) use of aspirin: Secondary | ICD-10-CM

## 2015-10-25 DIAGNOSIS — Z7901 Long term (current) use of anticoagulants: Secondary | ICD-10-CM

## 2015-10-25 DIAGNOSIS — F10231 Alcohol dependence with withdrawal delirium: Secondary | ICD-10-CM | POA: Diagnosis present

## 2015-10-25 DIAGNOSIS — F10230 Alcohol dependence with withdrawal, uncomplicated: Secondary | ICD-10-CM

## 2015-10-25 DIAGNOSIS — F17210 Nicotine dependence, cigarettes, uncomplicated: Secondary | ICD-10-CM | POA: Diagnosis present

## 2015-10-25 DIAGNOSIS — Z86711 Personal history of pulmonary embolism: Secondary | ICD-10-CM | POA: Diagnosis not present

## 2015-10-25 DIAGNOSIS — I2510 Atherosclerotic heart disease of native coronary artery without angina pectoris: Secondary | ICD-10-CM | POA: Diagnosis present

## 2015-10-25 LAB — BASIC METABOLIC PANEL
Anion gap: 14 (ref 5–15)
BUN: 6 mg/dL (ref 6–20)
CHLORIDE: 101 mmol/L (ref 101–111)
CO2: 20 mmol/L — ABNORMAL LOW (ref 22–32)
CREATININE: 0.89 mg/dL (ref 0.61–1.24)
Calcium: 8.7 mg/dL — ABNORMAL LOW (ref 8.9–10.3)
GFR calc Af Amer: >60 mL/min (ref >60)
GFR calc non Af Amer: >60 mL/min (ref >60)
GLUCOSE: 107 mg/dL — AB (ref 65–99)
POTASSIUM: 3.8 mmol/L (ref 3.5–5.1)
Sodium: 135 mmol/L (ref 135–145)

## 2015-10-25 LAB — HEPATIC FUNCTION PANEL
ALBUMIN: 2.6 g/dL — AB (ref 3.5–5.0)
ALT: 46 U/L (ref 17–63)
AST: 57 U/L — ABNORMAL HIGH (ref 15–41)
Alkaline Phosphatase: 89 U/L (ref 38–126)
BILIRUBIN INDIRECT: 0.5 mg/dL (ref 0.3–0.9)
Bilirubin, Direct: 0.5 mg/dL (ref 0.1–0.5)
TOTAL PROTEIN: 5.7 g/dL — AB (ref 6.5–8.1)
Total Bilirubin: 1 mg/dL (ref 0.3–1.2)

## 2015-10-25 LAB — RAPID URINE DRUG SCREEN, HOSP PERFORMED
AMPHETAMINES: NOT DETECTED
BARBITURATES: NOT DETECTED
BENZODIAZEPINES: NOT DETECTED
COCAINE: NOT DETECTED
Opiates: NOT DETECTED
TETRAHYDROCANNABINOL: POSITIVE — AB

## 2015-10-25 LAB — URINALYSIS, ROUTINE W REFLEX MICROSCOPIC
GLUCOSE, UA: NEGATIVE mg/dL
HGB URINE DIPSTICK: NEGATIVE
Ketones, ur: NEGATIVE mg/dL
Nitrite: NEGATIVE
PH: 5.5 (ref 5.0–8.0)
PROTEIN: NEGATIVE mg/dL
Specific Gravity, Urine: 1.01 (ref 1.005–1.030)

## 2015-10-25 LAB — TROPONIN I: Troponin I: <0.03 ng/mL (ref <0.031)

## 2015-10-25 LAB — LIPASE, BLOOD: LIPASE: 34 U/L (ref 11–51)

## 2015-10-25 LAB — CBC
HEMATOCRIT: 37.3 % — AB (ref 39.0–52.0)
Hemoglobin: 12.6 g/dL — ABNORMAL LOW (ref 13.0–17.0)
MCH: 32.6 pg (ref 26.0–34.0)
MCHC: 33.8 g/dL (ref 30.0–36.0)
MCV: 96.6 fL (ref 78.0–100.0)
PLATELETS: 189 10*3/uL (ref 150–400)
RBC: 3.86 MIL/uL — ABNORMAL LOW (ref 4.22–5.81)
RDW: 15 % (ref 11.5–15.5)
WBC: 7.5 10*3/uL (ref 4.0–10.5)

## 2015-10-25 LAB — URINE MICROSCOPIC-ADD ON: RBC / HPF: NONE SEEN RBC/hpf (ref 0–5)

## 2015-10-25 LAB — MAGNESIUM: Magnesium: 1.1 mg/dL — ABNORMAL LOW (ref 1.7–2.4)

## 2015-10-25 LAB — AMMONIA: Ammonia: 41 umol/L — ABNORMAL HIGH (ref 9–35)

## 2015-10-25 LAB — D-DIMER, QUANTITATIVE (NOT AT ARMC): D DIMER QUANT: 0.34 ug{FEU}/mL (ref 0.00–0.50)

## 2015-10-25 LAB — MRSA PCR SCREENING: MRSA BY PCR: NEGATIVE

## 2015-10-25 LAB — I-STAT TROPONIN, ED: Troponin i, poc: 0 ng/mL (ref 0.00–0.08)

## 2015-10-25 LAB — ETHANOL: Alcohol, Ethyl (B): 71 mg/dL — ABNORMAL HIGH (ref <5)

## 2015-10-25 LAB — BRAIN NATRIURETIC PEPTIDE: B Natriuretic Peptide: 47 pg/mL (ref 0.0–100.0)

## 2015-10-25 MED ORDER — NITROGLYCERIN 2 % TD OINT
1.0000 [in_us] | TOPICAL_OINTMENT | Freq: Once | TRANSDERMAL | Status: AC
  Administered 2015-10-25: 1 [in_us] via TOPICAL
  Filled 2015-10-25: qty 1

## 2015-10-25 MED ORDER — METOPROLOL TARTRATE 1 MG/ML IV SOLN
5.0000 mg | Freq: Four times a day (QID) | INTRAVENOUS | Status: DC | PRN
  Administered 2015-10-25 – 2015-10-26 (×4): 5 mg via INTRAVENOUS
  Filled 2015-10-25 (×5): qty 5

## 2015-10-25 MED ORDER — SODIUM CHLORIDE 0.9 % IV BOLUS (SEPSIS)
1000.0000 mL | Freq: Once | INTRAVENOUS | Status: AC
  Administered 2015-10-25: 1000 mL via INTRAVENOUS

## 2015-10-25 MED ORDER — NAPHAZOLINE HCL 0.1 % OP SOLN: 1.0000 [drp] | Freq: Four times a day (QID) | OPHTHALMIC | Status: DC | PRN

## 2015-10-25 MED ORDER — VITAMIN D 1000 UNITS PO TABS
1000.0000 [IU] | ORAL_TABLET | Freq: Every day | ORAL | Status: DC
  Administered 2015-10-26 – 2015-10-30 (×4): 1000 [IU] via ORAL
  Filled 2015-10-25 (×4): qty 1

## 2015-10-25 MED ORDER — LORAZEPAM 2 MG/ML IJ SOLN: 0.0000 mg | Freq: Two times a day (BID) | INTRAMUSCULAR | Status: DC

## 2015-10-25 MED ORDER — NITROGLYCERIN 0.4 MG SL SUBL: 0.4000 mg | SUBLINGUAL_TABLET | SUBLINGUAL | Status: DC | PRN

## 2015-10-25 MED ORDER — RIVAROXABAN 20 MG PO TABS
20.0000 mg | ORAL_TABLET | Freq: Every day | ORAL | Status: DC
  Administered 2015-10-25 – 2015-10-29 (×3): 20 mg via ORAL
  Filled 2015-10-25 (×7): qty 1

## 2015-10-25 MED ORDER — ONDANSETRON HCL 4 MG/2ML IJ SOLN
4.0000 mg | Freq: Four times a day (QID) | INTRAMUSCULAR | Status: DC | PRN
  Administered 2015-10-25: 4 mg via INTRAVENOUS
  Filled 2015-10-25: qty 2

## 2015-10-25 MED ORDER — FLUTICASONE PROPIONATE 50 MCG/ACT NA SUSP
1.0000 | Freq: Every day | NASAL | Status: DC
  Administered 2015-10-26 – 2015-10-30 (×4): 1 via NASAL
  Filled 2015-10-25: qty 16

## 2015-10-25 MED ORDER — BISOPROLOL FUMARATE 5 MG PO TABS
5.0000 mg | ORAL_TABLET | Freq: Two times a day (BID) | ORAL | Status: DC
  Administered 2015-10-25 – 2015-10-29 (×6): 5 mg via ORAL
  Filled 2015-10-25 (×7): qty 1

## 2015-10-25 MED ORDER — FOLIC ACID 1 MG PO TABS: 1.0000 mg | ORAL_TABLET | ORAL | Status: DC

## 2015-10-25 MED ORDER — THIAMINE HCL 100 MG/ML IJ SOLN
100.0000 mg | Freq: Every day | INTRAMUSCULAR | Status: DC
  Administered 2015-10-25 – 2015-10-28 (×3): 100 mg via INTRAVENOUS
  Filled 2015-10-25 (×2): qty 2

## 2015-10-25 MED ORDER — PRAVASTATIN SODIUM 20 MG PO TABS
20.0000 mg | ORAL_TABLET | Freq: Every day | ORAL | Status: DC
  Administered 2015-10-25 – 2015-10-29 (×3): 20 mg via ORAL
  Filled 2015-10-25 (×3): qty 1

## 2015-10-25 MED ORDER — LORAZEPAM 2 MG/ML IJ SOLN
2.0000 mg | Freq: Once | INTRAMUSCULAR | Status: AC
  Administered 2015-10-25: 2 mg via INTRAVENOUS
  Filled 2015-10-25: qty 1

## 2015-10-25 MED ORDER — GI COCKTAIL ~~LOC~~: 30.0000 mL | Freq: Four times a day (QID) | ORAL | Status: DC | PRN

## 2015-10-25 MED ORDER — PANTOPRAZOLE SODIUM 40 MG IV SOLR: 40.0000 mg | Freq: Every day | INTRAVENOUS | Status: DC

## 2015-10-25 MED ORDER — ACETAMINOPHEN 325 MG PO TABS
650.0000 mg | ORAL_TABLET | ORAL | Status: DC | PRN
  Administered 2015-10-28 – 2015-10-29 (×2): 650 mg via ORAL
  Filled 2015-10-25 (×2): qty 2

## 2015-10-25 MED ORDER — LORAZEPAM 2 MG/ML IJ SOLN
2.0000 mg | INTRAMUSCULAR | Status: DC | PRN
Start: 2015-10-25 — End: 2015-10-26
  Administered 2015-10-25: 3 mg via INTRAVENOUS
  Administered 2015-10-25 – 2015-10-26 (×6): 2 mg via INTRAVENOUS
  Filled 2015-10-25: qty 2
  Filled 2015-10-25 (×5): qty 1

## 2015-10-25 MED ORDER — ASPIRIN 81 MG PO CHEW
325.0000 mg | CHEWABLE_TABLET | ORAL | Status: DC
  Filled 2015-10-25 (×2): qty 5

## 2015-10-25 MED ORDER — LORAZEPAM 2 MG/ML IJ SOLN
INTRAMUSCULAR | Status: AC
  Administered 2015-10-25: 2 mg
  Filled 2015-10-25: qty 1

## 2015-10-25 MED ORDER — POLYETHYLENE GLYCOL 3350 17 G PO PACK
17.0000 g | PACK | Freq: Two times a day (BID) | ORAL | Status: DC
  Administered 2015-10-25 – 2015-10-30 (×7): 17 g via ORAL
  Filled 2015-10-25 (×8): qty 1

## 2015-10-25 MED ORDER — NICOTINE 21 MG/24HR TD PT24
21.0000 mg | MEDICATED_PATCH | Freq: Every day | TRANSDERMAL | Status: DC
  Administered 2015-10-25 – 2015-10-30 (×6): 21 mg via TRANSDERMAL
  Filled 2015-10-25 (×5): qty 1

## 2015-10-25 MED ORDER — PANTOPRAZOLE SODIUM 40 MG PO TBEC
40.0000 mg | DELAYED_RELEASE_TABLET | Freq: Every day | ORAL | Status: DC
  Administered 2015-10-25 – 2015-10-30 (×5): 40 mg via ORAL
  Filled 2015-10-25 (×5): qty 1

## 2015-10-25 MED ORDER — FOLIC ACID 5 MG/ML IJ SOLN
1.0000 mg | Freq: Every day | INTRAMUSCULAR | Status: DC
  Administered 2015-10-25: 1 mg via INTRAVENOUS
  Filled 2015-10-25 (×3): qty 0.2

## 2015-10-25 MED ORDER — BISACODYL 10 MG RE SUPP
10.0000 mg | Freq: Once | RECTAL | Status: AC
  Administered 2015-10-25: 10 mg via RECTAL
  Filled 2015-10-25: qty 1

## 2015-10-25 MED ORDER — ENALAPRIL MALEATE 10 MG PO TABS
10.0000 mg | ORAL_TABLET | Freq: Every day | ORAL | Status: DC
  Administered 2015-10-26 – 2015-10-30 (×4): 10 mg via ORAL
  Filled 2015-10-25 (×5): qty 1

## 2015-10-25 MED ORDER — POTASSIUM CHLORIDE IN NACL 20-0.9 MEQ/L-% IV SOLN
INTRAVENOUS | Status: AC
  Administered 2015-10-25: 100 mL/h via INTRAVENOUS
  Filled 2015-10-25: qty 1000

## 2015-10-25 MED ORDER — LORAZEPAM 2 MG/ML IJ SOLN
0.0000 mg | Freq: Four times a day (QID) | INTRAMUSCULAR | Status: DC
Start: 2015-10-25 — End: 2015-10-25

## 2015-10-25 NOTE — ED Notes (Signed)
Attempted to call report

## 2015-10-25 NOTE — Telephone Encounter (Signed)
Patient walked into office this afternoon complaining of chest pain. No acute abnormalities showing on EKG. Patient just at ED on Friday secondary to requesting  inpatient alcohol rehab -  but has not been enrolled. Chest pain is not new, this has been occurring for weeks now.  Nausea/Vomiting is also been occuring for several weeks. Ambulance called to escort patient to hospital to be evaluated.

## 2015-10-25 NOTE — ED Provider Notes (Signed)
CSN: [id number]     Arrival date & time 10/25/15  1405 History   First MD Initiated Contact with Patient 10/25/15 1415     Chief Complaint  Patient presents with  . Chest Pain     (Consider location/radiation/quality/duration/timing/severity/associated sxs/prior Treatment) HPI   Blood pressure 125/97, pulse 133, temperature 98.1 F (36.7 C), temperature source Oral, resp. rate 19, height 6' (1.829 m), weight 97.07 kg, SpO2 99 %.  Stephen [name] is a 54 y.o. male with past medical history significant for hypertension, hyperlipidemia, tobacco abuse, CAD, defibrillator complaining of acute retrosternal sharp chest pain onset 45 minutes prior to arrival. Associated with shortness of breath and lightheadedness, no actual syncope, states that this is consistent with prior heart attack. 10/10 at worst 7/10 now. Patient was brought in by EMS was given full dose aspirin and nitroglycerin in route, states that nitroglycerin did ease the pain. Patient is poor historian, states he takes Xarelto for unknown region, chart review shows that he has a history of PE, this patient states that he's been compliant with his Xarelto. He denies recent immobilization/surgeries/leg swelling or calf pain. Patient is alcoholic, last drank last night. No history of DTs. He has a recent visit requesting detox however, patient has not been able to secure outpatient detox program. On review of systems he denies fever, chills, abdominal pain, nausea, vomiting. Endorses dry chronic cough. As per his friend who is in the room with him he's also been more confused than normal. States that he had an appointment which she thought was at his primary care physician in [place] and he showed up at his cardiologist in [place] today.  Cardiology: Caryl Comes PCP: Renaye Rakers, [place])  Past Medical History  Diagnosis Date  . Obesity   . Hypertension   . Hyperlipidemia   . Systolic heart failure   . Coronary artery disease     native  vessel  . AICD (automatic cardioverter/defibrillator) present 2006    [place] 7232  . Ischemic cardiomyopathy     ischemic  . OSA (obstructive sleep apnea)   . [place] Paroxysmal ventricular tachycardia ([place])     Rx via ATP 2008  . Pulmonary embolism ([place])   . MI, old    Past Surgical History  Procedure Laterality Date  . Cardiac defibrillator placement  2006    [employer]tronic Maximo 7232  . Knee surgery      left   . Implantable cardioverter defibrillator generator change N/A 09/02/2014    Procedure: IMPLANTABLE CARDIOVERTER DEFIBRILLATOR GENERATOR CHANGE;  Surgeon: Deboraha Sprang, MD;  Location: [facility] CATH LAB;  Service: Cardiovascular;  Laterality: N/A;  . Lead revision N/A 09/02/2014    Procedure: LEAD REVISION;  Surgeon: Deboraha Sprang, MD;  Location: [facility] CATH LAB;  Service: Cardiovascular;  Laterality: N/A;   No family history on file. Social History  Substance Use Topics  . Smoking status: Current Every Day Smoker -- 0.50 packs/day    Types: Cigarettes  . Smokeless tobacco: Never Used  . Alcohol Use: Yes     Comment: at least a pint plus every day    Review of Systems  10 systems reviewed and found to be negative, except as noted in the HPI.   Allergies  Haldol  Home Medications   Prior to Admission medications   Medication Sig Start Date End Date Taking? Authorizing Provider  aspirin 81 MG EC tablet Take 81 mg by mouth every other day.  07/15/14  Yes  Eileen Stanford, PA-C  bisoprolol (ZEBETA) 5 MG tablet Take 5 mg by mouth 2 (two) times daily.   Yes Historical Provider, MD  cholecalciferol (VITAMIN D) 1000 UNITS tablet Take 1,000 Units by mouth daily.    Yes Historical Provider, MD  enalapril (VASOTEC) 10 MG tablet Take 10 mg by mouth daily.    Yes Historical Provider, MD  folic acid (FOLVITE) 1 MG tablet Take 1 mg by mouth every other day.  11/17/13  Yes Historical Provider, MD  furosemide (LASIX) 40 MG tablet Take 40 mg by mouth daily.   Yes  Historical Provider, MD  KLOR-CON M20 20 MEQ tablet Take 20 mEq by mouth daily. 11/17/13  Yes Historical Provider, MD  mometasone (NASONEX) 50 MCG/ACT nasal spray Place 2 sprays into the nose daily as needed. For nasal congestion   Yes Historical Provider, MD  nitroGLYCERIN (NITROSTAT) 0.4 MG SL tablet Place 0.4 mg under the tongue every 5 (five) minutes as needed for chest pain.   Yes Historical Provider, MD  omeprazole (PRILOSEC) 40 MG capsule Take 40 mg by mouth daily.   Yes Historical Provider, MD  pravastatin (PRAVACHOL) 20 MG tablet Take 20 mg by mouth daily.  05/18/15  Yes Historical Provider, MD  rivaroxaban (XARELTO) 20 MG TABS tablet Take 20 mg by mouth daily with supper.   Yes Carole Civil, MD  spironolactone (ALDACTONE) 25 MG tablet Take 1 tablet (25 mg total) by mouth daily. 09/03/14  Yes Rogelia Mire, NP  tetrahydrozoline-zinc (VISINE-AC) 0.05-0.25 % ophthalmic solution Place 2 drops into both eyes 3 (three) times daily as needed (redness relief).   Yes Historical Provider, MD   BP 145/117 mmHg  Pulse 127  Temp(Src) 98.1 F (36.7 C) (Oral)  Resp 23  Ht 6' (1.829 m)  Wt 97.07 kg  BMI 29.02 kg/m2  SpO2 97% Physical Exam  Constitutional: He is oriented to person, place, and time. He appears well-developed and well-nourished. No distress.  HENT:  Head: Normocephalic.  Dry mucous membranes  Mild conjunctival pallor  Eyes: Conjunctivae are normal. Pupils are equal, round, and reactive to light. No scleral icterus.  Neck: Normal range of motion. No JVD present. No tracheal deviation present.  Cardiovascular: Regular rhythm and intact distal pulses.   Moderate tachycardia between 120s and 140s.   Radial pulse equal bilaterally  Pulmonary/Chest: Effort normal and breath sounds normal. No stridor. No respiratory distress. He has no wheezes. He has no rales. He exhibits no tenderness.  Pacer to left chest  Abdominal: Soft. Bowel sounds are normal. He exhibits no  distension and no mass. There is no tenderness. There is no rebound and no guarding.  Genitourinary:  Urine is extremely dark  Musculoskeletal: Normal range of motion. He exhibits no edema or tenderness.  No calf asymmetry, superficial collaterals, palpable cords, edema, Homans sign negative bilaterally.    Neurological: He is alert and oriented to person, place, and time.  Positive tongue fasciculations, positive bilateral hand tremors  Skin: He is not diaphoretic.  Psychiatric:  Agitated, Labile Mood  Nursing note and vitals reviewed.   ED Course  Procedures (including critical care time) Labs Review Labs Reviewed  BASIC METABOLIC PANEL - Abnormal; Notable for the following:    CO2 20 (*)    Glucose, Bld 107 (*)    Calcium 8.7 (*)    All other components within normal limits  CBC - Abnormal; Notable for the following:    RBC 3.86 (*)    Hemoglobin 12.6 (*)  HCT 37.3 (*)    All other components within normal limits  AMMONIA - Abnormal; Notable for the following:    Ammonia 41 (*)    All other components within normal limits  ETHANOL - Abnormal; Notable for the following:    Alcohol, Ethyl (B) 71 (*)    All other components within normal limits  URINE RAPID DRUG SCREEN, HOSP PERFORMED - Abnormal; Notable for the following:    Tetrahydrocannabinol POSITIVE (*)    All other components within normal limits  URINALYSIS, ROUTINE W REFLEX MICROSCOPIC (NOT AT [facility]) - Abnormal; Notable for the following:    Color, Urine AMBER (*)    APPearance HAZY (*)    Bilirubin Urine SMALL (*)    Leukocytes, UA TRACE (*)    All other components within normal limits  URINE MICROSCOPIC-ADD ON - Abnormal; Notable for the following:    Squamous Epithelial / LPF 0-5 (*)    Bacteria, UA FEW (*)    Casts HYALINE CASTS (*)    All other components within normal limits  D-DIMER, QUANTITATIVE (NOT AT [facility])  BRAIN NATRIURETIC PEPTIDE  I-STAT TROPOININ, ED    Imaging Review Dg Chest Portable 1  View  10/25/2015  CLINICAL DATA:  Chronic chest pain for several weeks EXAM: PORTABLE CHEST 1 VIEW COMPARISON:  10/15/2015 FINDINGS: Heart size upper normal. Stable 2 lead cardiac pacer. Vascular pattern normal. Lungs clear. No pleural effusions. IMPRESSION: No active disease. Electronically Signed   By: Skipper Cliche M.D.   On: 10/25/2015 14:28   I have personally reviewed and evaluated these images and lab results as part of my medical decision-making.   EKG Interpretation   Date/Time:  Monday October 25 2015 14:07:35 EST Ventricular Rate:  139 PR Interval:  130 QRS Duration: 95 QT Interval:  295 QTC Calculation: 449 R Axis:   23 Text Interpretation:  Sinus tachycardia Inferior infarct, old Abnormal  lateral Q waves no significant change since Oct 21 2015 Confirmed by  Regenia Skeeter  MD, SCOTT [phone]) on 10/25/2015 2:14:44 PM      MDM   Final diagnoses:  Chest pain, unspecified chest pain type  Alcohol withdrawal, uncomplicated ([place])    Filed Vitals:   10/25/15 1415 10/25/15 1448 10/25/15 1515 10/25/15 1545  BP: 125/97 130/99 156/110 145/117  Pulse: 133 137 124 127  Temp:      TempSrc:      Resp: 19 22 16 23   Height:      Weight:      SpO2: 99% 96% 99% 97%    Medications  LORazepam (ATIVAN) injection 0-4 mg (not administered)    Followed by  LORazepam (ATIVAN) injection 0-4 mg (not administered)  nicotine (NICODERM CQ - dosed in mg/24 hours) patch 21 mg (21 mg Transdermal Patch Applied 10/25/15 1511)  sodium chloride 0.9 % bolus 1,000 mL (1,000 mLs Intravenous New Bag/Given 10/25/15 1443)  LORazepam (ATIVAN) injection 2 mg (2 mg Intravenous Given 10/25/15 1443)  nitroGLYCERIN (NITROGLYN) 2 % ointment 1 inch (1 inch Topical Given 10/25/15 1511)  LORazepam (ATIVAN) injection 2 mg (2 mg Intravenous Given 10/25/15 1551)    Stephen [name] is 54 y.o. male presenting with sharp retrosternal chest pain with shortness of breath and lightheadedness, patient states this is  consistent with prior heart attacks, however, he also has a history of PE, he states he's been compliant with his Xarelto however he is an alcoholic and I'm unclear if this is true. Will dimer him, cardiac workup, and hydrate with  IVF. Patient appears to be in acute withdrawal. I think this is likely a stress to his artery weakened heart. Had improvement with nitroglycerin via EMS in route. Patient will be given nitroglycerin paste, the other thing I think will help his chest pain is Ativan and fluids. Will interrogate his pacemaker.   Patient states he may have an allergy to a combination of Ativan and Haldol which she was given for a cardiac procedure in the past. I think this is confusion on the patient's part. Will give a trial of 2 mg of Ativan and evaluate for allergic reaction.   Patient reports significant improvement in agitation with Ativan, removed this from his allergy list. Troponin negative, d-dimer negative, chest x-ray clear. Patient will need admission for alcohol withdrawal and to cycle troponins.  Pacer interrogation reported to Dr. Regenia Skeeter: No abnormalities since December 4.  Unassigned admission to [facility] Dr. Barbaraann Faster, case d/w PA [facility], PA-C 10/25/15 1608  Sherwood Gambler, MD 10/29/15 [phone]

## 2015-10-25 NOTE — H&P (Signed)
[facility] History and Physical                                                                                    Stephen [name], is a 54 y.o. male  MRN: [id number]   DOB - Jun 10, 1961  Admit Date - 10/25/2015  Outpatient Primary MD for the patient is Stephen Mustache, MD  Referring Physician:  Monico Blitz, PA-C  Chief Complaint:   Chief Complaint  Patient presents with  . Chest Pain     HPI  Stephen [name]  is a 54 y.o. 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  He was sent to the emergency department today for evaluation from his cardiologist's office. He had an appointment with his primary care physician in [place] but due to confusion went to see [name] heart care. He complained about chest pain and nausea. And EKG was performed and showed no acute abnormalities. He was then sent to the ER by ambulance. The patient reports substernal sharp chest pain that is intermittent in nature, a couple of minutes in duration, and is relieved by nitroglycerin. Per the [name] note this is been going on for weeks. The patient also reports nausea and vomiting for several weeks. He presented to [name] on 12/15 requesting admission to alcohol rehab.  He was discharged to home after that visit.  In the ER he is tachycardic up to 138. He is very tremulous, confused, and having occasional myoclonic jerks. His alcohol level was 71.  His ammonia level is 41.  His D dimer 0.34. EKG shows sinus tach at 139. Chest x-ray shows no acute abnormalities. We will admit the patient to a step down bed for alcohol withdrawal.    It is difficult to obtain a review of systems from this patient as he is confused. However, Review of Systems  Constitutional: Positive for weight loss and malaise/fatigue.  HENT: Negative.   Eyes: Negative.   Respiratory: Negative.    Cardiovascular: Positive for chest pain.  Gastrointestinal: Positive for heartburn, nausea, vomiting, abdominal pain and constipation.  Skin: Negative.      Past Medical History  Past Medical History  Diagnosis Date  . Obesity   . Hypertension   . Hyperlipidemia   . Systolic heart failure   . Coronary artery disease     native vessel  . AICD (automatic cardioverter/defibrillator) present 2006    [name]  . Ischemic cardiomyopathy     ischemic  . OSA (obstructive sleep apnea)   . Vesper Paroxysmal ventricular tachycardia ([place])     Rx via [facility] 2008  . Pulmonary embolism ([place])   . MI, old     Past Surgical History  Procedure Laterality Date  . Cardiac defibrillator placement  2006    [name]  . Knee surgery      left   . Implantable cardioverter defibrillator generator change N/A 09/02/2014    Procedure: IMPLANTABLE CARDIOVERTER DEFIBRILLATOR GENERATOR CHANGE;  Surgeon: Stephen Sprang, MD;  Location: [name];  Service: Cardiovascular;  Laterality: N/A;  .  Lead revision N/A 09/02/2014    Procedure: LEAD REVISION;  Surgeon: Stephen Sprang, MD;  Location: [name];  Service: Cardiovascular;  Laterality: N/A;      Social History Social History  Substance Use Topics  . Smoking status: Current Every Day Smoker -- 0.50 packs/day    Types: Cigarettes  . Smokeless tobacco: Never Used  . Alcohol Use: Yes     Comment: at least a pint plus every day   uses marijuana occasionally. Used to work for the [employer] as an Chief Financial Officer.  Family History Alcoholism.  Prior to Admission medications   Medication Sig Start Date End Date Taking? Authorizing Provider  aspirin 81 MG EC tablet Take 81 mg by mouth every other day.  07/15/14  Yes Stephen Stanford, PA-C  bisoprolol (ZEBETA) 5 MG tablet Take 5 mg by mouth 2 (two) times daily.   Yes Historical Provider, MD  cholecalciferol (VITAMIN D) 1000 UNITS tablet Take 1,000 Units  by mouth daily.    Yes Historical Provider, MD  enalapril (VASOTEC) 10 MG tablet Take 10 mg by mouth daily.    Yes Historical Provider, MD  folic acid (FOLVITE) 1 MG tablet Take 1 mg by mouth every other day.  11/17/13  Yes Historical Provider, MD  furosemide (LASIX) 40 MG tablet Take 40 mg by mouth daily.   Yes Historical Provider, MD  KLOR-CON M20 20 MEQ tablet Take 20 mEq by mouth daily. 11/17/13  Yes Historical Provider, MD  mometasone (NASONEX) 50 MCG/ACT nasal spray Place 2 sprays into the nose daily as needed. For nasal congestion   Yes Historical Provider, MD  nitroGLYCERIN (NITROSTAT) 0.4 MG SL tablet Place 0.4 mg under the tongue every 5 (five) minutes as needed for chest pain.   Yes Historical Provider, MD  omeprazole (PRILOSEC) 40 MG capsule Take 40 mg by mouth daily.   Yes Historical Provider, MD  pravastatin (PRAVACHOL) 20 MG tablet Take 20 mg by mouth daily.  05/18/15  Yes Historical Provider, MD  rivaroxaban (XARELTO) 20 MG TABS tablet Take 20 mg by mouth daily with supper.   Yes Stephen Civil, MD  spironolactone (ALDACTONE) 25 MG tablet Take 1 tablet (25 mg total) by mouth daily. 09/03/14  Yes Stephen Mire, NP  tetrahydrozoline-zinc (VISINE-AC) 0.05-0.25 % ophthalmic solution Place 2 drops into both eyes 3 (three) times daily as needed (redness relief).   Yes Historical Provider, MD    Allergies  Allergen Reactions  . Haldol [Haloperidol Lactate]     Went crazy, rash    Physical Exam  Vitals  Blood pressure 126/77, pulse 124, temperature 98.1 F (36.7 C), temperature source Oral, resp. rate 19, height 6' (1.829 m), weight 97.07 kg (214 lb), SpO2 98 %.   General: tremulous male lying in bed having occasional myoclonic jerks. Brother bedside  Psych:  awake but quickly falls asleep, cooperative. Difficult to assess mood given confusion.  Neuro:   bilateral tremors in arms and hands, when awake he is able to follow commands, no other obvious focal neuro  deficits  ENT:  Ears and Eyes appear Normal, Conjunctivae clear, PER. Moist oral mucosa without erythema or exudates.  Neck:  Supple, No lymphadenopathy appreciated  Respiratory:  Symmetrical chest wall movement, Good air movement bilaterally, CTAB.  Cardiac: tachycardic, No Murmurs, no LE edema noted, no JVD.    Abdomen:  Positive bowel sounds, Soft, tender to palpation in the right lower quadrant, Non distended,  No masses appreciated  Skin:  No Cyanosis, Normal Skin Turgor, No Skin Rash or Bruise.  Extremities:  Able to move all 4. 5/5 strength in each,  no effusions.  Data Review  Wt Readings from Last 3 Encounters:  10/25/15 97.07 kg (214 lb)  10/15/15 97.07 kg (214 lb)  06/15/15 111.313 kg (245 lb 6.4 oz)    CBC  Recent Labs [name] 10/25/15 1440  WBC 7.5  HGB 12.6*  HCT 37.3*  PLT 189  MCV 96.6  MCH 32.6  MCHC 33.8  RDW 15.0    Chemistries   Recent Labs [name] 10/25/15 1440  NA 135  K 3.8  CL 101  CO2 20*  GLUCOSE 107*  BUN 6  CREATININE 0.89  CALCIUM 8.7*     CREATININE: 0.89 (10/25/15 1440) Estimated creatinine clearance - 113.3 mL/min    Recent Labs  10/25/15 1452  DDIMER 0.34     Urinalysis    Component Value Date/Time   COLORURINE AMBER* 10/25/2015 1450   APPEARANCEUR HAZY* 10/25/2015 1450   LABSPEC 1.010 10/25/2015 1450   PHURINE 5.5 10/25/2015 1450   GLUCOSEU NEGATIVE 10/25/2015 1450   HGBUR NEGATIVE 10/25/2015 1450   BILIRUBINUR SMALL* 10/25/2015 1450   KETONESUR NEGATIVE 10/25/2015 1450   PROTEINUR NEGATIVE 10/25/2015 1450   NITRITE NEGATIVE 10/25/2015 1450   LEUKOCYTESUR TRACE* 10/25/2015 1450    Imaging results:   Dg Chest 2 View  10/15/2015  CLINICAL DATA:  Cough, dyspnea EXAM: CHEST  2 VIEW COMPARISON:  09/03/2014 FINDINGS: Cardiomediastinal silhouette is stable. 2 leads cardiac pacemaker is unchanged in position. No acute infiltrate or pleural effusion. No pulmonary edema. Mild degenerative changes thoracic spine.  IMPRESSION: No active cardiopulmonary disease. Electronically Signed   By: Lahoma Crocker M.D.   On: 10/15/2015 19:38   Ct Abdomen W Contrast  10/06/2015  CLINICAL DATA:  Mid to upper abdominal pain for 3-4 weeks. EXAM: CT ABDOMEN WITH CONTRAST TECHNIQUE: Multidetector CT imaging of the abdomen was performed using the standard protocol following bolus administration of intravenous contrast. CONTRAST:  22mL OMNIPAQUE IOHEXOL 350 MG/ML SOLN COMPARISON:  03/07/2005. FINDINGS: Lower chest: Lung bases show no acute findings. Heart size normal. Coronary artery calcification. No pericardial or pleural effusion. Hepatobiliary: Liver is unremarkable. Granular stones are seen in the gallbladder. No biliary ductal dilatation. Pancreas: Negative. Spleen: Negative. Adrenals/Urinary Tract: Adrenal glands and kidneys are unremarkable. Visualized portions of the ureters are decompressed. Stomach/Bowel: Stomach and visualized portions of the small bowel are unremarkable. Stool is seen in the ascending colon. Vascular/Lymphatic: Vascular structures are unremarkable. No pathologically enlarged lymph nodes. Other: Mesenteries and peritoneum are unremarkable.  No free fluid. Musculoskeletal: Not worrisome lytic or sclerotic lesions. Degenerative changes are seen in the spine. IMPRESSION: 1. Possible mild constipation. 2. Cholelithiasis. 3. Coronary artery calcification. Electronically Signed   By: Lorin Picket M.D.   On: 10/06/2015 16:46   Dg Chest Portable 1 View  10/25/2015  CLINICAL DATA:  Chronic chest pain for several weeks EXAM: PORTABLE CHEST 1 VIEW COMPARISON:  10/15/2015 FINDINGS: Heart size upper normal. Stable 2 lead cardiac pacer. Vascular pattern normal. Lungs clear. No pleural effusions. IMPRESSION: No active disease. Electronically Signed   By: Skipper Cliche M.D.   On: 10/25/2015 14:28    My personal review of EKG: NSR, No ST changes noted.   Assessment & Plan  Principal Problem:   Alcohol withdrawal  ([place]) Active Problems:   Essential hypertension   SYSTOLIC HEART FAILURE, CHRONIC   Cardiac defibrillator-Stephen-single-chamber   Cardiomyopathy, ischemic   Chest pain  Abdominal pain   Constipation   Alcohol withdraw Serum alcohol level is 71. Patient is accustomed to drinking 1 pint of whiskey every evening. Very tremulous. Tachycardic in the 120s to 130s. Intermittently confused. Cooperative. Will place and stepdown. On stepdown CIWA protocol. Social work consult requested for alcohol abuse.  Gentle IVF. Patient and brother Coralyn Mark states that he has been trying to get into an alcohol rehabilitation program outpatient for one month.   Atypical Chest pain Hx of Ischemia Cardiomyopathy with and ICD in place. Likely due to alcohol withdraw.  Sharp and intermittent in nature. Substernal location. Minutes in duration.  Eased with nitro. Per Cardiology office note:  "Chest pain is not new, this has been occurring for weeks now. Nausea/Vomiting is also been occuring for several weeks"  Will place on 325 mg aspirin (he is already on Xeralto) and cycle troponin.    History of chronic systolic heart failure. Does not appear volume overloaded but rather mildly dehydrated.  Holding lasix and spironolactone. Will continue bisoprolol.  2D echo from  08/21/12 shows LVEF of 40 - 45% and grade 2 diastolic dysfunction.  Will repeat 2D echo as he is complaining of CP for weeks.  Please restart diuretics as soon as appropriate.   Right Sided Abdominal Pain Tender to palpation in RLQ.  Likely due to ongoing vomiting.  Lipase minimally elevated on 12/9.  Will check and abdominal US, lipase and LFTs.   Essential Hypertension Holding lasix and spironolactone due to dehydration.  Continue bisoprolol, enalapril.   Hx of PE Continue Xeralto.   Weight loss Per [name] records, patient appears 30 lbs down since 8/16.  Nutrition consult.   Constipation patient reports it's been over 10 days in  since his last bowel movement. Will give ducolax suppository and then BID miralax.   Consultants Called:    None.  Called PCCM to ask [name] to monitor  Family Communication:     Brother, Coralyn Mark at bedside.  Code Status:    Full  Condition:    Appears very likely to decompensate into DTs  Potential Disposition:   Hopefully directly to an inpatient alcohol rehab at D/C  Time spent in minutes : [place],  PA-C on 10/25/2015 at 5:44 PM Between 7am to 7pm - Pager - [phone] After 7pm go to [email or web address] - password [email or web address] And look for the night coverage person covering me after hours

## 2015-10-26 ENCOUNTER — Inpatient Hospital Stay (HOSPITAL_COMMUNITY): Payer: Medicare PPO

## 2015-10-26 DIAGNOSIS — F10232 Alcohol dependence with withdrawal with perceptual disturbance: Secondary | ICD-10-CM

## 2015-10-26 DIAGNOSIS — R1011 Right upper quadrant pain: Secondary | ICD-10-CM

## 2015-10-26 DIAGNOSIS — R0789 Other chest pain: Secondary | ICD-10-CM

## 2015-10-26 LAB — TROPONIN I

## 2015-10-26 MED ORDER — LORAZEPAM 2 MG/ML IJ SOLN
0.0000 mg | Freq: Four times a day (QID) | INTRAMUSCULAR | Status: AC
  Administered 2015-10-26 (×2): 2 mg via INTRAVENOUS
  Administered 2015-10-27: 1 mg via INTRAVENOUS
  Administered 2015-10-27 (×3): 2 mg via INTRAVENOUS
  Filled 2015-10-26 (×6): qty 1

## 2015-10-26 MED ORDER — FLUCONAZOLE IN SODIUM CHLORIDE 100-0.9 MG/50ML-% IV SOLN
100.0000 mg | INTRAVENOUS | Status: DC
  Administered 2015-10-26: 100 mg via INTRAVENOUS
  Filled 2015-10-26 (×3): qty 50

## 2015-10-26 MED ORDER — FLUCONAZOLE IN SODIUM CHLORIDE 100-0.9 MG/50ML-% IV SOLN
100.0000 mg | INTRAVENOUS | Status: DC
  Filled 2015-10-26: qty 50

## 2015-10-26 MED ORDER — LORAZEPAM 1 MG PO TABS
1.0000 mg | ORAL_TABLET | Freq: Four times a day (QID) | ORAL | Status: AC | PRN
  Administered 2015-10-28: 1 mg via ORAL
  Filled 2015-10-26: qty 1

## 2015-10-26 MED ORDER — LORAZEPAM 2 MG/ML IJ SOLN
2.0000 mg | Freq: Two times a day (BID) | INTRAMUSCULAR | Status: AC
  Administered 2015-10-26 (×2): 2 mg via INTRAVENOUS
  Filled 2015-10-26: qty 1

## 2015-10-26 MED ORDER — LORAZEPAM 2 MG/ML IJ SOLN
1.0000 mg | Freq: Four times a day (QID) | INTRAMUSCULAR | Status: AC | PRN
  Administered 2015-10-26 – 2015-10-29 (×3): 1 mg via INTRAVENOUS
  Filled 2015-10-26 (×3): qty 1

## 2015-10-26 MED ORDER — THIAMINE HCL 100 MG/ML IJ SOLN
100.0000 mg | Freq: Every day | INTRAMUSCULAR | Status: DC
  Filled 2015-10-26 (×3): qty 2

## 2015-10-26 MED ORDER — ADULT MULTIVITAMIN W/MINERALS CH
1.0000 | ORAL_TABLET | Freq: Every day | ORAL | Status: DC
  Administered 2015-10-26 – 2015-10-30 (×4): 1 via ORAL
  Filled 2015-10-26 (×4): qty 1

## 2015-10-26 MED ORDER — FOLIC ACID 1 MG PO TABS
1.0000 mg | ORAL_TABLET | Freq: Every day | ORAL | Status: DC
  Administered 2015-10-26 – 2015-10-30 (×4): 1 mg via ORAL
  Filled 2015-10-26 (×4): qty 1

## 2015-10-26 MED ORDER — VITAMIN B-1 100 MG PO TABS
100.0000 mg | ORAL_TABLET | Freq: Every day | ORAL | Status: DC
  Administered 2015-10-26 – 2015-10-30 (×4): 100 mg via ORAL
  Filled 2015-10-26 (×4): qty 1

## 2015-10-26 MED ORDER — LORAZEPAM 2 MG/ML IJ SOLN
0.0000 mg | Freq: Two times a day (BID) | INTRAMUSCULAR | Status: AC
Start: 2015-10-28 — End: 2015-10-30
  Administered 2015-10-28 – 2015-10-29 (×3): 1 mg via INTRAVENOUS
  Filled 2015-10-26 (×3): qty 1

## 2015-10-26 MED ORDER — LORAZEPAM 2 MG/ML IJ SOLN: 0.5000 mg | Freq: Once | INTRAMUSCULAR | Status: DC

## 2015-10-26 MED ORDER — DEXTROSE-NACL 5-0.45 % IV SOLN
INTRAVENOUS | Status: AC
  Administered 2015-10-26: 75 mL/h via INTRAVENOUS

## 2015-10-26 MED ORDER — FLUCONAZOLE IN SODIUM CHLORIDE 100-0.9 MG/50ML-% IV SOLN
100.0000 mg | INTRAVENOUS | Status: DC
  Administered 2015-10-27 – 2015-10-30 (×4): 100 mg via INTRAVENOUS
  Filled 2015-10-26 (×5): qty 50

## 2015-10-26 NOTE — Progress Notes (Signed)
[facility] PROGRESS NOTE    Progress Note   [name] [id number] DOB: April 09, 1961 DOA: 10/25/2015 PCP: [name], MD   Brief Narrative:   [name] is an 54 y.o. male 54 y.o. 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 He was sent to the emergency department today for evaluation from his cardiologist's office. He had an appointment with his primary care physician in [place] but due to confusion went to see [facility] heart care  Assessment/Plan:   Alcohol withdrawal ([place]): CIWA continues to be elevated, continue CIWA protocol. Start on thiamine and folate. Still tachycardic and hypertensive. I will modify the Ativan protocol. We'll keep patient nothing by mouth, as he still confused. His atypical chest discomfort is not new as going back through his records from his cardiologist note relates that this pain has been going on for weeks. Continue aspirin and Xarelto, cardiac biomarkers have remained negative. Transfer to telemetry.  Essential hypertension Blood pressure seems to be elevated likely due to occult withdrawals. Continue Cipro and enalapril. Continue to hold Lasix and Aldactone. A basic metabolic panel in the morning.  SYSTOLIC HEART FAILURE, CHRONIC/Cardiomyopathy, ischemic: Previous 2-D echo showed an EF of 40% repeat a 2-D echo. I agree with holding Lasix and Aldactone. Continue enalapril and bisoprolol.  Oral thrush: Patient complaining of discomfort when swallowing, some what seems like white plaque at the back of his pharynx. Start him on Diflucan.  Cardiac defibrillator-Medtronic-single-chamber  Right-sided Abdominal pain: I cannot appreciate any right upper quadrant tenderness, AST is mildly more elevated than ALT bilirubin and alkaline phosphatase are below 1 and 90.  Constipation Start MiraLAX twice a  day.    DVT Prophylaxis - Xarelto.  Family Communication: none Disposition Plan: Patient can be transferred to telemetry. Code Status:     Code Status Orders        Start     Ordered   10/25/15 1745  Full code   Continuous     10/25/15 1744        IV Access:    Peripheral IV   Procedures and diagnostic studies:   Dg Chest Portable 1 View  10/25/2015  CLINICAL DATA:  Chronic chest pain for several weeks EXAM: PORTABLE CHEST 1 VIEW COMPARISON:  10/15/2015 FINDINGS: Heart size upper normal. Stable 2 lead cardiac pacer. Vascular pattern normal. Lungs clear. No pleural effusions. IMPRESSION: No active disease. Electronically Signed   By: Skipper Cliche M.D.   On: 10/25/2015 14:28     Medical Consultants:    None.  Anti-Infectives:   Anti-infectives    None      Subjective:    Stephen [name] he relates of some dysphagia when swallowing.  Objective:    Filed Vitals:   10/26/15 0250 10/26/15 0400 10/26/15 0530 10/26/15 0600  BP:  143/109 142/102 140/111  Pulse:  113  114  Temp: 98.1 F (36.7 C)     TempSrc: Oral     Resp:  20    Height:      Weight:      SpO2:  96%      Intake/Output Summary (Last 24 hours) at 10/26/15 0728 Last data filed at 10/26/15 0534  Gross per 24 hour  Intake 1263.34 ml  Output    300 ml  Net 963.34 ml   Filed Weights   10/25/15 1408  Weight: 97.07 kg (214 lb)    Exam: Gen:  NAD Cardiovascular:  RRR. Chest and lungs:   Good air movement and clear to auscultation. Abdomen:  Positive bowel sounds soft nontender Extremities:  No lower extremity edema.   Data Reviewed:    Labs: Basic Metabolic Panel:  Recent Labs Lab 10/25/15 1440 10/25/15 1812  NA 135  --   K 3.8  --   CL 101  --   CO2 20*  --   GLUCOSE 107*  --   BUN 6  --   CREATININE 0.89  --   CALCIUM 8.7*  --   MG  --  1.1*   GFR Estimated Creatinine Clearance: 113.3 mL/min (by C-G formula based on Cr of 0.89). Liver Function  Tests:  Recent Labs Lab 10/25/15 1812  AST 57*  ALT 46  ALKPHOS 89  BILITOT 1.0  PROT 5.7*  ALBUMIN 2.6*    Recent Labs Lab 10/25/15 1812  LIPASE 34    Recent Labs Lab 10/25/15 1457  AMMONIA 41*   Coagulation profile No results for input(s): INR, PROTIME in the last 168 hours.  CBC:  Recent Labs Lab 10/25/15 1440  WBC 7.5  HGB 12.6*  HCT 37.3*  MCV 96.6  PLT 189   Cardiac Enzymes:  Recent Labs Lab 10/25/15 1812 10/25/15 2320 10/26/15 0515  TROPONINI <0.03 <0.03 <0.03   BNP (last 3 results) No results for input(s): PROBNP in the last 8760 hours. CBG: No results for input(s): GLUCAP in the last 168 hours. D-Dimer:  Recent Labs  10/25/15 1452  DDIMER 0.34   Hgb A1c: No results for input(s): HGBA1C in the last 72 hours. Lipid Profile: No results for input(s): CHOL, HDL, LDLCALC, TRIG, CHOLHDL, LDLDIRECT in the last 72 hours. Thyroid function studies: No results for input(s): TSH, T4TOTAL, T3FREE, THYROIDAB in the last 72 hours.  Invalid input(s): FREET3 Anemia work up: No results for input(s): VITAMINB12, FOLATE, FERRITIN, TIBC, IRON, RETICCTPCT in the last 72 hours. Sepsis Labs:  Recent Labs Lab 10/25/15 1440  WBC 7.5   Microbiology Recent Results (from the past 240 hour(s))  MRSA PCR Screening     Status: None   Collection Time: 10/25/15  5:43 PM  Result Value Ref Range Status   MRSA by PCR NEGATIVE NEGATIVE Final    Comment:        The GeneXpert MRSA Assay (FDA approved for NASAL specimens only), is one component of a comprehensive MRSA colonization surveillance program. It is not intended to diagnose MRSA infection nor to guide or monitor treatment for MRSA infections.      Medications:   . [START ON 10/27/2015] aspirin  325 mg Oral QODAY  . bisoprolol  5 mg Oral BID  . cholecalciferol  1,000 Units Oral Daily  . enalapril  10 mg Oral Daily  . fluticasone  1 spray Each Nare Daily  . folic acid  1 mg Intravenous Daily   . nicotine  21 mg Transdermal Daily  . pantoprazole  40 mg Oral Daily  . polyethylene glycol  17 g Oral BID  . pravastatin  20 mg Oral q1800  . rivaroxaban  20 mg Oral Q supper  . thiamine  100 mg Intravenous Daily   Continuous Infusions:   Time spent: 25 min   LOS: 1 day   Charlynne Cousins  [facility] Pager [phone]  *Please refer to [email or web address], password [email or web address] to get updated schedule on who will round on this patient, as hospitalists switch teams weekly. If 7PM-7AM, please contact night-coverage at [email or web address], password [email or web address]  for any overnight needs.  10/26/2015, 7:28 AM

## 2015-10-26 NOTE — Progress Notes (Signed)
Dr. Aileen Fass notified of hypactive bowel sounds, coarse lung sounds on admission and now on right anteriorly, and patient being treated for "thrush" a short time ago coupled with his difficulty swallowing pills last night.

## 2015-10-26 NOTE — Progress Notes (Signed)
Dr. Olevia Bowens notified via text message of patient's inability to void despite feeling the need, and that he had to be I/O cath'd last night.

## 2015-10-26 NOTE — Progress Notes (Signed)
Initial Nutrition Assessment  DOCUMENTATION CODES:   Non-severe (moderate) malnutrition in context of chronic illness  INTERVENTION:    Diet advancement per MD as able.  Once diet advanced, add PO supplement as needed.  NUTRITION DIAGNOSIS:   Malnutrition related to chronic illness as evidenced by mild depletion of muscle mass, percent weight loss (13% weight loss within the past 4 months).  GOAL:   Patient will meet greater than or equal to 90% of their needs  MONITOR:   Diet advancement, PO intake, Labs, Weight trends, I & O's  REASON FOR ASSESSMENT:   Consult Assessment of nutrition requirement/status  ASSESSMENT:   54 y.o. male with a past medical history of obesity, HTN, HLD, systolic CHF, CAD, AICD, MI who presented on 12/19 with alcohol withdrawals and chest pain.  Labs reviewed: magnesium low at 1.1. Nutrition-Focused physical exam completed. Findings are no fat depletion, mild muscle depletion, and no edema. He has lost 13% of usual weight over the past 4 months. Patient with moderate PCM.  Patient reports that he has been eating poorly and endorses weight loss. He was unable to provide much nutrition history, as he was sleepy and wanted water to drink.   Diet Order:  Diet NPO time specified Except for: Sips with Meds  Skin:  Reviewed, no issues  Last BM:  12/12  Height:   Ht Readings from Last 1 Encounters:  10/25/15 6' (1.829 m)    Weight:   Wt Readings from Last 1 Encounters:  10/25/15 214 lb (97.07 kg)    Ideal Body Weight:  80.9 kg  BMI:  Body mass index is 29.02 kg/(m^2).  Estimated Nutritional Needs:   Kcal:  2000-2200  Protein:  100-120 gm  Fluid:  2-2.2 L  EDUCATION NEEDS:   No education needs identified at this time  Molli Barrows, [place], [place], [place] Pager [phone] After Hours Pager [phone]

## 2015-10-26 NOTE — Progress Notes (Signed)
Forrest Moron NP, notified last night about elevated heart rate,SBP and PRN med given. Patient c/o unable to void I&O cath done 300cc amber urine. Safety sitter at bedside.

## 2015-10-27 ENCOUNTER — Inpatient Hospital Stay (HOSPITAL_COMMUNITY): Payer: Medicare PPO

## 2015-10-27 ENCOUNTER — Encounter: Payer: Medicare PPO | Admitting: Nurse Practitioner

## 2015-10-27 DIAGNOSIS — R079 Chest pain, unspecified: Secondary | ICD-10-CM

## 2015-10-27 MED ORDER — MAGNESIUM SULFATE 4 GM/100ML IV SOLN
4.0000 g | Freq: Once | INTRAVENOUS | Status: AC
  Administered 2015-10-27: 4 g via INTRAVENOUS
  Filled 2015-10-27: qty 100

## 2015-10-27 NOTE — Care Management Note (Signed)
Case Management Note Marvetta Gibbons RN, BSN Unit 2W-Case Manager [phone]  Patient Details  Name: KUTLER GUPTA MRN: [id number] Date of Birth: Mar 31, 1961  Subjective/Objective:   Pt admitted with chest pain, and alcohol withdrawal- on CIWA protocol                 Action/Plan: PTA  Pt lived at home- currently disoriented- CM to follow as pt progresses for d/c needs  Expected Discharge Date:                  Expected Discharge Plan:  Home/Self Care  In-House Referral:  Clinical Social Work  Discharge planning Services  CM Consult  Post Acute Care Choice:    Choice offered to:     DME Arranged:    DME Agency:     HH Arranged:    [place] Agency:     Status of Service:  In process, will continue to follow  Medicare Important Message Given:    Date Medicare IM Given:    Medicare IM give by:    Date Additional Medicare IM Given:    Additional Medicare Important Message give by:     If discussed at [place] of Stay Meetings, dates discussed:    Additional Comments:  Dawayne Patricia, RN 10/27/2015, 10:31 AM

## 2015-10-27 NOTE — Progress Notes (Signed)
Utilization review completed.  

## 2015-10-27 NOTE — Progress Notes (Signed)
Echocardiogram 2D Echocardiogram has been performed.  [name] 10/27/2015, 12:53 PM

## 2015-10-27 NOTE — Clinical Documentation Improvement (Signed)
Internal Medicine  Can the diagnosis of altered mental status, confusion be further specified?   Confusion/delirium (including drug induced)  Encephalopathy - Alcoholic, Anoxic/Hypoxia, Drug Induced/Toxic (specify drug), Hepatic, Hypertensive, Hypoglycemic, Metabolic/Septic, Traumatic/post concussive, Wernicke, Other  Other  Clinically Undetermined  Document any associated diagnoses/conditions.  Supporting Information:  ETOH withdrawal with moderate malnutrition  Please exercise your independent, professional judgment when responding. A specific answer is not anticipated or expected.  Thank You,  Zoila Shutter RN, BSN, [place] [phone]; Cell: [phone]

## 2015-10-27 NOTE — Clinical Social Work Note (Signed)
CSW following patient for substance abuse/ETOH needs. CSW notes that patient is disoriented at this time and not appropriate for assessment. CSW will continue to follow and assist as possible.    Liz Beach MSW, [place], [place], [phone]

## 2015-10-28 DIAGNOSIS — G9340 Encephalopathy, unspecified: Secondary | ICD-10-CM

## 2015-10-28 LAB — RENAL FUNCTION PANEL
ANION GAP: 10 (ref 5–15)
Albumin: 2.2 g/dL — ABNORMAL LOW (ref 3.5–5.0)
CO2: 24 mmol/L (ref 22–32)
Calcium: 8.1 mg/dL — ABNORMAL LOW (ref 8.9–10.3)
Chloride: 101 mmol/L (ref 101–111)
Creatinine, Ser: 0.73 mg/dL (ref 0.61–1.24)
GFR calc Af Amer: >60 mL/min (ref >60)
GFR calc non Af Amer: >60 mL/min (ref >60)
GLUCOSE: 106 mg/dL — AB (ref 65–99)
POTASSIUM: 3.1 mmol/L — AB (ref 3.5–5.1)
Phosphorus: 3.1 mg/dL (ref 2.5–4.6)
SODIUM: 135 mmol/L (ref 135–145)

## 2015-10-28 MED ORDER — POTASSIUM CHLORIDE CRYS ER 20 MEQ PO TBCR
20.0000 meq | EXTENDED_RELEASE_TABLET | Freq: Every day | ORAL | Status: DC
  Administered 2015-10-28 – 2015-10-30 (×3): 20 meq via ORAL
  Filled 2015-10-28 (×3): qty 1

## 2015-10-28 MED ORDER — CETYLPYRIDINIUM CHLORIDE 0.05 % MT LIQD
7.0000 mL | Freq: Three times a day (TID) | OROMUCOSAL | Status: DC
  Administered 2015-10-28 – 2015-10-30 (×4): 7 mL via OROMUCOSAL

## 2015-10-28 MED ORDER — POTASSIUM CHLORIDE CRYS ER 20 MEQ PO TBCR
40.0000 meq | EXTENDED_RELEASE_TABLET | Freq: Two times a day (BID) | ORAL | Status: AC
  Administered 2015-10-28 (×2): 40 meq via ORAL
  Filled 2015-10-28 (×3): qty 2

## 2015-10-28 MED ORDER — FUROSEMIDE 40 MG PO TABS
40.0000 mg | ORAL_TABLET | Freq: Every day | ORAL | Status: DC
  Administered 2015-10-29 – 2015-10-30 (×2): 40 mg via ORAL
  Filled 2015-10-28 (×2): qty 1

## 2015-10-28 MED ORDER — SPIRONOLACTONE 25 MG PO TABS
25.0000 mg | ORAL_TABLET | Freq: Every day | ORAL | Status: DC
  Administered 2015-10-29 – 2015-10-30 (×2): 25 mg via ORAL
  Filled 2015-10-28 (×2): qty 1

## 2015-10-28 NOTE — Care Management Important Message (Signed)
Important Message  Patient Details  Name: Stephen [name] MRN: [id number] Date of Birth: 11-Jan-1961   Medicare Important Message Given:  Yes    Nathen May 10/28/2015, 2:35 PM

## 2015-10-28 NOTE — Progress Notes (Signed)
PATIENT MORE ALERT AND ORIENTED. AWARE OF SELF, LOCATION IN [place], [place] THAT IS NOT [place], TIME: END OF December 2016, AND REASON FOR HOSPITALIZATION: ETOH ABUSE.  PATIENT STATES THAT HE HAS NOT HAD BM IN MULTIPLE DAYS AND HAS NEVER GONE THIS LONG WITHOUT HAVING ONE. GIVEN SCHEDULED MIRALAX (LATE) IN JUICE. MEDICATED WITH TYLENOL FOR ABDOMINAL PAIN AND PO ATIVAN FOR "INSOMNIA."  PATIENT'S SWALLOWING CAPABILITY IS NORMAL. NO SIGNS OR INDICATION OF ASPIRATION.  MOST RECENT CIWA SCORE LESS THAN 5.  SITTER REMAINS AT BEDSIDE.

## 2015-10-28 NOTE — Progress Notes (Signed)
Patient lying in bed watching television no distress, mild headache. Call light within reach.

## 2015-10-28 NOTE — Progress Notes (Signed)
[facility] PROGRESS NOTE    Progress Note   Stephen [name] [id number] DOB: 14-Oct-1961 DOA: 10/25/2015 PCP: [name], MD   Brief Narrative:   Stephen [name] is an 54 y.o. male 54 y.o. 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 He was sent to the emergency department today for evaluation from his cardiologist's office. He had an appointment with his primary care physician in [place] but due to confusion went to see [facility] heart care  Assessment/Plan:   Acute encephalopathy likely due to Alcohol withdrawal ([place]): CIWA continues to be elevated, continue CIWA protocol. Start on thiamine and folate. Still tachycardic allow diet pt is awake.  Essential hypertension Blood pressure improved. Continue Bisoprolol and enalapril. resume lasix in am  [place], CHRONIC/Cardiomyopathy, ischemic: Previous 2-D echo showed an EF of 40% repeat a 2-D echo. Restart Lasix and Aldactone in am  Oral thrush: Cont him on Diflucan.  Cardiac defibrillator-Medtronic-single-chamber  Right-sided Abdominal pain: I cannot appreciate any right upper quadrant tenderness, AST is mildly more elevated than ALT bilirubin and alkaline phosphatase   Constipation Start MiraLAX twice a day.    DVT Prophylaxis - Xarelto.  Family Communication: none Disposition Plan: Home in am Code Status:     Code Status Orders        Start     Ordered   10/25/15 1745  Full code   Continuous     10/25/15 1744        IV Access:    Peripheral IV   Procedures and diagnostic studies:   No results found.   Medical Consultants:    None.  Anti-Infectives:   Anti-infectives    Start     Dose/Rate Route Frequency Ordered Stop   10/27/15 0800  fluconazole (DIFLUCAN) IVPB 100 mg     100 mg 50 mL/hr over 60 Minutes Intravenous Every 24 hours  10/26/15 1700     10/26/15 1700  fluconazole (DIFLUCAN) IVPB 100 mg  Status:  Discontinued     100 mg 50 mL/hr over 60 Minutes Intravenous Every 24 hours 10/26/15 1657 10/26/15 1700   10/26/15 0800  fluconazole (DIFLUCAN) IVPB 100 mg  Status:  Discontinued     100 mg 50 mL/hr over 60 Minutes Intravenous Every 24 hours 10/26/15 0754 10/26/15 1659      Subjective:    Stephen [name] hungry will like to eat  Objective:    Filed Vitals:   10/27/15 2016 10/27/15 2144 10/28/15 0555 10/28/15 1026  BP: 146/113 125/86 117/89 130/88  Pulse: 113 109 115 117  Temp: 98.4 F (36.9 C)  98.7 F (37.1 C) 99.3 F (37.4 C)  TempSrc: Oral  Oral   Resp: 18  19   Height:      Weight:      SpO2: 99% 97% 92% 100%    Intake/Output Summary (Last 24 hours) at 10/28/15 1253 Last data filed at 10/28/15 1100  Gross per 24 hour  Intake      0 ml  Output    800 ml  Net   -800 ml   Filed Weights   10/25/15 1408  Weight: 97.07 kg (214 lb)    Exam: Gen:  NAD Cardiovascular:  RRR. Chest and lungs:   Good air movement and clear to auscultation. Abdomen:  Positive bowel sounds soft nontender Extremities:  No lower extremity edema.   Data Reviewed:    Labs: Basic  Metabolic Panel:  Recent Labs Lab 10/25/15 1440 10/25/15 1812 10/28/15 0406  NA 135  --  135  K 3.8  --  3.1*  CL 101  --  101  CO2 20*  --  24  GLUCOSE 107*  --  106*  BUN 6  --  <5*  CREATININE 0.89  --  0.73  CALCIUM 8.7*  --  8.1*  MG  --  1.1*  --   PHOS  --   --  3.1   GFR Estimated Creatinine Clearance: 126 mL/min (by C-G formula based on Cr of 0.73). Liver Function Tests:  Recent Labs Lab 10/25/15 1812 10/28/15 0406  AST 57*  --   ALT 46  --   ALKPHOS 89  --   BILITOT 1.0  --   PROT 5.7*  --   ALBUMIN 2.6* 2.2*    Recent Labs Lab 10/25/15 1812  LIPASE 34    Recent Labs Lab 10/25/15 1457  AMMONIA 41*   Coagulation profile No results for input(s): INR, PROTIME in the last 168  hours.  CBC:  Recent Labs Lab 10/25/15 1440  WBC 7.5  HGB 12.6*  HCT 37.3*  MCV 96.6  PLT 189   Cardiac Enzymes:  Recent Labs Lab 10/25/15 1812 10/25/15 2320 10/26/15 0515  TROPONINI <0.03 <0.03 <0.03   BNP (last 3 results) No results for input(s): PROBNP in the last 8760 hours. CBG: No results for input(s): GLUCAP in the last 168 hours. D-Dimer:  Recent Labs  10/25/15 1452  DDIMER 0.34   Hgb A1c: No results for input(s): HGBA1C in the last 72 hours. Lipid Profile: No results for input(s): CHOL, HDL, LDLCALC, TRIG, CHOLHDL, LDLDIRECT in the last 72 hours. Thyroid function studies: No results for input(s): TSH, T4TOTAL, T3FREE, THYROIDAB in the last 72 hours.  Invalid input(s): FREET3 Anemia work up: No results for input(s): VITAMINB12, FOLATE, FERRITIN, TIBC, IRON, RETICCTPCT in the last 72 hours. Sepsis Labs:  Recent Labs Lab 10/25/15 1440  WBC 7.5   Microbiology Recent Results (from the past 240 hour(s))  MRSA PCR Screening     Status: None   Collection Time: 10/25/15  5:43 PM  Result Value Ref Range Status   MRSA by PCR NEGATIVE NEGATIVE Final    Comment:        The GeneXpert MRSA Assay (FDA approved for NASAL specimens only), is one component of a comprehensive MRSA colonization surveillance program. It is not intended to diagnose MRSA infection nor to guide or monitor treatment for MRSA infections.   Culture, blood (Routine X 2) w Reflex to ID Panel     Status: None (Preliminary result)   Collection Time: 10/26/15  8:34 AM  Result Value Ref Range Status   Specimen Description BLOOD RIGHT ANTECUBITAL  Final   Special Requests BOTTLES DRAWN AEROBIC AND ANAEROBIC 5CC  Final   Culture NO GROWTH 1 DAY  Final   Report Status PENDING  Incomplete  Culture, blood (Routine X 2) w Reflex to ID Panel     Status: None (Preliminary result)   Collection Time: 10/26/15  8:42 AM  Result Value Ref Range Status   Specimen Description BLOOD RIGHT HAND   Final   Special Requests BOTTLES DRAWN AEROBIC AND ANAEROBIC 5CC  Final   Culture NO GROWTH 1 DAY  Final   Report Status PENDING  Incomplete     Medications:   . antiseptic oral rinse  7 mL Mouth Rinse TID  . aspirin  325 mg Oral  QODAY  . bisoprolol  5 mg Oral BID  . cholecalciferol  1,000 Units Oral Daily  . enalapril  10 mg Oral Daily  . fluconazole (DIFLUCAN) IV  100 mg Intravenous Q24H  . fluticasone  1 spray Each Nare Daily  . folic acid  1 mg Oral Daily  . LORazepam  0-4 mg Intravenous Q12H  . LORazepam  0.5 mg Intravenous Once  . multivitamin with minerals  1 tablet Oral Daily  . nicotine  21 mg Transdermal Daily  . pantoprazole  40 mg Oral Daily  . polyethylene glycol  17 g Oral BID  . pravastatin  20 mg Oral q1800  . rivaroxaban  20 mg Oral Q supper  . thiamine  100 mg Intravenous Daily  . thiamine  100 mg Oral Daily   Or  . thiamine  100 mg Intravenous Daily   Continuous Infusions:   Time spent: 25 min   LOS: 3 days   Stephen [name]  [facility] Pager [phone]  *Please refer to [email or web address], password [email or web address] to get updated schedule on who will round on this patient, as hospitalists switch teams weekly. If 7PM-7AM, please contact night-coverage at [email or web address], password [email or web address] for any overnight needs.  10/28/2015, 12:53 PM

## 2015-10-28 NOTE — Progress Notes (Signed)
Patient removed SCD's refused to have them put back on.

## 2015-10-29 DIAGNOSIS — Z95810 Presence of automatic (implantable) cardiac defibrillator: Secondary | ICD-10-CM

## 2015-10-29 DIAGNOSIS — I255 Ischemic cardiomyopathy: Secondary | ICD-10-CM

## 2015-10-29 DIAGNOSIS — K5901 Slow transit constipation: Secondary | ICD-10-CM

## 2015-10-29 LAB — CBC
HEMATOCRIT: 33.1 % — AB (ref 39.0–52.0)
HEMOGLOBIN: 10.7 g/dL — AB (ref 13.0–17.0)
MCH: 32.6 pg (ref 26.0–34.0)
MCHC: 32.3 g/dL (ref 30.0–36.0)
MCV: 100.9 fL — ABNORMAL HIGH (ref 78.0–100.0)
Platelets: 168 10*3/uL (ref 150–400)
RBC: 3.28 MIL/uL — ABNORMAL LOW (ref 4.22–5.81)
RDW: 15.9 % — ABNORMAL HIGH (ref 11.5–15.5)
WBC: 5.8 10*3/uL (ref 4.0–10.5)

## 2015-10-29 LAB — MAGNESIUM: Magnesium: 1.6 mg/dL — ABNORMAL LOW (ref 1.7–2.4)

## 2015-10-29 LAB — BASIC METABOLIC PANEL
Anion gap: 9 (ref 5–15)
BUN: 6 mg/dL (ref 6–20)
CHLORIDE: 107 mmol/L (ref 101–111)
CO2: 25 mmol/L (ref 22–32)
Calcium: 8.6 mg/dL — ABNORMAL LOW (ref 8.9–10.3)
Creatinine, Ser: 0.87 mg/dL (ref 0.61–1.24)
GFR calc Af Amer: >60 mL/min (ref >60)
GFR calc non Af Amer: >60 mL/min (ref >60)
GLUCOSE: 95 mg/dL (ref 65–99)
POTASSIUM: 4.2 mmol/L (ref 3.5–5.1)
SODIUM: 141 mmol/L (ref 135–145)

## 2015-10-29 MED ORDER — BISOPROLOL FUMARATE 5 MG PO TABS
10.0000 mg | ORAL_TABLET | Freq: Two times a day (BID) | ORAL | Status: DC
  Administered 2015-10-29 – 2015-10-30 (×2): 10 mg via ORAL
  Filled 2015-10-29 (×2): qty 2

## 2015-10-29 MED ORDER — ASPIRIN EC 325 MG PO TBEC
325.0000 mg | DELAYED_RELEASE_TABLET | ORAL | Status: DC
  Administered 2015-10-29: 325 mg via ORAL
  Filled 2015-10-29: qty 1

## 2015-10-29 NOTE — Progress Notes (Addendum)
[facility] PROGRESS NOTE    Progress Note   Stephen [name] [id number] DOB: 1961-01-31 DOA: 10/25/2015 PCP: [name], MD   Brief Narrative:   Stephen [name] is an 54 y.o. male 54 y.o. 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 He was sent to the emergency department today for evaluation from his cardiologist's office. He had an appointment with his primary care physician in [place] but due to confusion was sent to the hospital. He found to be withdrawing start on CIWA protocol. Symptoms were hard to control on admission but since then has improved slowly.  Assessment/Plan:   Acute encephalopathy likely due to Alcohol withdrawal ([place]): Continues to improved, continue protocol, protocol should finish tonight. Cont on thiamine and folate. Tolerating diet.  Essential hypertension Blood pressure is high, cont to monitor. Increase Bisoprolol as HR is elevated,cont enalapril. Cont lasix and aldactone.  SYSTOLIC HEART FAILURE, CHRONIC/Cardiomyopathy, ischemic: Previous 2-D echo showed an EF of 40% repeat a 2-D echo. Restart Lasix and Aldactone.  Oral thrush: Cont him on Diflucan.  Cardiac defibrillator-Medtronic-single-chamber  Right-sided Abdominal pain: AST is mildly more elevated than ALT, bilirubin and alkaline phosphatase not elevated. Now resolved.  Constipation cont MiraLAX twice a day.    DVT Prophylaxis - Xarelto.  Family Communication: none Disposition Plan: Home in 24 hrs Code Status:     Code Status Orders        Start     Ordered   10/25/15 1745  Full code   Continuous     10/25/15 1744        IV Access:    Peripheral IV   Procedures and diagnostic studies:   No results found.   Medical Consultants:    None.  Anti-Infectives:   Anti-infectives    Start     Dose/Rate Route  Frequency Ordered Stop   10/27/15 0800  fluconazole (DIFLUCAN) IVPB 100 mg     100 mg 50 mL/hr over 60 Minutes Intravenous Every 24 hours 10/26/15 1700     10/26/15 1700  fluconazole (DIFLUCAN) IVPB 100 mg  Status:  Discontinued     100 mg 50 mL/hr over 60 Minutes Intravenous Every 24 hours 10/26/15 1657 10/26/15 1700   10/26/15 0800  fluconazole (DIFLUCAN) IVPB 100 mg  Status:  Discontinued     100 mg 50 mL/hr over 60 Minutes Intravenous Every 24 hours 10/26/15 0754 10/26/15 1659      Subjective:    Stephen [name] tolerating diet.  Objective:    Filed Vitals:   10/28/15 1400 10/28/15 1939 10/29/15 0332 10/29/15 1051  BP: 106/67 123/90 129/93 143/95  Pulse: 101 116 106 101  Temp: 98.5 F (36.9 C) 97.8 F (36.6 C) 98.2 F (36.8 C) 97.7 F (36.5 C)  TempSrc: Oral Oral Oral Oral  Resp: 17 18 18 18   Height:      Weight:      SpO2: 99% 99% 97% 98%    Intake/Output Summary (Last 24 hours) at 10/29/15 1236 Last data filed at 10/29/15 1100  Gross per 24 hour  Intake    480 ml  Output      0 ml  Net    480 ml   Filed Weights   10/25/15 1408  Weight: 97.07 kg (214 lb)    Exam: Gen:  NAD Cardiovascular:  RRR. Chest and lungs:   Good air movement and clear to auscultation. Abdomen:  Positive  bowel sounds soft nontender Extremities:  No lower extremity edema.   Data Reviewed:    Labs: Basic Metabolic Panel:  Recent Labs Lab 10/25/15 1440 10/25/15 1812 10/28/15 0406 10/29/15 0328  NA 135  --  135 141  K 3.8  --  3.1* 4.2  CL 101  --  101 107  CO2 20*  --  24 25  GLUCOSE 107*  --  106* 95  BUN 6  --  <5* 6  CREATININE 0.89  --  0.73 0.87  CALCIUM 8.7*  --  8.1* 8.6*  MG  --  1.1*  --  1.6*  PHOS  --   --  3.1  --    GFR Estimated Creatinine Clearance: 115.9 mL/min (by C-G formula based on Cr of 0.87). Liver Function Tests:  Recent Labs Lab 10/25/15 1812 10/28/15 0406  AST 57*  --   ALT 46  --   ALKPHOS 89  --   BILITOT 1.0  --   PROT 5.7*   --   ALBUMIN 2.6* 2.2*    Recent Labs Lab 10/25/15 1812  LIPASE 34    Recent Labs Lab 10/25/15 1457  AMMONIA 41*   Coagulation profile No results for input(s): INR, PROTIME in the last 168 hours.  CBC:  Recent Labs Lab 10/25/15 1440 10/29/15 0328  WBC 7.5 5.8  HGB 12.6* 10.7*  HCT 37.3* 33.1*  MCV 96.6 100.9*  PLT 189 168   Cardiac Enzymes:  Recent Labs Lab 10/25/15 1812 10/25/15 2320 10/26/15 0515  TROPONINI <0.03 <0.03 <0.03   BNP (last 3 results) No results for input(s): PROBNP in the last 8760 hours. CBG: No results for input(s): GLUCAP in the last 168 hours. D-Dimer: No results for input(s): DDIMER in the last 72 hours. Hgb A1c: No results for input(s): HGBA1C in the last 72 hours. Lipid Profile: No results for input(s): CHOL, HDL, LDLCALC, TRIG, CHOLHDL, LDLDIRECT in the last 72 hours. Thyroid function studies: No results for input(s): TSH, T4TOTAL, T3FREE, THYROIDAB in the last 72 hours.  Invalid input(s): FREET3 Anemia work up: No results for input(s): VITAMINB12, FOLATE, FERRITIN, TIBC, IRON, RETICCTPCT in the last 72 hours. Sepsis Labs:  Recent Labs Lab 10/25/15 1440 10/29/15 0328  WBC 7.5 5.8   Microbiology Recent Results (from the past 240 hour(s))  MRSA PCR Screening     Status: None   Collection Time: 10/25/15  5:43 PM  Result Value Ref Range Status   MRSA by PCR NEGATIVE NEGATIVE Final    Comment:        The GeneXpert MRSA Assay (FDA approved for NASAL specimens only), is one component of a comprehensive MRSA colonization surveillance program. It is not intended to diagnose MRSA infection nor to guide or monitor treatment for MRSA infections.   Culture, blood (Routine X 2) w Reflex to ID Panel     Status: None (Preliminary result)   Collection Time: 10/26/15  8:34 AM  Result Value Ref Range Status   Specimen Description BLOOD RIGHT ANTECUBITAL  Final   Special Requests BOTTLES DRAWN AEROBIC AND ANAEROBIC 5CC  Final    Culture NO GROWTH 3 DAYS  Final   Report Status PENDING  Incomplete  Culture, blood (Routine X 2) w Reflex to ID Panel     Status: None (Preliminary result)   Collection Time: 10/26/15  8:42 AM  Result Value Ref Range Status   Specimen Description BLOOD RIGHT HAND  Final   Special Requests BOTTLES DRAWN AEROBIC AND ANAEROBIC 5CC  Final   Culture NO GROWTH 3 DAYS  Final   Report Status PENDING  Incomplete     Medications:   . antiseptic oral rinse  7 mL Mouth Rinse TID  . aspirin EC  325 mg Oral QODAY  . bisoprolol  10 mg Oral BID  . cholecalciferol  1,000 Units Oral Daily  . enalapril  10 mg Oral Daily  . fluconazole (DIFLUCAN) IV  100 mg Intravenous Q24H  . fluticasone  1 spray Each Nare Daily  . folic acid  1 mg Oral Daily  . furosemide  40 mg Oral Daily  . LORazepam  0-4 mg Intravenous Q12H  . LORazepam  0.5 mg Intravenous Once  . multivitamin with minerals  1 tablet Oral Daily  . nicotine  21 mg Transdermal Daily  . pantoprazole  40 mg Oral Daily  . polyethylene glycol  17 g Oral BID  . potassium chloride SA  20 mEq Oral Daily  . pravastatin  20 mg Oral q1800  . rivaroxaban  20 mg Oral Q supper  . spironolactone  25 mg Oral Daily  . thiamine  100 mg Oral Daily   Or  . thiamine  100 mg Intravenous Daily   Continuous Infusions:   Time spent: 15 min   LOS: 4 days   Charlynne Cousins  [facility] Pager [phone]  *Please refer to [email or web address], password [email or web address] to get updated schedule on who will round on this patient, as hospitalists switch teams weekly. If 7PM-7AM, please contact night-coverage at [email or web address], password [email or web address] for any overnight needs.  10/29/2015, 12:36 PM

## 2015-10-29 NOTE — Progress Notes (Signed)
Patient unable to sleep and is becoming increasing restless. Gave prn dose of Ativan, will continue to monitor

## 2015-10-29 NOTE — Progress Notes (Signed)
Patient repeatedly causing bed alarm to go off.  Found patient lying across the bed trying to turn it off. Will continue to monitor, bed alarm back on.

## 2015-10-30 DIAGNOSIS — F1020 Alcohol dependence, uncomplicated: Secondary | ICD-10-CM

## 2015-10-30 MED ORDER — ZOLPIDEM TARTRATE 5 MG PO TABS
5.0000 mg | ORAL_TABLET | Freq: Once | ORAL | Status: AC
  Administered 2015-10-30: 5 mg via ORAL
  Filled 2015-10-30: qty 1

## 2015-10-30 MED ORDER — THIAMINE HCL 100 MG PO TABS: 100.0000 mg | ORAL_TABLET | Freq: Every day | ORAL | Status: DC

## 2015-10-30 MED ORDER — MAGNESIUM OXIDE 400 (241.3 MG) MG PO TABS
400.0000 mg | ORAL_TABLET | Freq: Two times a day (BID) | ORAL | Status: DC
  Administered 2015-10-30: 400 mg via ORAL

## 2015-10-30 MED ORDER — BISOPROLOL FUMARATE 5 MG PO TABS: 10.0000 mg | ORAL_TABLET | Freq: Two times a day (BID) | ORAL | Status: DC

## 2015-10-30 MED ORDER — MAGNESIUM OXIDE 400 (241.3 MG) MG PO TABS
400.0000 mg | ORAL_TABLET | Freq: Two times a day (BID) | ORAL | Status: DC
Start: 2015-10-30 — End: 2015-12-16

## 2015-10-30 NOTE — Evaluation (Addendum)
Physical Therapy Evaluation Patient Details Name: Stephen [name] MRN: [id number] DOB: 01-16-1961 Today's Date: 10/30/2015   History of Present Illness  54 yo M admitted with ETOH withdrawal.    Clinical Impression  Pt admitted with above diagnosis. Pt currently with minor limitations due to the deficits listed below (see PT Problem List). If pt remains in the hospital pt will benefit from PT to increase his safety with mobility.  He feels his walking is better than his recent baseline. Pt anticipates DC today with his brother.  I have encouraged the patient to gradually increase activity daily to tolerance.     Follow Up Recommendations No PT follow up;Supervision - Intermittent    Equipment Recommendations  Other (comment) (pt can borrow RW from his brother)    Recommendations for Other Services       Precautions / Restrictions Precautions Precautions: Fall Restrictions Weight Bearing Restrictions: No      Mobility  Bed Mobility Overal bed mobility: Independent                Transfers Overall transfer level: Needs assistance Equipment used: None Transfers: Sit to/from Stand Sit to Stand: Supervision            Ambulation/Gait Ambulation/Gait assistance: Min guard Ambulation Distance (Feet): 200 Feet Assistive device: None Gait Pattern/deviations: Step-through pattern;Decreased stride length;Wide base of support     General Gait Details: Pt reports his gait is much better than it had been recently.   Stairs            Wheelchair Mobility    Modified Rankin (Stroke Patients Only)       Balance Overall balance assessment: Needs assistance Sitting-balance support: No upper extremity supported Sitting balance-Leahy Scale: Normal     Standing balance support: No upper extremity supported Standing balance-Leahy Scale: Good               High level balance activites: Side stepping;Backward walking;Direction changes;Turns (No balance  losses when performing high level activities)               Pertinent Vitals/Pain Pain Assessment: No/denies pain    Home Living Family/patient expects to be discharged to:: Private residence Living Arrangements: Other (Comment) (Brother) Available Help at Discharge: Family;Available 24 hours/day Type of Home: House Home Access: Stairs to enter Entrance Stairs-Rails: Right;Left;Can reach both Entrance Stairs-Number of Steps: 4 Home Layout: One level Home Equipment: Cane - single point;Other (comment) (can borrow RW from his brother)      Prior Function Level of Independence: Independent with assistive device(s)               Hand Dominance        Extremity/Trunk Assessment   Upper Extremity Assessment: Overall WFL for tasks assessed           Lower Extremity Assessment: Overall WFL for tasks assessed      Cervical / Trunk Assessment: Normal  Communication   Communication: No difficulties  Cognition Arousal/Alertness: Awake/alert Behavior During Therapy: WFL for tasks assessed/performed Overall Cognitive Status: Within Functional Limits for tasks assessed                      General Comments General comments (skin integrity, edema, etc.): Pt reports getting lightheaded at times and that he has bad knees.  Both of these issues could cause a fall at any time but no overt balance issues noted on eval. Instructed pt to use a can at home since  he has that and has used it in the past. Also advised pt that balnce is typically better in shoes than bare feet.     Exercises        Assessment/Plan    PT Assessment Patient needs continued PT services  PT Diagnosis Abnormality of gait   PT Problem List Decreased mobility  PT Treatment Interventions Gait training;Stair training;Patient/family education   PT Goals (Current goals can be found in the Care Plan section) Acute Rehab PT Goals Patient Stated Goal: pt wants to go home today PT Goal  Formulation: With patient Time For Goal Achievement: 11/06/15 Potential to Achieve Goals: Good    Frequency Min 3X/week   Barriers to discharge        Co-evaluation               End of Session Equipment Utilized During Treatment: Gait belt Activity Tolerance: Patient tolerated treatment well Patient left: in bed;with call bell/phone within reach;with bed alarm set Nurse Communication: Mobility status         Time: [id number] PT Time Calculation (min) (ACUTE ONLY): 29 min   Charges:   PT Evaluation $Initial PT Evaluation Tier I: 1 Procedure PT Treatments $Gait Training: 8-22 mins   PT G CodesMelvern Banker 10/30/2015, 2:53 PM  Lavonia Dana, [place] 10/30/2015

## 2015-10-30 NOTE — Progress Notes (Signed)
Pt having trouble falling asleep and wanted something to help him sleep.  Ativan 1 mg IV was already given around 09:15 PM this evening.  Dr. Donnal Debar was informed and ordered a one time dose of Ambien 5 mg PO.  Pt has been given the Ambien. Will continue to monitor pt. Lupita Dawn, RN

## 2015-10-30 NOTE — Discharge Instructions (Signed)

## 2015-10-30 NOTE — Progress Notes (Signed)
Wife called and spoke with this RN r/t concern for her ex-husband. Stated he told her he plans to start drinking when he is d/c'ed. Spoke with Dr. Curly Rim, he stated patient is medically stable and he [the patient] has requested to d/c, that he appreciates the family's concern but the patient cannot be forced. The patient must be willing to follow-up as an outpatient. Before I could return her call, wife called CN, he reinforced the same. Informed her rehab is outpatient in the community.

## 2015-10-30 NOTE — Progress Notes (Signed)
D/C information reviewed including AVS printout and prescriptions. Patient did ask for information r/t ETOH treatment - wrote out [facility] contact information, address and phone number on the AVS printout. His brother is coming to pick him up to drive home to say with him.

## 2015-10-30 NOTE — Progress Notes (Signed)
Spoke with his wife Chief Executive Officer by phone, informed her I provided him with information for contacting [facility], address and phone number r/t alcohol rehab outpatient - written out on his d/c papers.

## 2015-10-30 NOTE — Discharge Summary (Signed)
[facility]  Physician Discharge Summary   Patient ID: Stephen [name] MRN: [id number] DOB/AGE: 1961/04/29 54 y.o.  Admit date: 10/25/2015 Discharge date: 10/30/2015  PCP: [name], MD  DISCHARGE DIAGNOSES:  Principal Problem:   Alcohol withdrawal ([place]) Active Problems:   Essential hypertension   SYSTOLIC HEART FAILURE, CHRONIC   Cardiac defibrillator-Medtronic-single-chamber   Cardiomyopathy, ischemic   Atypical chest pain   Abdominal pain, right upper quadrant   Constipation   Acute encephalopathy   RECOMMENDATIONS FOR OUTPATIENT FOLLOW UP: 1. Patient is to follow-up with his PCP within a week. 2. He will need to discuss outpatient resources for alcohol rehabilitation   DISCHARGE CONDITION: fair  Diet recommendation: Heart healthy  Filed Weights   10/25/15 1408  Weight: 97.07 kg (214 lb)    INITIAL HISTORY: Stephen [name] is an 54 y.o. male 54 y.o. 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 He was sent to the emergency department today for evaluation from his cardiologist's office. He had an appointment with his primary care physician in [place] but due to confusion was sent to the hospital. He found to be withdrawing start on CIWA protocol. Symptoms were hard to control on admission but since then has improved slowly.  Consultations:  None  Procedures:  None  HOSPITAL COURSE:   Acute encephalopathy likely due to Alcohol withdrawal Start to be alcohol related encephalopathy. Patient was placed on CIWA protocol for withdrawal. He slowly started improving. Dental status is now back to baseline. He is alert and oriented 3. He understands that he needs to stop drinking and needs to go for rehabilitation. He should continue taking thiamine. Patient does have capacity to make his own decisions.  Essential  hypertension The pressure was poorly controlled, likely due to withdrawal symptoms. He may continue his home medications. Dose of bisoprolol has been increased. Continue other home meds.   SYSTOLIC HEART FAILURE, CHRONIC/Cardiomyopathy, ischemic Previous 2-D echo showed an EF of 40%. Echocardiogram was repeated during this hospitalization, which did not show any significant changes. Continue his home medication regimen including his diuretics.  Oral thrush: Patient was given Diflucan during this hospital stay. No need for further treatment.   Cardiac defibrillator-Medtronic-single-chamber  Right-sided Abdominal pain AST is mildly more elevated than ALT, bilirubin and alkaline phosphatase not elevated. Likely due to alcohol. Now resolved. Ultrasound showed incidental cholelithiasis. No evidence for cholecystitis.  Overall, stable and improved. Patient seen by physical therapy and there are no therapy needs. Patient has capacity. He would like to go home today. He does not exhibit any further signs and symptoms of alcohol withdrawal. He is okay for discharge.   PERTINENT LABS:  The results of significant diagnostics from this hospitalization (including imaging, microbiology, ancillary and laboratory) are listed below for reference.    Microbiology: Recent Results (from the past 240 hour(s))  MRSA PCR Screening     Status: None   Collection Time: 10/25/15  5:43 PM  Result Value Ref Range Status   MRSA by PCR NEGATIVE NEGATIVE Final    Comment:        The GeneXpert MRSA Assay (FDA approved for NASAL specimens only), is one component of a comprehensive MRSA colonization surveillance program. It is not intended to diagnose MRSA infection nor to guide or monitor treatment for MRSA infections.   Culture, blood (Routine X 2) w Reflex to ID Panel     Status: None (Preliminary result)  Collection Time: 10/26/15  8:34 AM  Result Value Ref Range Status   Specimen Description BLOOD RIGHT  ANTECUBITAL  Final   Special Requests BOTTLES DRAWN AEROBIC AND ANAEROBIC 5CC  Final   Culture NO GROWTH 4 DAYS  Final   Report Status PENDING  Incomplete  Culture, blood (Routine X 2) w Reflex to ID Panel     Status: None (Preliminary result)   Collection Time: 10/26/15  8:42 AM  Result Value Ref Range Status   Specimen Description BLOOD RIGHT HAND  Final   Special Requests BOTTLES DRAWN AEROBIC AND ANAEROBIC 5CC  Final   Culture NO GROWTH 4 DAYS  Final   Report Status PENDING  Incomplete     Labs: Basic Metabolic Panel:  Recent Labs Lab 10/25/15 1440 10/25/15 1812 10/28/15 0406 10/29/15 0328  NA 135  --  135 141  K 3.8  --  3.1* 4.2  CL 101  --  101 107  CO2 20*  --  24 25  GLUCOSE 107*  --  106* 95  BUN 6  --  <5* 6  CREATININE 0.89  --  0.73 0.87  CALCIUM 8.7*  --  8.1* 8.6*  MG  --  1.1*  --  1.6*  PHOS  --   --  3.1  --    Liver Function Tests:  Recent Labs Lab 10/25/15 1812 10/28/15 0406  AST 57*  --   ALT 46  --   ALKPHOS 89  --   BILITOT 1.0  --   PROT 5.7*  --   ALBUMIN 2.6* 2.2*    Recent Labs Lab 10/25/15 1812  LIPASE 34    Recent Labs Lab 10/25/15 1457  AMMONIA 41*   CBC:  Recent Labs Lab 10/25/15 1440 10/29/15 0328  WBC 7.5 5.8  HGB 12.6* 10.7*  HCT 37.3* 33.1*  MCV 96.6 100.9*  PLT 189 168   Cardiac Enzymes:  Recent Labs Lab 10/25/15 1812 10/25/15 2320 10/26/15 0515  TROPONINI <0.03 <0.03 <0.03   BNP: BNP (last 3 results)  Recent Labs  10/25/15 1430  BNP 47.0     IMAGING STUDIES  US Abdomen Complete  10/26/2015  CLINICAL DATA:  Abdominal pain. EXAM: ABDOMEN ULTRASOUND COMPLETE COMPARISON:  None. FINDINGS: Gallbladder: Gallstones. Gallbladder wall thickness 2.6 mm. Negative Murphy sign. Common bile duct: Diameter: 5.7 mm. Liver: Echogenic liver consistent with fatty infiltration and/or hepatocellular disease . IVC: No abnormality visualized. Pancreas: Visualized portion unremarkable. Spleen: Size and appearance  within normal limits. Right Kidney: Length: 10.3 cm. Echogenicity within normal limits. No mass or hydronephrosis visualized. Left Kidney: Length: 11.2 cm. Echogenicity within normal limits. No mass or hydronephrosis visualized. Abdominal aorta: No aneurysm visualized. Other findings: None. IMPRESSION: 1. Gallstones. 2. Echogenic liver consistent fatty infiltration and/or hepatocellular disease. Electronically Signed   By: Marcello Moores  Register   On: 10/26/2015 08:46   Dg Chest Portable 1 View  10/25/2015  CLINICAL DATA:  Chronic chest pain for several weeks EXAM: PORTABLE CHEST 1 VIEW COMPARISON:  10/15/2015 FINDINGS: Heart size upper normal. Stable 2 lead cardiac pacer. Vascular pattern normal. Lungs clear. No pleural effusions. IMPRESSION: No active disease. Electronically Signed   By: Skipper Cliche M.D.   On: 10/25/2015 14:28    DISCHARGE EXAMINATION: Filed Vitals:   10/29/15 2003 10/30/15 0413 10/30/15 0639 10/30/15 1342  BP: 144/91 139/104 137/97 98/63  Pulse: 102 104 97 95  Temp: 98 F (36.7 C) 97.7 F (36.5 C)  98 F (36.7 C)  TempSrc:  Oral Oral  Oral  Resp: 18 18  19   Height:      Weight:      SpO2: 99% 97%  95%   General appearance: alert, cooperative, appears stated age and no distress Resp: clear to auscultation bilaterally Cardio: regular rate and rhythm, S1, S2 normal, no murmur, click, rub or gallop GI: soft, non-tender; bowel sounds normal; no masses,  no organomegaly Extremities: extremities normal, atraumatic, no cyanosis or edema Neurologic: Alert and oriented X 3, normal strength and tone. Normal symmetric reflexes. Normal coordination and gait  DISPOSITION: Home  Discharge Instructions    Call MD for:  difficulty breathing, headache or visual disturbances    Complete by:  As directed      Call MD for:  extreme fatigue    Complete by:  As directed      Call MD for:  persistant dizziness or light-headedness    Complete by:  As directed      Call MD for:   persistant nausea and vomiting    Complete by:  As directed      Call MD for:  redness, tenderness, or signs of infection (pain, swelling, redness, odor or green/yellow discharge around incision site)    Complete by:  As directed      Call MD for:  severe uncontrolled pain    Complete by:  As directed      Call MD for:  temperature >100.4    Complete by:  As directed      Diet - low sodium heart healthy    Complete by:  As directed      Discharge instructions    Complete by:  As directed   Please follow-up with your primary care physician next week. Please taking medications as prescribed.  You were cared for by a hospitalist during your hospital stay. If you have any questions about your discharge medications or the care you received while you were in the hospital after you are discharged, you can call the unit and asked to speak with the hospitalist on call if the hospitalist that took care of you is not available. Once you are discharged, your primary care physician will handle any further medical issues. Please note that NO REFILLS for any discharge medications will be authorized once you are discharged, as it is imperative that you return to your primary care physician (or establish a relationship with a primary care physician if you do not have one) for your aftercare needs so that they can reassess your need for medications and monitor your lab values. If you do not have a primary care physician, you can call [phone] for a physician referral.     Increase activity slowly    Complete by:  As directed            ALLERGIES:  Allergies  Allergen Reactions  . Haldol [Haloperidol Lactate]     Went crazy, rash      Current Discharge Medication List    START taking these medications   Details  magnesium oxide (MAG-OX) 400 (241.3 MG) MG tablet Take 1 tablet (400 mg total) by mouth 2 (two) times daily. Qty: 30 tablet, Refills: 0    thiamine 100 MG tablet Take 1 tablet (100 mg  total) by mouth daily. Qty: 30 tablet, Refills: 2      CONTINUE these medications which have CHANGED   Details  bisoprolol (ZEBETA) 5 MG tablet Take 2 tablets (10 mg total) by mouth 2 (two)  times daily. Qty: 120 tablet, Refills: 1      CONTINUE these medications which have NOT CHANGED   Details  aspirin 81 MG EC tablet Take 81 mg by mouth every other day.     cholecalciferol (VITAMIN D) 1000 UNITS tablet Take 1,000 Units by mouth daily.     enalapril (VASOTEC) 10 MG tablet Take 10 mg by mouth daily.     folic acid (FOLVITE) 1 MG tablet Take 1 mg by mouth every other day.     furosemide (LASIX) 40 MG tablet Take 40 mg by mouth daily.    KLOR-CON M20 20 MEQ tablet Take 20 mEq by mouth daily.    mometasone (NASONEX) 50 MCG/ACT nasal spray Place 2 sprays into the nose daily as needed. For nasal congestion    nitroGLYCERIN (NITROSTAT) 0.4 MG SL tablet Place 0.4 mg under the tongue every 5 (five) minutes as needed for chest pain.    omeprazole (PRILOSEC) 40 MG capsule Take 40 mg by mouth daily.    pravastatin (PRAVACHOL) 20 MG tablet Take 20 mg by mouth daily.     rivaroxaban (XARELTO) 20 MG TABS tablet Take 20 mg by mouth daily with supper.    spironolactone (ALDACTONE) 25 MG tablet Take 1 tablet (25 mg total) by mouth daily. Qty: 30 tablet, Refills: 6    tetrahydrozoline-zinc (VISINE-AC) 0.05-0.25 % ophthalmic solution Place 2 drops into both eyes 3 (three) times daily as needed (redness relief).       Follow-up Information    Follow up with [name], MD. Schedule an appointment as soon as possible for a visit in 1 week.   Specialty:  Family Medicine   Why:  post hospitalization follow up   Contact information:   [address] [place] [address] [phone]       TOTAL DISCHARGE TIME: 66 minutes  [facility] Pager [phone]  10/30/2015, 5:51 PM

## 2015-10-31 LAB — CULTURE, BLOOD (ROUTINE X 2)
CULTURE: NO GROWTH
Culture: NO GROWTH

## 2015-11-11 ENCOUNTER — Emergency Department
Admission: EM | Admit: 2015-11-11 | Discharge: 2015-11-11 | Discharge status: Against Medical Advice | Payer: Medicare Other | Attending: Emergency Medicine | Admitting: Emergency Medicine

## 2015-11-11 DIAGNOSIS — I10 Essential (primary) hypertension: Secondary | ICD-10-CM | POA: Insufficient documentation

## 2015-11-11 DIAGNOSIS — F17210 Nicotine dependence, cigarettes, uncomplicated: Secondary | ICD-10-CM | POA: Insufficient documentation

## 2015-11-11 DIAGNOSIS — F1010 Alcohol abuse, uncomplicated: Secondary | ICD-10-CM | POA: Diagnosis not present

## 2015-11-11 NOTE — ED Notes (Signed)
Patient presents to ED for alcohol detox. Patient informed that alcohol detox not done here at [facility]; confirmed with TTS intake representative. Patient and visitor curing and becoming aggressive. [employer] officer asked to speak with patient and visitor; this RN out of room to speak with ED charge nurse regarding situation.  Calls made to several [facility] facilities - no detox beds available per Roxanna (TTS). Of note, patient recently left [facility] after being admitted for detox - referred to the [facility]. RN asked patient if he had touched base with this facility - patient and visitor indicated that this is the "first that they are hearing of it". RN explained to patient and visitor that information regarding follow up was reviewed with patient at time of discharge from [facility]. Visitor began ranting about insurance and how it used to be that "you couldn't get in unless you had insurance. Now that Obama took over you cant get in with insurance". RN attempted redirection. Patient referred to [facility] in [place], [place] - visitor states, "We have been around and around with them in the past. They don't help no body. He needs to come into the hospital. If he goes home all he does is drink. He cant do outpatient." Per TTS recommendations, patient provided with the address and phone number to [facility]. TTS confirmed that they are a detox facility. Patient and visitor leaving willingly at this time en route to [facility].

## 2015-12-16 ENCOUNTER — Inpatient Hospital Stay (HOSPITAL_COMMUNITY)
Admission: EM | Admit: 2015-12-16 | Discharge: 2015-12-21 | DRG: 897 | Disposition: A | Discharge status: Skilled Nursing Facility | Payer: Medicare Other | Attending: Internal Medicine | Admitting: Internal Medicine

## 2015-12-16 ENCOUNTER — Emergency Department (HOSPITAL_COMMUNITY): Payer: Medicare Other

## 2015-12-16 ENCOUNTER — Encounter (HOSPITAL_COMMUNITY): Payer: Self-pay | Admitting: Emergency Medicine

## 2015-12-16 DIAGNOSIS — E785 Hyperlipidemia, unspecified: Secondary | ICD-10-CM | POA: Diagnosis present

## 2015-12-16 DIAGNOSIS — F1010 Alcohol abuse, uncomplicated: Secondary | ICD-10-CM

## 2015-12-16 DIAGNOSIS — I5022 Chronic systolic (congestive) heart failure: Secondary | ICD-10-CM | POA: Diagnosis present

## 2015-12-16 DIAGNOSIS — D696 Thrombocytopenia, unspecified: Secondary | ICD-10-CM | POA: Diagnosis not present

## 2015-12-16 DIAGNOSIS — I480 Paroxysmal atrial fibrillation: Secondary | ICD-10-CM | POA: Diagnosis present

## 2015-12-16 DIAGNOSIS — F1994 Other psychoactive substance use, unspecified with psychoactive substance-induced mood disorder: Secondary | ICD-10-CM | POA: Diagnosis not present

## 2015-12-16 DIAGNOSIS — Z9119 Patient's noncompliance with other medical treatment and regimen: Secondary | ICD-10-CM | POA: Diagnosis not present

## 2015-12-16 DIAGNOSIS — D72829 Elevated white blood cell count, unspecified: Secondary | ICD-10-CM | POA: Diagnosis not present

## 2015-12-16 DIAGNOSIS — Y900 Blood alcohol level of less than 20 mg/100 ml: Secondary | ICD-10-CM | POA: Diagnosis present

## 2015-12-16 DIAGNOSIS — Z95810 Presence of automatic (implantable) cardiac defibrillator: Secondary | ICD-10-CM | POA: Diagnosis not present

## 2015-12-16 DIAGNOSIS — R000 Tachycardia, unspecified: Secondary | ICD-10-CM | POA: Diagnosis present

## 2015-12-16 DIAGNOSIS — I10 Essential (primary) hypertension: Secondary | ICD-10-CM | POA: Diagnosis present

## 2015-12-16 DIAGNOSIS — I429 Cardiomyopathy, unspecified: Secondary | ICD-10-CM | POA: Diagnosis not present

## 2015-12-16 DIAGNOSIS — I248 Other forms of acute ischemic heart disease: Secondary | ICD-10-CM | POA: Diagnosis present

## 2015-12-16 DIAGNOSIS — G4700 Insomnia, unspecified: Secondary | ICD-10-CM | POA: Diagnosis present

## 2015-12-16 DIAGNOSIS — Z91128 Patient's intentional underdosing of medication regimen for other reason: Secondary | ICD-10-CM

## 2015-12-16 DIAGNOSIS — I110 Hypertensive heart disease with heart failure: Secondary | ICD-10-CM | POA: Diagnosis present

## 2015-12-16 DIAGNOSIS — F10229 Alcohol dependence with intoxication, unspecified: Principal | ICD-10-CM | POA: Diagnosis present

## 2015-12-16 DIAGNOSIS — F419 Anxiety disorder, unspecified: Secondary | ICD-10-CM | POA: Diagnosis present

## 2015-12-16 DIAGNOSIS — I252 Old myocardial infarction: Secondary | ICD-10-CM

## 2015-12-16 DIAGNOSIS — Z7901 Long term (current) use of anticoagulants: Secondary | ICD-10-CM | POA: Diagnosis not present

## 2015-12-16 DIAGNOSIS — I255 Ischemic cardiomyopathy: Secondary | ICD-10-CM | POA: Diagnosis present

## 2015-12-16 DIAGNOSIS — G4733 Obstructive sleep apnea (adult) (pediatric): Secondary | ICD-10-CM | POA: Diagnosis present

## 2015-12-16 DIAGNOSIS — K2970 Gastritis, unspecified, without bleeding: Secondary | ICD-10-CM | POA: Diagnosis present

## 2015-12-16 DIAGNOSIS — F329 Major depressive disorder, single episode, unspecified: Secondary | ICD-10-CM | POA: Diagnosis present

## 2015-12-16 DIAGNOSIS — T447X6A Underdosing of beta-adrenoreceptor antagonists, initial encounter: Secondary | ICD-10-CM | POA: Diagnosis present

## 2015-12-16 DIAGNOSIS — I472 Ventricular tachycardia, unspecified: Secondary | ICD-10-CM

## 2015-12-16 DIAGNOSIS — I471 Supraventricular tachycardia, unspecified: Secondary | ICD-10-CM | POA: Insufficient documentation

## 2015-12-16 DIAGNOSIS — R0902 Hypoxemia: Secondary | ICD-10-CM

## 2015-12-16 DIAGNOSIS — E860 Dehydration: Secondary | ICD-10-CM | POA: Diagnosis present

## 2015-12-16 DIAGNOSIS — I2510 Atherosclerotic heart disease of native coronary artery without angina pectoris: Secondary | ICD-10-CM | POA: Diagnosis present

## 2015-12-16 DIAGNOSIS — F10239 Alcohol dependence with withdrawal, unspecified: Secondary | ICD-10-CM | POA: Diagnosis present

## 2015-12-16 DIAGNOSIS — E876 Hypokalemia: Secondary | ICD-10-CM

## 2015-12-16 DIAGNOSIS — Z86711 Personal history of pulmonary embolism: Secondary | ICD-10-CM | POA: Diagnosis not present

## 2015-12-16 DIAGNOSIS — Z7982 Long term (current) use of aspirin: Secondary | ICD-10-CM | POA: Diagnosis not present

## 2015-12-16 DIAGNOSIS — F17210 Nicotine dependence, cigarettes, uncomplicated: Secondary | ICD-10-CM | POA: Diagnosis present

## 2015-12-16 LAB — I-STAT TROPONIN, ED: TROPONIN I, POC: 0.03 ng/mL (ref 0.00–0.08)

## 2015-12-16 LAB — COMPREHENSIVE METABOLIC PANEL
ALBUMIN: 2.1 g/dL — AB (ref 3.5–5.0)
ALK PHOS: 96 U/L (ref 38–126)
ALT: 62 U/L (ref 17–63)
ANION GAP: 19 — AB (ref 5–15)
AST: 73 U/L — AB (ref 15–41)
BUN: 13 mg/dL (ref 6–20)
CALCIUM: 8.8 mg/dL — AB (ref 8.9–10.3)
CO2: 21 mmol/L — AB (ref 22–32)
Chloride: 95 mmol/L — ABNORMAL LOW (ref 101–111)
Creatinine, Ser: 1.12 mg/dL (ref 0.61–1.24)
GFR calc Af Amer: >60 mL/min (ref >60)
GFR calc non Af Amer: >60 mL/min (ref >60)
GLUCOSE: 129 mg/dL — AB (ref 65–99)
Potassium: 2.8 mmol/L — ABNORMAL LOW (ref 3.5–5.1)
SODIUM: 135 mmol/L (ref 135–145)
Total Bilirubin: 2.4 mg/dL — ABNORMAL HIGH (ref 0.3–1.2)
Total Protein: 5.8 g/dL — ABNORMAL LOW (ref 6.5–8.1)

## 2015-12-16 LAB — I-STAT CHEM 8, ED
BUN: 19 mg/dL (ref 6–20)
CHLORIDE: 90 mmol/L — AB (ref 101–111)
Calcium, Ion: 1.01 mmol/L — ABNORMAL LOW (ref 1.12–1.23)
Creatinine, Ser: 1.2 mg/dL (ref 0.61–1.24)
GLUCOSE: 147 mg/dL — AB (ref 65–99)
HCT: 56 % — ABNORMAL HIGH (ref 39.0–52.0)
Hemoglobin: 19 g/dL — ABNORMAL HIGH (ref 13.0–17.0)
POTASSIUM: 4.2 mmol/L (ref 3.5–5.1)
Sodium: 130 mmol/L — ABNORMAL LOW (ref 135–145)
TCO2: 25 mmol/L (ref 0–100)

## 2015-12-16 LAB — MAGNESIUM: Magnesium: 0.7 mg/dL — CL (ref 1.7–2.4)

## 2015-12-16 LAB — CBC WITH DIFFERENTIAL/PLATELET
Basophils Absolute: 0 10*3/uL (ref 0.0–0.1)
Basophils Relative: 0 %
EOS ABS: 0 10*3/uL (ref 0.0–0.7)
Eosinophils Relative: 0 %
HEMATOCRIT: 41.9 % (ref 39.0–52.0)
HEMOGLOBIN: 14.5 g/dL (ref 13.0–17.0)
LYMPHS ABS: 0.8 10*3/uL (ref 0.7–4.0)
LYMPHS PCT: 5 %
MCH: 33.7 pg (ref 26.0–34.0)
MCHC: 34.6 g/dL (ref 30.0–36.0)
MCV: 97.4 fL (ref 78.0–100.0)
Monocytes Absolute: 2 10*3/uL — ABNORMAL HIGH (ref 0.1–1.0)
Monocytes Relative: 12 %
NEUTROS ABS: 13.4 10*3/uL — AB (ref 1.7–7.7)
NEUTROS PCT: 83 %
Platelets: 103 10*3/uL — ABNORMAL LOW (ref 150–400)
RBC: 4.3 MIL/uL (ref 4.22–5.81)
RDW: 14.6 % (ref 11.5–15.5)
WBC: 16.2 10*3/uL — AB (ref 4.0–10.5)

## 2015-12-16 LAB — CBC
HCT: 38.6 % — ABNORMAL LOW (ref 39.0–52.0)
HEMOGLOBIN: 13.1 g/dL (ref 13.0–17.0)
MCH: 32.9 pg (ref 26.0–34.0)
MCHC: 33.9 g/dL (ref 30.0–36.0)
MCV: 97 fL (ref 78.0–100.0)
Platelets: 92 10*3/uL — ABNORMAL LOW (ref 150–400)
RBC: 3.98 MIL/uL — AB (ref 4.22–5.81)
RDW: 14.5 % (ref 11.5–15.5)
WBC: 16.1 10*3/uL — AB (ref 4.0–10.5)

## 2015-12-16 LAB — ETHANOL: Alcohol, Ethyl (B): 24 mg/dL — ABNORMAL HIGH (ref <5)

## 2015-12-16 LAB — TROPONIN I
TROPONIN I: 0.08 ng/mL — AB (ref <0.031)
TROPONIN I: 0.05 ng/mL — AB (ref <0.031)

## 2015-12-16 LAB — CREATININE, SERUM
Creatinine, Ser: 1.02 mg/dL (ref 0.61–1.24)
GFR calc non Af Amer: >60 mL/min (ref >60)

## 2015-12-16 LAB — MRSA PCR SCREENING: MRSA BY PCR: NEGATIVE

## 2015-12-16 LAB — LIPASE, BLOOD: Lipase: 17 U/L (ref 11–51)

## 2015-12-16 MED ORDER — POTASSIUM CHLORIDE IN NACL 40-0.9 MEQ/L-% IV SOLN
INTRAVENOUS | Status: DC
  Administered 2015-12-16: 50 mL/h via INTRAVENOUS
  Filled 2015-12-16 (×5): qty 1000

## 2015-12-16 MED ORDER — SODIUM CHLORIDE 0.9 % IV SOLN
Freq: Once | INTRAVENOUS | Status: AC
  Administered 2015-12-16: 100 mL/h via INTRAVENOUS

## 2015-12-16 MED ORDER — POTASSIUM CHLORIDE CRYS ER 20 MEQ PO TBCR: 20.0000 meq | EXTENDED_RELEASE_TABLET | Freq: Every day | ORAL | Status: DC

## 2015-12-16 MED ORDER — LORAZEPAM 2 MG/ML IJ SOLN
0.0000 mg | Freq: Two times a day (BID) | INTRAMUSCULAR | Status: DC
  Administered 2015-12-16: 1 mg via INTRAVENOUS
  Filled 2015-12-16: qty 1

## 2015-12-16 MED ORDER — PANTOPRAZOLE SODIUM 40 MG PO TBEC
40.0000 mg | DELAYED_RELEASE_TABLET | Freq: Every day | ORAL | Status: DC
  Administered 2015-12-16 – 2015-12-21 (×5): 40 mg via ORAL
  Filled 2015-12-16 (×6): qty 1

## 2015-12-16 MED ORDER — SODIUM CHLORIDE 0.9 % IV BOLUS (SEPSIS)
1000.0000 mL | Freq: Once | INTRAVENOUS | Status: AC
  Administered 2015-12-16: 1000 mL via INTRAVENOUS

## 2015-12-16 MED ORDER — SODIUM CHLORIDE 0.9% FLUSH
3.0000 mL | Freq: Two times a day (BID) | INTRAVENOUS | Status: DC
  Administered 2015-12-17 – 2015-12-21 (×9): 3 mL via INTRAVENOUS

## 2015-12-16 MED ORDER — NITROGLYCERIN 0.4 MG SL SUBL: 0.4000 mg | SUBLINGUAL_TABLET | SUBLINGUAL | Status: DC | PRN

## 2015-12-16 MED ORDER — VITAMIN B-1 100 MG PO TABS: 100.0000 mg | ORAL_TABLET | Freq: Every day | ORAL | Status: DC

## 2015-12-16 MED ORDER — LORAZEPAM 2 MG/ML IJ SOLN
2.0000 mg | INTRAMUSCULAR | Status: DC | PRN
  Administered 2015-12-16 – 2015-12-17 (×5): 2 mg via INTRAVENOUS
  Administered 2015-12-17 – 2015-12-18 (×3): 3 mg via INTRAVENOUS
  Filled 2015-12-16: qty 2
  Filled 2015-12-16 (×4): qty 1
  Filled 2015-12-16: qty 2
  Filled 2015-12-16 (×2): qty 1
  Filled 2015-12-16: qty 2

## 2015-12-16 MED ORDER — FOLIC ACID 5 MG/ML IJ SOLN
1.0000 mg | Freq: Every day | INTRAMUSCULAR | Status: DC
  Filled 2015-12-16: qty 0.2

## 2015-12-16 MED ORDER — METOPROLOL TARTRATE 1 MG/ML IV SOLN
2.5000 mg | Freq: Four times a day (QID) | INTRAVENOUS | Status: DC | PRN
Start: 2015-12-16 — End: 2015-12-21
  Administered 2015-12-16: 2.5 mg via INTRAVENOUS
  Filled 2015-12-16: qty 5

## 2015-12-16 MED ORDER — METOPROLOL TARTRATE 1 MG/ML IV SOLN
5.0000 mg | Freq: Once | INTRAVENOUS | Status: AC
  Administered 2015-12-16: 5 mg via INTRAVENOUS
  Filled 2015-12-16: qty 5

## 2015-12-16 MED ORDER — LORAZEPAM 1 MG PO TABS: 0.0000 mg | ORAL_TABLET | Freq: Four times a day (QID) | ORAL | Status: DC

## 2015-12-16 MED ORDER — ALBUTEROL SULFATE (2.5 MG/3ML) 0.083% IN NEBU: 2.5000 mg | INHALATION_SOLUTION | RESPIRATORY_TRACT | Status: DC | PRN

## 2015-12-16 MED ORDER — ADENOSINE 6 MG/2ML IV SOLN
INTRAVENOUS | Status: AC
  Administered 2015-12-16: 6 mg
  Filled 2015-12-16: qty 8

## 2015-12-16 MED ORDER — VITAMIN B-1 100 MG PO TABS
100.0000 mg | ORAL_TABLET | Freq: Every day | ORAL | Status: DC
  Administered 2015-12-18 – 2015-12-20 (×2): 100 mg via ORAL
  Filled 2015-12-16 (×2): qty 1

## 2015-12-16 MED ORDER — LORAZEPAM 2 MG/ML IJ SOLN: 0.0000 mg | Freq: Four times a day (QID) | INTRAMUSCULAR | Status: DC

## 2015-12-16 MED ORDER — LORAZEPAM 1 MG PO TABS: 0.0000 mg | ORAL_TABLET | Freq: Two times a day (BID) | ORAL | Status: DC

## 2015-12-16 MED ORDER — NAPHAZOLINE-GLYCERIN 0.012-0.2 % OP SOLN: 2.0000 [drp] | Freq: Four times a day (QID) | OPHTHALMIC | Status: DC | PRN

## 2015-12-16 MED ORDER — BISOPROLOL FUMARATE 5 MG PO TABS
10.0000 mg | ORAL_TABLET | Freq: Two times a day (BID) | ORAL | Status: DC
  Administered 2015-12-16 – 2015-12-19 (×5): 10 mg via ORAL
  Filled 2015-12-16 (×7): qty 2

## 2015-12-16 MED ORDER — THIAMINE HCL 100 MG/ML IJ SOLN
100.0000 mg | Freq: Every day | INTRAMUSCULAR | Status: DC
  Administered 2015-12-16: 100 mg via INTRAVENOUS
  Filled 2015-12-16: qty 2

## 2015-12-16 MED ORDER — ASPIRIN EC 81 MG PO TBEC
81.0000 mg | DELAYED_RELEASE_TABLET | ORAL | Status: DC
  Administered 2015-12-17 – 2015-12-21 (×3): 81 mg via ORAL
  Filled 2015-12-16 (×4): qty 1

## 2015-12-16 MED ORDER — HEPARIN SODIUM (PORCINE) 5000 UNIT/ML IJ SOLN: 5000.0000 [IU] | Freq: Three times a day (TID) | INTRAMUSCULAR | Status: DC

## 2015-12-16 MED ORDER — ADULT MULTIVITAMIN W/MINERALS CH
1.0000 | ORAL_TABLET | Freq: Every day | ORAL | Status: DC
  Administered 2015-12-16 – 2015-12-21 (×5): 1 via ORAL
  Filled 2015-12-16 (×6): qty 1

## 2015-12-16 MED ORDER — THIAMINE HCL 100 MG/ML IJ SOLN: 100.0000 mg | Freq: Every day | INTRAMUSCULAR | Status: DC

## 2015-12-16 MED ORDER — PRAVASTATIN SODIUM 20 MG PO TABS
20.0000 mg | ORAL_TABLET | Freq: Every day | ORAL | Status: DC
  Administered 2015-12-18 – 2015-12-21 (×4): 20 mg via ORAL
  Filled 2015-12-16 (×5): qty 1

## 2015-12-16 MED ORDER — RIVAROXABAN 20 MG PO TABS
20.0000 mg | ORAL_TABLET | Freq: Every day | ORAL | Status: DC
  Administered 2015-12-16 – 2015-12-21 (×4): 20 mg via ORAL
  Filled 2015-12-16 (×7): qty 1

## 2015-12-16 MED ORDER — VITAMIN D 1000 UNITS PO TABS
1000.0000 [IU] | ORAL_TABLET | Freq: Every day | ORAL | Status: DC
Start: 2015-12-17 — End: 2015-12-21
  Administered 2015-12-17 – 2015-12-21 (×5): 1000 [IU] via ORAL
  Filled 2015-12-16 (×5): qty 1

## 2015-12-16 MED ORDER — TIOTROPIUM BROMIDE MONOHYDRATE 18 MCG IN CAPS
18.0000 ug | ORAL_CAPSULE | Freq: Every day | RESPIRATORY_TRACT | Status: DC | PRN
  Filled 2015-12-16: qty 5

## 2015-12-16 MED ORDER — FOLIC ACID 1 MG PO TABS: 1.0000 mg | ORAL_TABLET | Freq: Every day | ORAL | Status: DC

## 2015-12-16 MED ORDER — MAGNESIUM SULFATE 4 GM/100ML IV SOLN
4.0000 g | Freq: Once | INTRAVENOUS | Status: AC
  Administered 2015-12-16: 4 g via INTRAVENOUS
  Filled 2015-12-16: qty 100

## 2015-12-16 MED ORDER — POTASSIUM CHLORIDE 10 MEQ/100ML IV SOLN
10.0000 meq | INTRAVENOUS | Status: AC
  Administered 2015-12-16 (×3): 10 meq via INTRAVENOUS
  Filled 2015-12-16 (×3): qty 100

## 2015-12-16 MED ORDER — LORAZEPAM 2 MG/ML IJ SOLN
1.0000 mg | Freq: Once | INTRAMUSCULAR | Status: AC
  Administered 2015-12-16: 1 mg via INTRAVENOUS
  Filled 2015-12-16: qty 1

## 2015-12-16 MED ORDER — FOLIC ACID 5 MG/ML IJ SOLN
1.0000 mg | Freq: Every day | INTRAMUSCULAR | Status: DC
  Filled 2015-12-16 (×6): qty 0.2

## 2015-12-16 MED ORDER — POTASSIUM CHLORIDE 20 MEQ PO PACK
40.0000 meq | PACK | Freq: Once | ORAL | Status: AC
  Administered 2015-12-16: 40 meq via ORAL
  Filled 2015-12-16: qty 2

## 2015-12-16 MED ORDER — THIAMINE HCL 100 MG/ML IJ SOLN
100.0000 mg | Freq: Every day | INTRAMUSCULAR | Status: DC
  Administered 2015-12-17 – 2015-12-19 (×2): 100 mg via INTRAVENOUS
  Filled 2015-12-16 (×2): qty 2

## 2015-12-16 MED ORDER — FOLIC ACID 1 MG PO TABS
1.0000 mg | ORAL_TABLET | Freq: Every day | ORAL | Status: DC
  Administered 2015-12-16 – 2015-12-21 (×6): 1 mg via ORAL
  Filled 2015-12-16 (×6): qty 1

## 2015-12-16 MED ORDER — ADENOSINE 6 MG/2ML IV SOLN
12.0000 mg | Freq: Once | INTRAVENOUS | Status: AC
  Administered 2015-12-16: 12 mg via INTRAVENOUS

## 2015-12-16 MED ORDER — THIAMINE HCL 100 MG/ML IJ SOLN
Freq: Once | INTRAVENOUS | Status: AC
  Administered 2015-12-16: 18:00:00 via INTRAVENOUS
  Filled 2015-12-16: qty 1000

## 2015-12-16 NOTE — Progress Notes (Signed)
Patient HR maintaining in the 140's updated Dr.Elgergawy received no new orders at this time. Will continue to monitor patient.

## 2015-12-16 NOTE — ED Notes (Signed)
Pt to ED with c/o rapid heart rate, vomiting, and weak feeling. Pt heart rate is 170's-- SVT,

## 2015-12-16 NOTE — Consult Note (Signed)
ELECTROPHYSIOLOGY CONSULT NOTE    Patient ID: Stephen [name] MRN: [id number], DOB/AGE: November 12, 1960 55 y.o.  Admit date: 12/16/2015 Date of Consult: 12/16/2015  Primary Physician: [name], MD Electrophysiologist: Caryl Comes  Reason for Consultation: tachycardia   HPI:  Stephen [name] is a 55 y.o. male with a past medical history significant for hypertension, CAD, ischemic cardiomyopathy, paroxysmal atrial fibrillation, prior PE, and hyperlipidemia. He has a long standing history of alcohol abuse. He has a 3-4 day history of nausea, vomiting, and altered mental status. He reports ongoing alcohol usage drinking up to 1/2 gallon of whiskey every 2 days. He presented to the ER after going to his PCP's office for evaluation of nausea and vomiting and was sent to the ER for evaluation of tachycardia. On arrival, he was in a narrow complex tachycardia at a rate of 177.  His rhythm did not change with adenosine. His rate improved into the 150's with fluids. He was given IV Lopressor in the ER and his rate decreased into the 130's. EP has been asked to evaluate for treatment options.  He currently denies chest pain, shortness of breath, palpitations. He has not had dizziness or frank syncope.   Echo 10/2015 demonstrated EF 40-45%, mild to moderate MR.   Past Medical History  Diagnosis Date  . Obesity   . Hypertension   . Hyperlipidemia   . Systolic heart failure   . Coronary artery disease     native vessel  . AICD (automatic cardioverter/defibrillator) present 2006    [place] 7232  . Ischemic cardiomyopathy     ischemic  . OSA (obstructive sleep apnea)   . [place] Paroxysmal ventricular tachycardia ([place])     Rx via [facility] 2008  . Pulmonary embolism ([place])   . MI, old      Surgical History:  Past Surgical History  Procedure Laterality Date  . Cardiac defibrillator placement  2006    Medtronic [place] 7232  . Knee surgery      left   . Implantable cardioverter  defibrillator generator change N/A 09/02/2014    Procedure: IMPLANTABLE CARDIOVERTER DEFIBRILLATOR GENERATOR CHANGE;  Surgeon: Deboraha Sprang, MD;  Location: [facility] CATH LAB;  Service: Cardiovascular;  Laterality: N/A;  . Lead revision N/A 09/02/2014    Procedure: LEAD REVISION;  Surgeon: Deboraha Sprang, MD;  Location: [facility] CATH LAB;  Service: Cardiovascular;  Laterality: N/A;      (Not in a hospital admission)  Inpatient Medications:  . LORazepam  0-4 mg Intravenous 4 times per day  . LORazepam  0-4 mg Intravenous Q12H  . LORazepam  0-4 mg Oral 4 times per day  . LORazepam  0-4 mg Oral Q12H  . thiamine  100 mg Intravenous Daily  . thiamine  100 mg Oral Daily    Allergies:  Allergies  Allergen Reactions  . Haldol [Haloperidol Lactate]     Went crazy, rash    Social History   Social History  . Marital Status: Married    Spouse Name: N/A  . Number of Children: N/A  . Years of Education: N/A   Occupational History  . Not on file.   Social History Main Topics  . Smoking status: Current Every Day Smoker -- 0.50 packs/day    Types: Cigarettes  . Smokeless tobacco: Never Used  . Alcohol Use: Yes     Comment: at least a pint plus every day  . Drug Use: Yes    Special: Marijuana  Comment: last used 11/06/13  . Sexual Activity: Not on file   Other Topics Concern  . Not on file   Social History Narrative     Family History - no premature CAD    Review of Systems: All other systems reviewed and are otherwise negative except as noted above.  Physical Exam: Filed Vitals:   12/16/15 1341 12/16/15 1414 12/16/15 1500 12/16/15 1515  BP: 140/106 145/104 152/111 146/85  Pulse: 160 151 149 159  Temp: 99.8 F (37.7 C)     TempSrc: Oral     Resp: 12 24 20 17   Height:      Weight:      SpO2: 99% 98% 99% 100%    GEN- The patient is elderly and chronically ill appearing, alert and oriented x 3 today.   HEENT: normocephalic, atraumatic; sclera clear, conjunctiva pink; hearing  intact; oropharynx clear; neck supple  Lungs- Clear to ausculation bilaterally, normal work of breathing.  No wheezes, rales, rhonchi Heart- Tachycardic regular rate and rhythm  GI- soft, non-tender, non-distended, bowel sounds present  Extremities- no clubbing, cyanosis, or edema  MS- no significant deformity or atrophy Skin- warm and dry, no rash or lesion Psych- euthymic mood, full affect Neuro- strength and sensation are intact  Labs:   Lab Results  Component Value Date   WBC 16.2* 12/16/2015   HGB 14.5 12/16/2015   HCT 41.9 12/16/2015   MCV 97.4 12/16/2015   PLT 103* 12/16/2015    Recent Labs Lab 12/16/15 1414  NA 135  K 2.8*  CL 95*  CO2 21*  BUN 13  CREATININE 1.12  CALCIUM 8.8*  PROT 5.8*  BILITOT 2.4*  ALKPHOS 96  ALT 62  AST 73*  GLUCOSE 129*      Radiology/Studies: Dg Chest Port 1 View 12/16/2015  CLINICAL DATA:  Tachycardia EXAM: PORTABLE CHEST 1 VIEW COMPARISON:  10/25/2015 FINDINGS: Cardiac shadow is stable. A defibrillator is again seen and stable. The lungs are clear bilaterally. No acute bony abnormality is seen. IMPRESSION: No active disease. Electronically Signed   By: Inez Catalina M.D.   On: 12/16/2015 13:49    [id number] tach, rate 177, LVH   TELEMETRY: sinus tach  DEVICE HISTORY: MDT single chamber ICD implanted 2006; gen change 2016 with new RV lead to replace 6949   Assessment/Plan: 1.  Sinus tachycardia Likely multifactorial 2/2 dehydration, alcohol abuse.  His rates have improved significantly with fluids.  Continue gentle hydration, Stephen [name] need to monitor fluid status with cardiomyopathy. Continue BB, can give IV Lopressor as needed   2.  Elevated troponin I doubt ischemia Trend troponins  3.  ICM/CAD Continue medical therapy Normal ICD function this admission  4.  Hypokalemia Replete  5.  OSA Non compliant with CPAP  6.  ETOH abuse Cessation advised   7. Atrial fibrillation: continue beta blocker and Eliquis. This patients  CHA2DS2-VASc Score and unadjusted Ischemic Stroke Rate (% per year) is equal to 3.2 % stroke rate/year from a score of 3  Above score calculated as 1 point each if present [CHF, HTN, DM, Vascular=MI/PAD/Aortic Plaque, Age if 65-74, or Male] Above score calculated as 2 points each if present [Age > 75, or Stroke/TIA/TE]    Admit to medicine. We Stephen [name] follow.   Signed, Chanetta Marshall, NP 12/16/2015 3:54 PM  I have seen and examined this patient with Chanetta Marshall.  Agree with above, note added to reflect my findings.  On exam, tachycardic, regular, no murmurs, lungs clear.  Presented to  the hospital with GI complaints of nausea and vomiting.  Has been drinking a high quantity of EtOH (1/2 gallon every 2 days).  Presents with narrow complex tachycardia.  No complaints of palpitations, occasional chest pain associated with vomiting.  Has been noncompliant with his medications over the last 3 days, not taking his anticoagulation.    Would restart today.  Was given adenosine for tachycardia without change in his rates.  Has since gotten 3L IV fluids with decrease in HR to 150s and 5 mg metoprolol with decrease to 130s.  This is likely sinus tachycardia.  Would continue IV fluids and monitor fluid status.  Would continue beta blockers and give IV metoprolol should he become symptomatic from his tachycardia.  His troponins are elevated, likely due to tachycardia and demand ischemia.  Janiqua Friscia M. Julieth Tugman MD 12/16/2015 4:08 PM

## 2015-12-16 NOTE — ED Provider Notes (Signed)
CSN: [id number]     Arrival date & time 12/16/15  1254 History   First MD Initiated Contact with Patient 12/16/15 1306     Chief Complaint  Patient presents with  . Atrial Fibrillation     (Consider location/radiation/quality/duration/timing/severity/associated sxs/prior Treatment) HPI   3 y m w htn, hl, chf, cad, aicd in place, paroxysmal vtach coming in from clinic with 2 days of emesis that has been intermittently dark colored, no stools in the past two days.  Mild abdominal pain.  Today he went to clinic where he was found to be in svt with rate of 170 and was sent to the ED.  No chest pain/sob.  Past Medical History  Diagnosis Date  . Obesity   . Hypertension   . Hyperlipidemia   . Systolic heart failure   . Coronary artery disease     native vessel  . AICD (automatic cardioverter/defibrillator) present 2006    [place] 7232  . Ischemic cardiomyopathy     ischemic  . OSA (obstructive sleep apnea)   . [place] Paroxysmal ventricular tachycardia ([place])     Rx via ATP 2008  . Pulmonary embolism ([place])   . MI, old    Past Surgical History  Procedure Laterality Date  . Cardiac defibrillator placement  2006    Medtronic Maximo 7232  . Knee surgery      left   . Implantable cardioverter defibrillator generator change N/A 09/02/2014    Procedure: IMPLANTABLE CARDIOVERTER DEFIBRILLATOR GENERATOR CHANGE;  Surgeon: Deboraha Sprang, MD;  Location: [facility] CATH LAB;  Service: Cardiovascular;  Laterality: N/A;  . Lead revision N/A 09/02/2014    Procedure: LEAD REVISION;  Surgeon: Deboraha Sprang, MD;  Location: [facility] CATH LAB;  Service: Cardiovascular;  Laterality: N/A;   No family history on file. Social History  Substance Use Topics  . Smoking status: Current Every Day Smoker -- 0.50 packs/day    Types: Cigarettes  . Smokeless tobacco: Never Used  . Alcohol Use: Yes     Comment: at least a pint plus every day    Review of Systems  Constitutional: Negative for fever  and chills.  Eyes: Negative for redness.  Respiratory: Negative for cough and shortness of breath.   Cardiovascular: Positive for palpitations. Negative for chest pain.  Gastrointestinal: Positive for nausea, vomiting and abdominal pain. Negative for diarrhea.  Genitourinary: Negative for dysuria.  Skin: Negative for rash.  Neurological: Negative for headaches.  All other systems reviewed and are negative.     Allergies  Review of patient's allergies indicates no known allergies.  Home Medications   Prior to Admission medications   Medication Sig Start Date End Date Taking? Authorizing Provider  aspirin 81 MG EC tablet Take 81 mg by mouth every other day.  07/15/14  Yes Eileen Stanford, PA-C  bisoprolol (ZEBETA) 5 MG tablet Take 2 tablets (10 mg total) by mouth 2 (two) times daily. Patient taking differently: Take 10 mg by mouth at bedtime.  10/30/15  Yes Bonnielee Haff, MD  cholecalciferol (VITAMIN D) 1000 UNITS tablet Take 1,000 Units by mouth daily.    Yes Historical Provider, MD  enalapril (VASOTEC) 10 MG tablet Take 10 mg by mouth daily.    Yes Historical Provider, MD  folic acid (FOLVITE) 1 MG tablet Take 1 mg by mouth daily.  11/17/13  Yes Historical Provider, MD  furosemide (LASIX) 40 MG tablet Take 40 mg by mouth daily.   Yes Historical Provider,  MD  KLOR-CON M20 20 MEQ tablet Take 20 mEq by mouth daily. 11/17/13  Yes Historical Provider, MD  mometasone (NASONEX) 50 MCG/ACT nasal spray Place 2 sprays into the nose daily as needed. For nasal congestion   Yes Historical Provider, MD  nitroGLYCERIN (NITROSTAT) 0.4 MG SL tablet Place 0.4 mg under the tongue every 5 (five) minutes as needed for chest pain.   Yes Historical Provider, MD  omeprazole (PRILOSEC) 40 MG capsule Take 40 mg by mouth daily.   Yes Historical Provider, MD  pravastatin (PRAVACHOL) 20 MG tablet Take 20 mg by mouth daily.  05/18/15  Yes Historical Provider, MD  rivaroxaban (XARELTO) 20 MG TABS tablet Take 20 mg  by mouth daily with supper.   Yes Carole Civil, MD  spironolactone (ALDACTONE) 25 MG tablet Take 1 tablet (25 mg total) by mouth daily. 09/03/14  Yes Rogelia Mire, NP  tetrahydrozoline-zinc (VISINE-AC) 0.05-0.25 % ophthalmic solution Place 2 drops into both eyes 3 (three) times daily as needed (redness relief).   Yes Historical Provider, MD  tiotropium (SPIRIVA) 18 MCG inhalation capsule Place 18 mcg into inhaler and inhale daily as needed (shortness of breath).    Yes Historical Provider, MD  magnesium oxide (MAG-OX) 400 (241.3 MG) MG tablet Take 1 tablet (400 mg total) by mouth 2 (two) times daily. Patient not taking: Reported on 12/16/2015 10/30/15   Bonnielee Haff, MD  thiamine 100 MG tablet Take 1 tablet (100 mg total) by mouth daily. Patient not taking: Reported on 12/16/2015 10/30/15   Bonnielee Haff, MD   BP 151/107 mmHg  Pulse 133  Temp(Src) 99.8 F (37.7 C) (Oral)  Resp 17  Ht 6' (1.829 m)  Wt 88.451 kg  BMI 26.44 kg/m2  SpO2 100% Physical Exam  Constitutional: He is oriented to person, place, and time. No distress.  HENT:  Head: Normocephalic and atraumatic.  Eyes: EOM are normal. Pupils are equal, round, and reactive to light.  Neck: Normal range of motion. Neck supple.  Cardiovascular: Intact distal pulses.   Pulmonary/Chest: Effort normal. No respiratory distress.  Abdominal: Soft. There is no tenderness. There is no rebound and no guarding.  Musculoskeletal: Normal range of motion.  Neurological: He is alert and oriented to person, place, and time.  Skin: No rash noted. He is not diaphoretic.  Psychiatric: He has a normal mood and affect.  Nursing note and vitals reviewed.   ED Course  Procedures (including critical care time) Labs Review Labs Reviewed  CBC WITH DIFFERENTIAL/PLATELET - Abnormal; Notable for the following:    WBC 16.2 (*)    Platelets 103 (*)    Neutro Abs 13.4 (*)    Monocytes Absolute 2.0 (*)    All other components within normal  limits  TROPONIN I - Abnormal; Notable for the following:    Troponin I 0.05 (*)    All other components within normal limits  ETHANOL - Abnormal; Notable for the following:    Alcohol, Ethyl (B) 24 (*)    All other components within normal limits  COMPREHENSIVE METABOLIC PANEL - Abnormal; Notable for the following:    Potassium 2.8 (*)    Chloride 95 (*)    CO2 21 (*)    Glucose, Bld 129 (*)    Calcium 8.8 (*)    Total Protein 5.8 (*)    Albumin 2.1 (*)    AST 73 (*)    Total Bilirubin 2.4 (*)    Anion gap 19 (*)    All  other components within normal limits  MAGNESIUM - Abnormal; Notable for the following:    Magnesium 0.7 (*)    All other components within normal limits  I-STAT CHEM 8, ED - Abnormal; Notable for the following:    Sodium 130 (*)    Chloride 90 (*)    Glucose, Bld 147 (*)    Calcium, Ion 1.01 (*)    Hemoglobin 19.0 (*)    HCT 56.0 (*)    All other components within normal limits  LIPASE, BLOOD  CBC WITH DIFFERENTIAL/PLATELET  URINALYSIS, ROUTINE W REFLEX MICROSCOPIC (NOT AT [facility])  URINE RAPID DRUG SCREEN, HOSP PERFORMED  I-STAT TROPOININ, ED    Imaging Review Dg Chest Port 1 View  12/16/2015  CLINICAL DATA:  Tachycardia EXAM: PORTABLE CHEST 1 VIEW COMPARISON:  10/25/2015 FINDINGS: Cardiac shadow is stable. A defibrillator is again seen and stable. The lungs are clear bilaterally. No acute bony abnormality is seen. IMPRESSION: No active disease. Electronically Signed   By: Inez Catalina M.D.   On: 12/16/2015 13:49   I have personally reviewed and evaluated these images and lab results as part of my medical decision-making.   EKG Interpretation   Date/Time:  Thursday December 16 2015 13:08:26 EST Ventricular Rate:  174 PR Interval:    QRS Duration: 101 QT Interval:  317 QTC Calculation: 539 R Axis:   32 Text Interpretation:  Supraventricular tachycardia Inferior infarct, old  Lateral leads are also involved Confirmed by Hazle Coca [phone]) on 12/16/2015   1:55:04 PM      MDM   Final diagnoses:  Tachycardia Dehydration Hypomagnesemia Hypokalemia Supraventricular tachycardia    47 y m w htn, hl, chf, cad, aicd in place, paroxysmal vtach coming in from clinic with 2 days of emesis that has been intermittently dark colored, no stools in the past two days.  Found to be in svt with rate of 170 in clinic  Exam as above.  Tachy to 170.  ekg is regular, narrow, no p-waves, seems like svt Have tried modified valsalva w/o success. Have tried adenosine 6mg  w/o success Adenosine 12 mg with brief conversion to sinus. Will give 2L NS.  Will obtain cbc/bmp/mag/trop/cxr to eval for electrolyte or other metabolic abnormality that might be driving this svt.  Cbc/bmp sig for hypokalemia and leukocytosis.  Will replace k.  Mag low, will replace mag. Cards consulted and has given small dose of lopressor.  Patient with sig etoh hx and withdrawal may be also partially driving this in addition to dehydration.  Will admit for further hydration and correction of electrolytes.  He has a benign abdominal exam w/o sig ttp, doubt dangerous intra-abdominal process.      Jarome Matin, MD 12/16/15 [place], MD 12/17/15 [phone]

## 2015-12-16 NOTE — ED Notes (Signed)
Pt admits to drinking approx 1/2 gallon whiskey in 2 days as well as beer- pt's brother at bedside-- pt asking for water, given green swab-- admits to drinking etoh this am.

## 2015-12-16 NOTE — H&P (Signed)
Patient Demographics  Stephen [name], is a 55 y.o. male  MRN: [id number]   DOB - 07-01-1961  Admit Date - 12/16/2015  Outpatient Primary MD for the patient is [name], MD   With History of -  Past Medical History  Diagnosis Date  . Obesity   . Hypertension   . Hyperlipidemia   . Systolic heart failure   . Coronary artery disease     native vessel  . AICD (automatic cardioverter/defibrillator) present 2006    [name]  . Ischemic cardiomyopathy     ischemic  . OSA (obstructive sleep apnea)   . [place] Paroxysmal ventricular tachycardia ([place])     Rx via ATP 2008  . Pulmonary embolism ([place])   . MI, old       Past Surgical History  Procedure Laterality Date  . Cardiac defibrillator placement  2006    Medtronic Maximo 7232  . Knee surgery      left   . Implantable cardioverter defibrillator generator change N/A 09/02/2014    Procedure: IMPLANTABLE CARDIOVERTER DEFIBRILLATOR GENERATOR CHANGE;  Surgeon: Deboraha Sprang, MD;  Location: [facility] CATH LAB;  Service: Cardiovascular;  Laterality: N/A;  . Lead revision N/A 09/02/2014    Procedure: LEAD REVISION;  Surgeon: Deboraha Sprang, MD;  Location: [facility] CATH LAB;  Service: Cardiovascular;  Laterality: N/A;    in for   Chief Complaint  Patient presents with  . Atrial Fibrillation     HPI  Stephen [name]  is a 56 y.o. male, with alcohol abuse, hypertension, hyperlipidemia, coronary artery disease status post AICD implementation, obstructive sleep apnea, PE on chronic Xarelto therapy, history of noncompliance with medication, patient presents with complaints of nausea and vomiting for last 3 days, as well as altered mental status drink half a gallon of whiskey over 48 hours, Symptoms by PCP for tachycardia, and to see his PCP for evaluation for his nausea and vomiting, workup significant for tachycardia heart rate in the 170s, thought to be SVT in ED, received adenosine 2, no change in rhythm or  heart rate, received fluids, IV Lopressor, Seen by EP, most likely due to sinus tachycardia given dehydration and alcohol abuse, patient with multiple laps abnormalities including severe hypokalemia, hypomagnesemia, hospitalist requested to admit.    Review of Systems    In addition to the HPI above,  No Fever-chills, No Headache, No changes with Vision or hearing, No problems swallowing food or Liquids, No Chest pain, Cough or Shortness of Breath, No Abdominal pain,  complaining of nausea and vomiting, Bowel movements are regular, No Blood in stool or Urine, No dysuria, No new skin rashes or bruises, No new joints pains-aches,  No new weakness, tingling, numbness in any extremity, No recent weight gain or loss, No polyuria, polydypsia or polyphagia, No significant Mental Stressors.  A full 10 point Review of Systems was done, except as stated above, all other Review of Systems were negative.   Social History Social History  Substance Use Topics  . Smoking status: Current Every Day Smoker -- 0.50 packs/day    Types: Cigarettes  . Smokeless tobacco: Never Used  . Alcohol Use: Yes     Comment: at least a pint plus every day     Family History Alcoholism   Prior to Admission medications   Medication Sig Start Date End Date Taking? Authorizing Provider  aspirin 81 MG EC tablet Take 81 mg by mouth every other day.  07/15/14  Yes Eileen Stanford, PA-C  bisoprolol (ZEBETA) 5 MG tablet Take 2 tablets (10 mg total) by mouth 2 (two) times daily. Patient taking differently: Take 10 mg by mouth at bedtime.  10/30/15  Yes Bonnielee Haff, MD  cholecalciferol (VITAMIN D) 1000 UNITS tablet Take 1,000 Units by mouth daily.    Yes Historical Provider, MD  enalapril (VASOTEC) 10 MG tablet Take 10 mg by mouth daily.    Yes Historical Provider, MD  folic acid (FOLVITE) 1 MG tablet Take 1 mg by mouth daily.  11/17/13  Yes Historical Provider, MD  furosemide (LASIX) 40 MG tablet Take 40 mg by  mouth daily.   Yes Historical Provider, MD  KLOR-CON M20 20 MEQ tablet Take 20 mEq by mouth daily. 11/17/13  Yes Historical Provider, MD  mometasone (NASONEX) 50 MCG/ACT nasal spray Place 2 sprays into the nose daily as needed. For nasal congestion   Yes Historical Provider, MD  nitroGLYCERIN (NITROSTAT) 0.4 MG SL tablet Place 0.4 mg under the tongue every 5 (five) minutes as needed for chest pain.   Yes Historical Provider, MD  omeprazole (PRILOSEC) 40 MG capsule Take 40 mg by mouth daily.   Yes Historical Provider, MD  pravastatin (PRAVACHOL) 20 MG tablet Take 20 mg by mouth daily.  05/18/15  Yes Historical Provider, MD  rivaroxaban (XARELTO) 20 MG TABS tablet Take 20 mg by mouth daily with supper.   Yes Carole Civil, MD  spironolactone (ALDACTONE) 25 MG tablet Take 1 tablet (25 mg total) by mouth daily. 09/03/14  Yes Rogelia Mire, NP  tetrahydrozoline-zinc (VISINE-AC) 0.05-0.25 % ophthalmic solution Place 2 drops into both eyes 3 (three) times daily as needed (redness relief).   Yes Historical Provider, MD  tiotropium (SPIRIVA) 18 MCG inhalation capsule Place 18 mcg into inhaler and inhale daily as needed (shortness of breath).    Yes Historical Provider, MD    No Known Allergies  Physical Exam  Vitals  Blood pressure 145/103, pulse 139, temperature 98.1 F (36.7 C), temperature source Oral, resp. rate 22, height 6' (1.829 m), weight 88.6 kg (195 lb 5.2 oz), SpO2 99 %.   1. General , frail elderly male laying in bed in NAD  2. Normal affect and insight, Not Suicidal or Homicidal, Awake Alert, Oriented X 3.  3. No F.N deficits, ALL C.Nerves Intact, Strength 5/5 all 4 extremities, Sensation intact all 4 extremities, Plantars down going.  4. Ears and Eyes appear Normal, Conjunctivae clear, PERRLA. Moist Oral Mucosa.  5. Supple Neck, No JVD, No cervical lymphadenopathy appriciated, No Carotid Bruits.  6. Symmetrical Chest wall movement, Good air movement bilaterally,  CTAB.  7. Tachycardic, No Gallops, Rubs or Murmurs, No Parasternal Heave.  8. Positive Bowel Sounds, Abdomen Soft, No tenderness, No organomegaly appriciated,No rebound -guarding or rigidity.  9.  No Cyanosis, Normal Skin Turgor, No Skin Rash or Bruise.  10. Good muscle tone,  joints appear normal , no effusions, Normal ROM.  11. No Palpable Lymph Nodes in Neck or Axillae    Data Review  CBC  Recent Labs Lab 12/16/15 1337 12/16/15 1414  WBC  --  16.2*  HGB 19.0* 14.5  HCT 56.0* 41.9  PLT  --  103*  MCV  --  97.4  MCH  --  33.7  MCHC  --  34.6  RDW  --  14.6  LYMPHSABS  --  0.8  MONOABS  --  2.0*  EOSABS  --  0.0  BASOSABS  --  0.0   ------------------------------------------------------------------------------------------------------------------  Chemistries   Recent Labs Lab 12/16/15 1337 12/16/15 1414 12/16/15 1548  NA 130* 135  --   K 4.2 2.8*  --   CL 90* 95*  --   CO2  --  21*  --   GLUCOSE 147* 129*  --   BUN 19 13  --   CREATININE 1.20 1.12  --   CALCIUM  --  8.8*  --   MG  --   --  0.7*  AST  --  73*  --   ALT  --  62  --   ALKPHOS  --  96  --   BILITOT  --  2.4*  --    ------------------------------------------------------------------------------------------------------------------ estimated creatinine clearance is 81.8 mL/min (by C-G formula based on Cr of 1.12). ------------------------------------------------------------------------------------------------------------------ No results for input(s): TSH, T4TOTAL, T3FREE, THYROIDAB in the last 72 hours.  Invalid input(s): FREET3   Coagulation profile No results for input(s): INR, PROTIME in the last 168 hours. ------------------------------------------------------------------------------------------------------------------- No results for input(s): DDIMER in the last 72  hours. -------------------------------------------------------------------------------------------------------------------  Cardiac Enzymes  Recent Labs Lab 12/16/15 1414  TROPONINI 0.05*   ------------------------------------------------------------------------------------------------------------------ Invalid input(s): POCBNP   ---------------------------------------------------------------------------------------------------------------  Urinalysis    Component Value Date/Time   COLORURINE AMBER* 10/25/2015 1450   APPEARANCEUR HAZY* 10/25/2015 1450   LABSPEC 1.010 10/25/2015 1450   PHURINE 5.5 10/25/2015 1450   GLUCOSEU NEGATIVE 10/25/2015 1450   HGBUR NEGATIVE 10/25/2015 1450   BILIRUBINUR SMALL* 10/25/2015 1450   KETONESUR NEGATIVE 10/25/2015 1450   PROTEINUR NEGATIVE 10/25/2015 1450   NITRITE NEGATIVE 10/25/2015 1450   LEUKOCYTESUR TRACE* 10/25/2015 1450    ----------------------------------------------------------------------------------------------------------------  Imaging results:   Dg Chest Port 1 View  12/16/2015  CLINICAL DATA:  Tachycardia EXAM: PORTABLE CHEST 1 VIEW COMPARISON:  10/25/2015 FINDINGS: Cardiac shadow is stable. A defibrillator is again seen and stable. The lungs are clear bilaterally. No acute bony abnormality is seen. IMPRESSION: No active disease. Electronically Signed   By: Inez Catalina M.D.   On: 12/16/2015 13:49      Assessment & Plan  Active Problems:   Essential hypertension   Cardiomyopathy ([place])   SYSTOLIC HEART FAILURE, CHRONIC   Ventricular tachycardia (HCC)   Cardiac defibrillator-Medtronic-single-chamber   Tachycardia   Alcohol abuse   Hypokalemia   Leukocytosis   Thrombocytopenia (HCC)   Tachycardia - Most likely sinus tachycardia as per EP evaluation, is multifactorial secondary to dehydration, alcohol abuse, and noncompliance with medication as it had not been taking his beta blockers for few days, will be admitted  to telemetry, continue with hydration. - We'll monitor volume status closely giving his known history of cardiac myopathy - Resume back on beta blockers, continue with IV Lopressor as needed per  EP recommendation  Alcohol abuse - We'll start on CIWA stepdown protocol, will consult social worker - Counseled  Nausea and vomiting - Benign abdominal exam, most likely related to gastroenteritis versus alcohol abuse - Continue with when necessary nausea medication  Elevated troponin - Likely demand ischemia secondary to his tachycardia, cycle troponins  History of chronic systolic CHF, and ischemic cardiopathy - Status post AICD, appears to be volume depleted, continue with gentle hydration, resume back on beta blockers  Hypokalemia/hypomagnesemia -  Repleted, recheck in a.m.  Obstructive sleep apnea - Noncompliant with C Pap.  Atrial fibrillation - Continue with beta blockers, resume on Xarelto, CHA2DS2-VASc Score of 3  History of PE - Continue with Xarelto  Thrombocytopenia - Continue to alcohol abuse, monitor closely  DVT Prophylaxis  Xarelto  AM Labs Ordered, also please review Full Orders  Family Communication: Admission, patients condition and plan of care including tests being ordered have been discussed with the patient and brother who indicate understanding and agree with the plan and Code Status.  Code Status full   Condition GUARDED    Time spent in minutes : 60 minutes    Arpita Fentress M.D on 12/16/2015 at 5:43 PM  Between 7am to 7pm - Pager - [phone]  After 7pm go to [email or web address] - password [email or web address]  And look for the night coverage person covering me after hours  [facility] Office  [phone]

## 2015-12-16 NOTE — ED Notes (Signed)
Pt to ED with c/o vomiting dark black looking vomitus this am -- last BM yesterday.

## 2015-12-16 NOTE — ED Notes (Signed)
Lab called -- all blood was hemolyzed-- will redraw.

## 2015-12-17 ENCOUNTER — Encounter: Payer: Self-pay | Admitting: *Deleted

## 2015-12-17 DIAGNOSIS — I10 Essential (primary) hypertension: Secondary | ICD-10-CM

## 2015-12-17 DIAGNOSIS — F1010 Alcohol abuse, uncomplicated: Secondary | ICD-10-CM

## 2015-12-17 DIAGNOSIS — Z95810 Presence of automatic (implantable) cardiac defibrillator: Secondary | ICD-10-CM

## 2015-12-17 DIAGNOSIS — I5022 Chronic systolic (congestive) heart failure: Secondary | ICD-10-CM

## 2015-12-17 LAB — RAPID URINE DRUG SCREEN, HOSP PERFORMED
Amphetamines: NOT DETECTED
Barbiturates: NOT DETECTED
Benzodiazepines: POSITIVE — AB
Cocaine: NOT DETECTED
OPIATES: NOT DETECTED
TETRAHYDROCANNABINOL: POSITIVE — AB

## 2015-12-17 LAB — BASIC METABOLIC PANEL
Anion gap: 9 (ref 5–15)
BUN: 14 mg/dL (ref 6–20)
CHLORIDE: 101 mmol/L (ref 101–111)
CO2: 25 mmol/L (ref 22–32)
Calcium: 8 mg/dL — ABNORMAL LOW (ref 8.9–10.3)
Creatinine, Ser: 0.87 mg/dL (ref 0.61–1.24)
GFR calc Af Amer: >60 mL/min (ref >60)
GLUCOSE: 77 mg/dL (ref 65–99)
POTASSIUM: 3.4 mmol/L — AB (ref 3.5–5.1)
Sodium: 135 mmol/L (ref 135–145)

## 2015-12-17 LAB — CBC
HEMATOCRIT: 36.1 % — AB (ref 39.0–52.0)
Hemoglobin: 11.8 g/dL — ABNORMAL LOW (ref 13.0–17.0)
MCH: 31.9 pg (ref 26.0–34.0)
MCHC: 32.7 g/dL (ref 30.0–36.0)
MCV: 97.6 fL (ref 78.0–100.0)
Platelets: 80 10*3/uL — ABNORMAL LOW (ref 150–400)
RBC: 3.7 MIL/uL — ABNORMAL LOW (ref 4.22–5.81)
RDW: 14.6 % (ref 11.5–15.5)
WBC: 12.8 10*3/uL — ABNORMAL HIGH (ref 4.0–10.5)

## 2015-12-17 LAB — URINALYSIS, ROUTINE W REFLEX MICROSCOPIC
GLUCOSE, UA: NEGATIVE mg/dL
HGB URINE DIPSTICK: NEGATIVE
Ketones, ur: 15 mg/dL — AB
Nitrite: POSITIVE — AB
PROTEIN: NEGATIVE mg/dL
Specific Gravity, Urine: 1.024 (ref 1.005–1.030)
pH: 5 (ref 5.0–8.0)

## 2015-12-17 LAB — URINE MICROSCOPIC-ADD ON

## 2015-12-17 LAB — TROPONIN I: Troponin I: 0.06 ng/mL — ABNORMAL HIGH (ref <0.031)

## 2015-12-17 LAB — MAGNESIUM: Magnesium: 1.6 mg/dL — ABNORMAL LOW (ref 1.7–2.4)

## 2015-12-17 MED ORDER — ENSURE ENLIVE PO LIQD
237.0000 mL | Freq: Two times a day (BID) | ORAL | Status: DC
  Administered 2015-12-19 – 2015-12-21 (×6): 237 mL via ORAL

## 2015-12-17 MED ORDER — POTASSIUM CHLORIDE CRYS ER 20 MEQ PO TBCR
40.0000 meq | EXTENDED_RELEASE_TABLET | Freq: Once | ORAL | Status: AC
  Administered 2015-12-17: 40 meq via ORAL
  Filled 2015-12-17: qty 2

## 2015-12-17 MED ORDER — MAGNESIUM SULFATE 2 GM/50ML IV SOLN
2.0000 g | Freq: Once | INTRAVENOUS | Status: AC
  Administered 2015-12-17: 2 g via INTRAVENOUS
  Filled 2015-12-17: qty 50

## 2015-12-17 NOTE — Progress Notes (Signed)
SUBJECTIVE: The patient is confused this morning.  He denies chest pain or shortness of breath. He is asking for tobacco   CURRENT MEDICATIONS: . aspirin EC  81 mg Oral QODAY  . bisoprolol  10 mg Oral BID  . cholecalciferol  1,000 Units Oral Daily  . folic acid  1 mg Oral Daily   Or  . folic acid  1 mg Intravenous Daily  . magnesium sulfate 1 - 4 g bolus IVPB  2 g Intravenous Once  . multivitamin with minerals  1 tablet Oral Daily  . pantoprazole  40 mg Oral Daily  . potassium chloride  40 mEq Oral Once  . pravastatin  20 mg Oral Daily  . rivaroxaban  20 mg Oral Q supper  . sodium chloride flush  3 mL Intravenous Q12H  . thiamine  100 mg Oral Daily   Or  . thiamine IV  100 mg Intravenous Daily   . 0.9 % NaCl with KCl 40 mEq / L 50 mL/hr (12/16/15 1836)    OBJECTIVE: Physical Exam: Filed Vitals:   12/16/15 1915 12/16/15 2304 12/17/15 0357 12/17/15 0734  BP: 116/86 105/80 122/96 129/84  Pulse: 122 98 105 112  Temp: 98.7 F (37.1 C) 98.3 F (36.8 C) 98.1 F (36.7 C) 98.3 F (36.8 C)  TempSrc: Oral Oral Oral Oral  Resp: 26 20 22 17   Height:      Weight:      SpO2: 95% 92% 97% 98%    Intake/Output Summary (Last 24 hours) at 12/17/15 [id number] Last data filed at 12/17/15 [id number]  Gross per 24 hour  Intake   4590 ml  Output    650 ml  Net   3940 ml    Telemetry reveals sinus tach  GEN- The patient is elderly and chronically ill appearing, alert to person and place but not time Head- normocephalic, atraumatic Eyes-  Sclera clear, conjunctiva pink Ears- hearing intact Oropharynx- clear Neck- supple Lungs- normal work of breathing, scattered rhonchi Heart- Regular rate and rhythm, no murmurs, rubs or gallops GI- soft, NT, ND, + BS Extremities- no clubbing, cyanosis, or edema Skin- no rash or lesion Psych- euthymic mood, full affect Neuro- strength and sensation are intact  LABS: Basic Metabolic Panel:  Recent Labs  12/16/15 1414 12/16/15 1548 12/17/15 0605   NA 135  --  135  K 2.8*  --  3.4*  CL 95*  --  101  CO2 21*  --  25  GLUCOSE 129*  --  77  BUN 13  --  14  CREATININE 1.12  --  0.87  CALCIUM 8.8*  --  8.0*  MG  --  0.7* 1.6*   Liver Function Tests:  Recent Labs  12/16/15 1414  AST 73*  ALT 62  ALKPHOS 96  BILITOT 2.4*  PROT 5.8*  ALBUMIN 2.1*    Recent Labs  12/16/15 1414  LIPASE 17   CBC:  Recent Labs  12/16/15 1414 12/17/15 0605  WBC 16.2* 12.8*  NEUTROABS 13.4*  --   HGB 14.5 11.8*  HCT 41.9 36.1*  MCV 97.4 97.6  PLT 103* 80*   Cardiac Enzymes:  Recent Labs  12/16/15 0950 12/16/15 1414 12/17/15 0605  TROPONINI 0.08* 0.05* 0.06*    RADIOLOGY: Dg Chest Port 1 View 12/16/2015  CLINICAL DATA:  Tachycardia EXAM: PORTABLE CHEST 1 VIEW COMPARISON:  10/25/2015 FINDINGS: Cardiac shadow is stable. A defibrillator is again seen and stable. The lungs are clear bilaterally. No acute bony  abnormality is seen. IMPRESSION: No active disease. Electronically Signed   By: Inez Catalina M.D.   On: 12/16/2015 13:49    ASSESSMENT AND PLAN:  Active Problems:   Essential hypertension   Cardiomyopathy ([place])   SYSTOLIC HEART FAILURE, CHRONIC   Ventricular tachycardia (HCC)   Cardiac defibrillator-Medtronic-single-chamber   Tachycardia   Alcohol abuse   Hypokalemia   Leukocytosis   Thrombocytopenia (HCC)  1. Sinus tachycardia Rates improved Continue BB Would discontinue IVF when able and encourage adequate po hydration with cardiomyopathy  2.  ICM/CAD No recent ischemic symptoms Troponin trend flat No need for ischemic eval at this time  3.  Paroxysmal atrial fibrillation Continue Xarelto for CHADS2VASC of 3 Maintaining SR currently  4.  ETOH abuse Management per IM  5.  OSA Non compliant with CPAP - states mask doesn't fit   Electrophysiology team to see as needed while here. Please call with questions.  Chanetta Marshall, NP 12/17/2015 8:10 AM  Pt interviwewd and examied Tele reviewed and appears  most consistent with sinus tach now slowing Will need to review issues of anticoagulation

## 2015-12-17 NOTE — Progress Notes (Signed)
[facility] PROGRESS NOTE  Stephen [name] [id number] DOB: 1961-08-12 DOA: 12/16/2015 PCP: [name], MD  Assessment/Plan: Sinus Tachycardia -appreciate EP input -due to ETOH intoxication/dehydration -improving -continue Bisoprolol, lopressor PRN - has ischemic cardiomyopathy  Alcohol abuse - heavy ETOH abuse for 30years, recurrent admissions for same -high risk of withdrawal -CIWA, thiamine, CSW consult  Nausea and vomiting - Benign abdominal exam, most likely related to gastritis - supportive care  Elevated troponin - Likely demand ischemia secondary to his tachycardia, cycle troponins -trend is flat, continue BB  History of chronic systolic CHF, and ischemic cardiopathy - Status post AICD, appears to be volume depleted, continue with gentle hydration, - resumed beta blockers  Hypokalemia/hypomagnesemia - give 2gm mag again and KCL PO.  Obstructive sleep apnea - Noncompliant with C Pap.  Atrial fibrillation - Continue with beta blockers, resume on Xarelto, CHA2DS2-VASc Score of 3  History of PE - Continue with Xarelto, monitor Plts closely  Thrombocytopenia - Continue to alcohol abuse, monitor closely  DVT Prophylaxis Xarelto Full Code Communication: none at bedside Dispo: Keep in SDU, home when stable   Subjective: Feels weak, no new complains  Objective: Filed Vitals:   12/17/15 0357 12/17/15 0734  BP: 122/96 129/84  Pulse: 105 112  Temp: 98.1 F (36.7 C) 98.3 F (36.8 C)  Resp: 22 17    Intake/Output Summary (Last 24 hours) at 12/17/15 0746 Last data filed at 12/17/15 0738  Gross per 24 hour  Intake   4590 ml  Output    650 ml  Net   3940 ml   Filed Weights   12/16/15 1312 12/16/15 1728  Weight: 88.451 kg (195 lb) 88.6 kg (195 lb 5.2 oz)    Exam:   General:  Drowsy, got some ativan, oriented to self, place  Cardiovascular: S1S2/RRR  Respiratory: diminished BS at bases  Abdomen: soft, NT, BS  present  Musculoskeletal: no edema c/c  Neuro: non focal, no asterizes   Data Reviewed: Basic Metabolic Panel:  Recent Labs Lab 12/16/15 0950 12/16/15 1337 12/16/15 1414 12/16/15 1548 12/17/15 0605  NA  --  130* 135  --  135  K  --  4.2 2.8*  --  3.4*  CL  --  90* 95*  --  101  CO2  --   --  21*  --  25  GLUCOSE  --  147* 129*  --  77  BUN  --  19 13  --  14  CREATININE 1.02 1.20 1.12  --  0.87  CALCIUM  --   --  8.8*  --  8.0*  MG  --   --   --  0.7* 1.6*   Liver Function Tests:  Recent Labs Lab 12/16/15 1414  AST 73*  ALT 62  ALKPHOS 96  BILITOT 2.4*  PROT 5.8*  ALBUMIN 2.1*    Recent Labs Lab 12/16/15 1414  LIPASE 17   No results for input(s): AMMONIA in the last 168 hours. CBC:  Recent Labs Lab 12/16/15 0950 12/16/15 1337 12/16/15 1414 12/17/15 0605  WBC 16.1*  --  16.2* 12.8*  NEUTROABS  --   --  13.4*  --   HGB 13.1 19.0* 14.5 11.8*  HCT 38.6* 56.0* 41.9 36.1*  MCV 97.0  --  97.4 97.6  PLT 92*  --  103* 80*   Cardiac Enzymes:  Recent Labs Lab 12/16/15 0950 12/16/15 1414 12/17/15 0605  TROPONINI 0.08* 0.05* 0.06*   BNP (last 3 results)  Recent Labs  10/25/15 1430  BNP 47.0    ProBNP (last 3 results) No results for input(s): PROBNP in the last 8760 hours.  CBG: No results for input(s): GLUCAP in the last 168 hours.  Recent Results (from the past 240 hour(s))  MRSA PCR Screening     Status: None   Collection Time: 12/16/15  6:57 PM  Result Value Ref Range Status   MRSA by PCR NEGATIVE NEGATIVE Final    Comment:        The GeneXpert MRSA Assay (FDA approved for NASAL specimens only), is one component of a comprehensive MRSA colonization surveillance program. It is not intended to diagnose MRSA infection nor to guide or monitor treatment for MRSA infections.      Studies: Dg Chest Port 1 View  12/16/2015  CLINICAL DATA:  Tachycardia EXAM: PORTABLE CHEST 1 VIEW COMPARISON:  10/25/2015 FINDINGS: Cardiac shadow is  stable. A defibrillator is again seen and stable. The lungs are clear bilaterally. No acute bony abnormality is seen. IMPRESSION: No active disease. Electronically Signed   By: Inez Catalina M.D.   On: 12/16/2015 13:49    Scheduled Meds: . aspirin EC  81 mg Oral QODAY  . bisoprolol  10 mg Oral BID  . cholecalciferol  1,000 Units Oral Daily  . folic acid  1 mg Oral Daily   Or  . folic acid  1 mg Intravenous Daily  . magnesium sulfate 1 - 4 g bolus IVPB  2 g Intravenous Once  . multivitamin with minerals  1 tablet Oral Daily  . pantoprazole  40 mg Oral Daily  . potassium chloride  40 mEq Oral Once  . pravastatin  20 mg Oral Daily  . rivaroxaban  20 mg Oral Q supper  . sodium chloride flush  3 mL Intravenous Q12H  . thiamine  100 mg Oral Daily   Or  . thiamine IV  100 mg Intravenous Daily   Continuous Infusions: . 0.9 % NaCl with KCl 40 mEq / L 50 mL/hr (12/16/15 1836)   Antibiotics Given (last 72 hours)    None      Active Problems:   Essential hypertension   Cardiomyopathy ([place])   SYSTOLIC HEART FAILURE, CHRONIC   Ventricular tachycardia (HCC)   Cardiac defibrillator-Medtronic-single-chamber   Tachycardia   Alcohol abuse   Hypokalemia   Leukocytosis   Thrombocytopenia ([place])    Time spent: 42min    Arraya Buck  [facility] Pager [phone]. If 7PM-7AM, please contact night-coverage at [email or web address], password [facility] 12/17/2015, 7:46 AM  LOS: 1 day

## 2015-12-17 NOTE — Progress Notes (Signed)
Initial Nutrition Assessment  DOCUMENTATION CODES:   Not applicable  INTERVENTION:    Ensure Enlive PO BID, each supplement provides 350 kcal and 20 grams of protein  NUTRITION DIAGNOSIS:   Inadequate oral intake related to poor appetite as evidenced by meal completion < 50%.  GOAL:   Patient will meet greater than or equal to 90% of their needs  MONITOR:   PO intake, Supplement acceptance, Labs, Weight trends  REASON FOR ASSESSMENT:   Malnutrition Screening Tool    ASSESSMENT:   55 y.o. male, with alcohol abuse, hypertension, hyperlipidemia, coronary artery disease status post AICD implementation, obstructive sleep apnea, PE on chronic Xarelto therapy, history of noncompliance with medication, patient presents with complaints of nausea and vomiting for last 3 days, as well as altered mental status drink half a gallon of whiskey over 48 hours.  Labs reviewed: potassium and magnesium are low. Receiving repletion.  Patient had no sleep last night per sitter, so he has been sleeping all day today. He consumed 15% of breakfast today, but none of lunch. He is going through withdrawal. He is on a heart healthy diet. Patient with a significant 20% weight loss over the past 6 months. Patient unable to provide any nutrition hx, however, suspect poor quality diet PTA given hx of alcohol abuse.   Nutrition focused physical exam completed.  No muscle or subcutaneous fat depletion noticed.  Diet Order:  Diet Heart Room service appropriate?: Yes; Fluid consistency:: Thin  Skin:  Reviewed, no issues  Last BM:  2/7  Height:   Ht Readings from Last 1 Encounters:  12/16/15 6' (1.829 m)    Weight:   Wt Readings from Last 1 Encounters:  12/16/15 195 lb 5.2 oz (88.6 kg)    Ideal Body Weight:  80.9 kg  BMI:  Body mass index is 26.49 kg/(m^2).  Estimated Nutritional Needs:   Kcal:  2200-2400  Protein:  110-125 gm  Fluid:  2.2-2.4 L  EDUCATION NEEDS:   No education needs  identified at this time  Molli Barrows, [place], [place], [place] Pager [phone] After Hours Pager [phone]

## 2015-12-17 NOTE — Care Management Note (Signed)
Case Management Note  Patient Details  Name: Stephen [name] MRN: [id number] Date of Birth: 1961-01-23  Subjective/Objective:    Patient from home alone, with n/v ams ,etoh, on ciwa.  NCM will cont to follow for dc needs.                Action/Plan:   Expected Discharge Date:                  Expected Discharge Plan:  Home/Self Care  In-House Referral:     Discharge planning Services  CM Consult  Post Acute Care Choice:    Choice offered to:     DME Arranged:    DME Agency:     HH Arranged:    HH Agency:     Status of Service:  In process, will continue to follow  Medicare Important Message Given:    Date Medicare IM Given:    Medicare IM give by:    Date Additional Medicare IM Given:    Additional Medicare Important Message give by:     If discussed at [place] of Stay Meetings, dates discussed:    Additional Comments:  Zenon Mayo, RN 12/17/2015, 4:35 PM

## 2015-12-17 NOTE — Progress Notes (Addendum)
Pt was bladder scanned this am: 56ml, later I&O cath: 450 ml dark orange urine. Paged Dr, new orders for Foley catheter. Will continue to monitor. Ambrose Pancoast, RN witness to Foley cath insertion [phone]

## 2015-12-18 DIAGNOSIS — D72829 Elevated white blood cell count, unspecified: Secondary | ICD-10-CM

## 2015-12-18 DIAGNOSIS — D696 Thrombocytopenia, unspecified: Secondary | ICD-10-CM

## 2015-12-18 LAB — CBC
HCT: 39.2 % (ref 39.0–52.0)
HEMOGLOBIN: 12.5 g/dL — AB (ref 13.0–17.0)
MCH: 32.1 pg (ref 26.0–34.0)
MCHC: 31.9 g/dL (ref 30.0–36.0)
MCV: 100.5 fL — ABNORMAL HIGH (ref 78.0–100.0)
PLATELETS: 77 10*3/uL — AB (ref 150–400)
RBC: 3.9 MIL/uL — AB (ref 4.22–5.81)
RDW: 14.7 % (ref 11.5–15.5)
WBC: 7.4 10*3/uL (ref 4.0–10.5)

## 2015-12-18 LAB — BASIC METABOLIC PANEL
Anion gap: 11 (ref 5–15)
BUN: 11 mg/dL (ref 6–20)
CHLORIDE: 103 mmol/L (ref 101–111)
CO2: 23 mmol/L (ref 22–32)
CREATININE: 0.8 mg/dL (ref 0.61–1.24)
Calcium: 8.5 mg/dL — ABNORMAL LOW (ref 8.9–10.3)
Glucose, Bld: 66 mg/dL (ref 65–99)
POTASSIUM: 3.7 mmol/L (ref 3.5–5.1)
SODIUM: 137 mmol/L (ref 135–145)

## 2015-12-18 NOTE — Progress Notes (Signed)
[facility] PROGRESS NOTE  Stephen [name] [id number] DOB: Sep 10, 1961 DOA: 12/16/2015 PCP: [name], MD  Assessment/Plan: Sinus Tachycardia -appreciate EP input -due to ETOH intoxication/dehydration -improving -continue Bisoprolol, lopressor PRN - has ischemic cardiomyopathy  Alcohol abuse/ETOH withdrawal - heavy ETOH abuse for 30years, recurrent admissions for same - continue Ativan per CIWA protocol -thiamine, Psych and CSW consult on Monday -wife reports 5 admissions since December for ETOH abuse/withdrawal at different hospitals  Nausea and vomiting - Benign abdominal exam, most likely related to gastritis - supportive care  Elevated troponin - Likely demand ischemia secondary to his tachycardia, cycle troponins -trend is flat, continue BB  History of chronic systolic CHF, and ischemic cardiopathy - Status post AICD, appears to be volume depleted, continue with gentle hydration, - resumed beta blockers  Hypokalemia/hypomagnesemia - give 2gm mag again and KCL PO.  Obstructive sleep apnea - Noncompliant with C Pap.  Atrial fibrillation - Continue with beta blockers, resume on Xarelto, CHA2DS2-VASc Score of 3 -monitor plts closely, will need to stop this if plts trend down further  History of PE - Continue with Xarelto, monitor Plts closely  Thrombocytopenia - Continue to alcohol abuse, monitor closely  DVT Prophylaxis Xarelto Full Code Communication: none at bedside, called and d/w wife Dispo: Keep in SDU, home when stable   Subjective: Sleeping, withdrawing, was getting ativan almost Q1-2h   Objective: Filed Vitals:   12/18/15 0358 12/18/15 0808  BP: 142/90 138/83  Pulse: 84 110  Temp: 98 F (36.7 C) 98.3 F (36.8 C)  Resp: 22 28    Intake/Output Summary (Last 24 hours) at 12/18/15 0916 Last data filed at 12/18/15 [id number]  Gross per 24 hour  Intake   1520 ml  Output   1300 ml  Net    220 ml   Filed Weights   12/16/15  1312 12/16/15 1728  Weight: 88.451 kg (195 lb) 88.6 kg (195 lb 5.2 oz)    Exam:   General:  Somnolent, arousable, no distress  Cardiovascular: S1S2/RRR  Respiratory: diminished BS at bases  Abdomen: soft, NT, BS present  Musculoskeletal: no edema c/c Neuro: non focal, no asterixes, mild tremors Data Reviewed: Basic Metabolic Panel:  Recent Labs Lab 12/16/15 0950 12/16/15 1337 12/16/15 1414 12/16/15 1548 12/17/15 0605 12/18/15 0606  NA  --  130* 135  --  135 137  K  --  4.2 2.8*  --  3.4* 3.7  CL  --  90* 95*  --  101 103  CO2  --   --  21*  --  25 23  GLUCOSE  --  147* 129*  --  77 66  BUN  --  19 13  --  14 11  CREATININE 1.02 1.20 1.12  --  0.87 0.80  CALCIUM  --   --  8.8*  --  8.0* 8.5*  MG  --   --   --  0.7* 1.6*  --    Liver Function Tests:  Recent Labs Lab 12/16/15 1414  AST 73*  ALT 62  ALKPHOS 96  BILITOT 2.4*  PROT 5.8*  ALBUMIN 2.1*    Recent Labs Lab 12/16/15 1414  LIPASE 17   No results for input(s): AMMONIA in the last 168 hours. CBC:  Recent Labs Lab 12/16/15 0950 12/16/15 1337 12/16/15 1414 12/17/15 0605 12/18/15 0606  WBC 16.1*  --  16.2* 12.8* 7.4  NEUTROABS  --   --  13.4*  --   --   HGB 13.1 19.0*  14.5 11.8* 12.5*  HCT 38.6* 56.0* 41.9 36.1* 39.2  MCV 97.0  --  97.4 97.6 100.5*  PLT 92*  --  103* 80* 77*   Cardiac Enzymes:  Recent Labs Lab 12/16/15 0950 12/16/15 1414 12/17/15 0605  TROPONINI 0.08* 0.05* 0.06*   BNP (last 3 results)  Recent Labs  10/25/15 1430  BNP 47.0    ProBNP (last 3 results) No results for input(s): PROBNP in the last 8760 hours.  CBG: No results for input(s): GLUCAP in the last 168 hours.  Recent Results (from the past 240 hour(s))  MRSA PCR Screening     Status: None   Collection Time: 12/16/15  6:57 PM  Result Value Ref Range Status   MRSA by PCR NEGATIVE NEGATIVE Final    Comment:        The GeneXpert MRSA Assay (FDA approved for NASAL specimens only), is one component  of a comprehensive MRSA colonization surveillance program. It is not intended to diagnose MRSA infection nor to guide or monitor treatment for MRSA infections.      Studies: Dg Chest Port 1 View  12/16/2015  CLINICAL DATA:  Tachycardia EXAM: PORTABLE CHEST 1 VIEW COMPARISON:  10/25/2015 FINDINGS: Cardiac shadow is stable. A defibrillator is again seen and stable. The lungs are clear bilaterally. No acute bony abnormality is seen. IMPRESSION: No active disease. Electronically Signed   By: Inez Catalina M.D.   On: 12/16/2015 13:49    Scheduled Meds: . aspirin EC  81 mg Oral QODAY  . bisoprolol  10 mg Oral BID  . cholecalciferol  1,000 Units Oral Daily  . feeding supplement (ENSURE ENLIVE)  237 mL Oral BID BM  . folic acid  1 mg Oral Daily   Or  . folic acid  1 mg Intravenous Daily  . multivitamin with minerals  1 tablet Oral Daily  . pantoprazole  40 mg Oral Daily  . pravastatin  20 mg Oral Daily  . rivaroxaban  20 mg Oral Q supper  . sodium chloride flush  3 mL Intravenous Q12H  . thiamine  100 mg Oral Daily   Or  . thiamine IV  100 mg Intravenous Daily   Continuous Infusions: . 0.9 % NaCl with KCl 40 mEq / L 50 mL/hr (12/18/15 0600)   Antibiotics Given (last 72 hours)    None      Active Problems:   Essential hypertension   Cardiomyopathy ([place])   SYSTOLIC HEART FAILURE, CHRONIC   Ventricular tachycardia (HCC)   Cardiac defibrillator-Medtronic-single-chamber   Tachycardia   Alcohol abuse   Hypokalemia   Leukocytosis   Thrombocytopenia ([place])    Time spent: 60min    Nussen Pullin  [facility] Pager [phone]. If 7PM-7AM, please contact night-coverage at [email or web address], password [facility] 12/18/2015, 9:16 AM  LOS: 2 days

## 2015-12-19 MED ORDER — ENALAPRIL MALEATE 5 MG PO TABS
5.0000 mg | ORAL_TABLET | Freq: Every day | ORAL | Status: DC
  Administered 2015-12-19 – 2015-12-21 (×3): 5 mg via ORAL
  Filled 2015-12-19 (×3): qty 1

## 2015-12-19 MED ORDER — CARVEDILOL 6.25 MG PO TABS
6.2500 mg | ORAL_TABLET | Freq: Two times a day (BID) | ORAL | Status: DC
  Administered 2015-12-19 – 2015-12-21 (×5): 6.25 mg via ORAL
  Filled 2015-12-19 (×5): qty 1

## 2015-12-19 NOTE — Progress Notes (Signed)
[facility] PROGRESS NOTE  Stephen [name] [id number] DOB: [birth year] DOA: 12/16/2015 PCP: [name], MD  Assessment/Plan: Sinus Tachycardia -appreciate EP input, improved -due to ETOH intoxication/dehydration -improving, cut down IVF -continue Bisoprolol, lopressor PRN -has ischemic cardiomyopathy  Alcohol abuse/ETOH withdrawal - heavy ETOH abuse for 30years, recurrent admissions for same - continue Ativan per CIWA protocol - thiamine, Psych and CSW consult on Monday - wife reports 5 admissions since December for ETOH abuse/withdrawal at different hospitals -showing interest in Rehab  Nausea and vomiting - Benign abdominal exam, most likely related to gastritis - supportive care  Elevated troponin - Likely demand ischemia secondary to his tachycardia, cycle troponins - trend is flat, continue BB  History of chronic systolic CHF, and ischemic cardiopathy - Status post AICD, appears to be volume depleted, continue with gentle hydration, - resumed beta blockers  Hypokalemia/hypomagnesemia - give 2gm mag again and KCL PO.  Obstructive sleep apnea - Noncompliant with C Pap.  Atrial fibrillation - Continue with beta blockers, resume on Xarelto, CHA2DS2-VASc Score of 3 -monitor plts closely, will need to stop this if plts trend down further  History of PE - Continue with Xarelto, monitor Plts closely  Thrombocytopenia - Continue to alcohol abuse, monitor closely  DVT Prophylaxis Xarelto Full Code Communication: none at bedside, called and d/w wife Dispo: Tx to floor  Subjective: Feels ok, no complaints  Objective: Filed Vitals:   12/19/15 0355 12/19/15 0824  BP: 142/91 131/95  Pulse: 97 95  Temp: 98.5 F (36.9 C) 98.1 F (36.7 C)  Resp: 24 31    Intake/Output Summary (Last 24 hours) at 12/19/15 1159 Last data filed at 12/19/15 1100  Gross per 24 hour  Intake    200 ml  Output    450 ml  Net   -250 ml   Filed Weights   12/16/15  1312 12/16/15 1728  Weight: 88.451 kg (195 lb) 88.6 kg (195 lb 5.2 oz)    Exam:   General:  Somnolent, arousable, no distress  Cardiovascular: S1S2/RRR  Respiratory: diminished BS at bases  Abdomen: soft, NT, BS present  Musculoskeletal: no edema c/c Neuro: non focal, no asterixes, mild tremors Data Reviewed: Basic Metabolic Panel:  Recent Labs Lab 12/16/15 0950 12/16/15 1337 12/16/15 1414 12/16/15 1548 12/17/15 0605 12/18/15 0606  NA  --  130* 135  --  135 137  K  --  4.2 2.8*  --  3.4* 3.7  CL  --  90* 95*  --  101 103  CO2  --   --  21*  --  25 23  GLUCOSE  --  147* 129*  --  77 66  BUN  --  19 13  --  14 11  CREATININE 1.02 1.20 1.12  --  0.87 0.80  CALCIUM  --   --  8.8*  --  8.0* 8.5*  MG  --   --   --  0.7* 1.6*  --    Liver Function Tests:  Recent Labs Lab 12/16/15 1414  AST 73*  ALT 62  ALKPHOS 96  BILITOT 2.4*  PROT 5.8*  ALBUMIN 2.1*    Recent Labs Lab 12/16/15 1414  LIPASE 17   No results for input(s): AMMONIA in the last 168 hours. CBC:  Recent Labs Lab 12/16/15 0950 12/16/15 1337 12/16/15 1414 12/17/15 0605 12/18/15 0606  WBC 16.1*  --  16.2* 12.8* 7.4  NEUTROABS  --   --  13.4*  --   --  HGB 13.1 19.0* 14.5 11.8* 12.5*  HCT 38.6* 56.0* 41.9 36.1* 39.2  MCV 97.0  --  97.4 97.6 100.5*  PLT 92*  --  103* 80* 77*   Cardiac Enzymes:  Recent Labs Lab 12/16/15 0950 12/16/15 1414 12/17/15 0605  TROPONINI 0.08* 0.05* 0.06*   BNP (last 3 results)  Recent Labs  10/25/15 1430  BNP 47.0    ProBNP (last 3 results) No results for input(s): PROBNP in the last 8760 hours.  CBG: No results for input(s): GLUCAP in the last 168 hours.  Recent Results (from the past 240 hour(s))  MRSA PCR Screening     Status: None   Collection Time: 12/16/15  6:57 PM  Result Value Ref Range Status   MRSA by PCR NEGATIVE NEGATIVE Final    Comment:        The GeneXpert MRSA Assay (FDA approved for NASAL specimens only), is one component  of a comprehensive MRSA colonization surveillance program. It is not intended to diagnose MRSA infection nor to guide or monitor treatment for MRSA infections.      Studies: No results found.  Scheduled Meds: . aspirin EC  81 mg Oral QODAY  . bisoprolol  10 mg Oral BID  . cholecalciferol  1,000 Units Oral Daily  . feeding supplement (ENSURE ENLIVE)  237 mL Oral BID BM  . folic acid  1 mg Oral Daily   Or  . folic acid  1 mg Intravenous Daily  . multivitamin with minerals  1 tablet Oral Daily  . pantoprazole  40 mg Oral Daily  . pravastatin  20 mg Oral Daily  . rivaroxaban  20 mg Oral Q supper  . sodium chloride flush  3 mL Intravenous Q12H  . thiamine  100 mg Oral Daily   Or  . thiamine IV  100 mg Intravenous Daily   Continuous Infusions: . 0.9 % NaCl with KCl 40 mEq / L 50 mL/hr (12/18/15 0600)   Antibiotics Given (last 72 hours)    None      Active Problems:   Essential hypertension   Cardiomyopathy ([place])   SYSTOLIC HEART FAILURE, CHRONIC   Ventricular tachycardia (HCC)   Cardiac defibrillator-Medtronic-single-chamber   Tachycardia   Alcohol abuse   Hypokalemia   Leukocytosis   Thrombocytopenia ([place])    Time spent: 24min    March Joos  [facility] Pager [phone]. If 7PM-7AM, please contact night-coverage at [email or web address], password [facility] 12/19/2015, 11:59 AM  LOS: 3 days  Is pink with we've gotten so much better we are really quite good but we've again

## 2015-12-20 DIAGNOSIS — F1994 Other psychoactive substance use, unspecified with psychoactive substance-induced mood disorder: Secondary | ICD-10-CM

## 2015-12-20 DIAGNOSIS — I472 Ventricular tachycardia: Secondary | ICD-10-CM

## 2015-12-20 LAB — BASIC METABOLIC PANEL
Anion gap: 7 (ref 5–15)
BUN: 7 mg/dL (ref 6–20)
CHLORIDE: 106 mmol/L (ref 101–111)
CO2: 24 mmol/L (ref 22–32)
Calcium: 8.8 mg/dL — ABNORMAL LOW (ref 8.9–10.3)
Creatinine, Ser: 0.73 mg/dL (ref 0.61–1.24)
GFR calc Af Amer: >60 mL/min (ref >60)
GFR calc non Af Amer: >60 mL/min (ref >60)
GLUCOSE: 133 mg/dL — AB (ref 65–99)
POTASSIUM: 3 mmol/L — AB (ref 3.5–5.1)
Sodium: 137 mmol/L (ref 135–145)

## 2015-12-20 LAB — CBC
HEMATOCRIT: 34.6 % — AB (ref 39.0–52.0)
Hemoglobin: 11.2 g/dL — ABNORMAL LOW (ref 13.0–17.0)
MCH: 31.9 pg (ref 26.0–34.0)
MCHC: 32.4 g/dL (ref 30.0–36.0)
MCV: 98.6 fL (ref 78.0–100.0)
Platelets: 113 10*3/uL — ABNORMAL LOW (ref 150–400)
RBC: 3.51 MIL/uL — ABNORMAL LOW (ref 4.22–5.81)
RDW: 14.7 % (ref 11.5–15.5)
WBC: 5.7 10*3/uL (ref 4.0–10.5)

## 2015-12-20 MED ORDER — LORAZEPAM 1 MG PO TABS: 0.0000 mg | ORAL_TABLET | Freq: Two times a day (BID) | ORAL | Status: DC

## 2015-12-20 MED ORDER — TRAZODONE HCL 50 MG PO TABS: 50.0000 mg | ORAL_TABLET | Freq: Every day | ORAL | Status: DC

## 2015-12-20 MED ORDER — POTASSIUM CHLORIDE CRYS ER 20 MEQ PO TBCR
40.0000 meq | EXTENDED_RELEASE_TABLET | Freq: Once | ORAL | Status: AC
Start: 2015-12-20 — End: 2015-12-20
  Administered 2015-12-20: 40 meq via ORAL
  Filled 2015-12-20: qty 2

## 2015-12-20 MED ORDER — VITAMIN B-1 100 MG PO TABS
100.0000 mg | ORAL_TABLET | Freq: Every day | ORAL | Status: DC
  Administered 2015-12-20 – 2015-12-21 (×2): 100 mg via ORAL
  Filled 2015-12-20 (×2): qty 1

## 2015-12-20 MED ORDER — LORAZEPAM 2 MG/ML IJ SOLN: 0.0000 mg | Freq: Four times a day (QID) | INTRAMUSCULAR | Status: DC

## 2015-12-20 MED ORDER — LORAZEPAM 1 MG PO TABS: 0.0000 mg | ORAL_TABLET | Freq: Four times a day (QID) | ORAL | Status: DC | PRN

## 2015-12-20 MED ORDER — POTASSIUM CHLORIDE 20 MEQ/15ML (10%) PO SOLN
40.0000 meq | Freq: Every day | ORAL | Status: DC
  Administered 2015-12-20 – 2015-12-21 (×2): 40 meq via ORAL
  Filled 2015-12-20 (×2): qty 30

## 2015-12-20 MED ORDER — FLUOXETINE HCL 20 MG PO CAPS
20.0000 mg | ORAL_CAPSULE | Freq: Every day | ORAL | Status: DC
  Administered 2015-12-20 – 2015-12-21 (×2): 20 mg via ORAL
  Filled 2015-12-20 (×2): qty 1

## 2015-12-20 NOTE — Progress Notes (Signed)
[facility] PROGRESS NOTE  Stephen [name] [id number] DOB: [birth year] DOA: 12/16/2015 PCP: Stephen Mustache, MD  Assessment/Plan:  Alcohol abuse/ETOH withdrawal - heavy ETOH abuse for 30years, recurrent admissions for same - treated with Ativan per CIWA protocol - thiamine, Psych and CSW consulted  - wife reports 5 admissions since December for ETOH abuse/withdrawal at different hospitals - pt reports interest in ETOH Rehab  Sinus Tachycardia -appreciate EP input, improved -due to ETOH intoxication/dehydration -improving, cut down IVF -changed Bisoprolol to Coreg, has ischemic cardiomyopathy  Nausea and vomiting - Benign abdominal exam, most likely related to gastritis - supportive care, resolved  Elevated troponin - Likely demand ischemia secondary to his tachycardia, cycle troponins - trend is flat, continue BB  History of chronic systolic CHF, and ischemic cardiopathy - Status post AICD, appears to be volume depleted, continue with gentle hydration, - resumed beta blockers, ACE  Hypokalemia/hypomagnesemia - give 2gm mag again and KCL PO.  Obstructive sleep apnea - Noncompliant with C Pap.  Atrial fibrillation - Continue with beta blockers, resume on Xarelto, CHA2DS2-VASc Score of 3 - plts improving, will need this monitored closely and Stop Xarelto if plts <100K  History of PE - Continue with Xarelto, monitor Plts closely  Thrombocytopenia - Continue to alcohol abuse, monitor closely  DVT Prophylaxis Xarelto Full Code Communication: none at bedside, called and d/w wife 2 days ago Dispo: Tx to floor, home later today or in am  Subjective: Feels ok, no complaints  Objective: Filed Vitals:   12/20/15 1124 12/20/15 1245  BP: 114/83   Pulse: 83   Temp:  97.9 F (36.6 C)  Resp: 14 26    Intake/Output Summary (Last 24 hours) at 12/20/15 1347 Last data filed at 12/20/15 1339  Gross per 24 hour  Intake    480 ml  Output    725 ml  Net    -245 ml   Filed Weights   12/16/15 1312 12/16/15 1728  Weight: 88.451 kg (195 lb) 88.6 kg (195 lb 5.2 oz)    Exam:   General:  Somnolent, arousable, no distress  Cardiovascular: S1S2/RRR  Respiratory: diminished BS at bases  Abdomen: soft, NT, BS present  Musculoskeletal: no edema c/c Neuro: non focal, no asterixes, mild tremors Data Reviewed: Basic Metabolic Panel:  Recent Labs Lab 12/16/15 1337 12/16/15 1414 12/16/15 1548 12/17/15 0605 12/18/15 0606 12/20/15 0449  NA 130* 135  --  135 137 137  K 4.2 2.8*  --  3.4* 3.7 3.0*  CL 90* 95*  --  101 103 106  CO2  --  21*  --  25 23 24   GLUCOSE 147* 129*  --  77 66 133*  BUN 19 13  --  14 11 7   CREATININE 1.20 1.12  --  0.87 0.80 0.73  CALCIUM  --  8.8*  --  8.0* 8.5* 8.8*  MG  --   --  0.7* 1.6*  --   --    Liver Function Tests:  Recent Labs Lab 12/16/15 1414  AST 73*  ALT 62  ALKPHOS 96  BILITOT 2.4*  PROT 5.8*  ALBUMIN 2.1*    Recent Labs Lab 12/16/15 1414  LIPASE 17   No results for input(s): AMMONIA in the last 168 hours. CBC:  Recent Labs Lab 12/16/15 0950 12/16/15 1337 12/16/15 1414 12/17/15 0605 12/18/15 0606 12/20/15 0449  WBC 16.1*  --  16.2* 12.8* 7.4 5.7  NEUTROABS  --   --  13.4*  --   --   --  HGB 13.1 19.0* 14.5 11.8* 12.5* 11.2*  HCT 38.6* 56.0* 41.9 36.1* 39.2 34.6*  MCV 97.0  --  97.4 97.6 100.5* 98.6  PLT 92*  --  103* 80* 77* 113*   Cardiac Enzymes:  Recent Labs Lab 12/16/15 0950 12/16/15 1414 12/17/15 0605  TROPONINI 0.08* 0.05* 0.06*   BNP (last 3 results)  Recent Labs  10/25/15 1430  BNP 47.0    ProBNP (last 3 results) No results for input(s): PROBNP in the last 8760 hours.  CBG: No results for input(s): GLUCAP in the last 168 hours.  Recent Results (from the past 240 hour(s))  MRSA PCR Screening     Status: None   Collection Time: 12/16/15  6:57 PM  Result Value Ref Range Status   MRSA by PCR NEGATIVE NEGATIVE Final    Comment:        The  GeneXpert MRSA Assay (FDA approved for NASAL specimens only), is one component of a comprehensive MRSA colonization surveillance program. It is not intended to diagnose MRSA infection nor to guide or monitor treatment for MRSA infections.      Studies: No results found.  Scheduled Meds: . aspirin EC  81 mg Oral QODAY  . carvedilol  6.25 mg Oral BID WC  . cholecalciferol  1,000 Units Oral Daily  . enalapril  5 mg Oral Daily  . feeding supplement (ENSURE ENLIVE)  237 mL Oral BID BM  . folic acid  1 mg Oral Daily   Or  . folic acid  1 mg Intravenous Daily  . multivitamin with minerals  1 tablet Oral Daily  . pantoprazole  40 mg Oral Daily  . potassium chloride  40 mEq Oral Daily  . pravastatin  20 mg Oral Daily  . rivaroxaban  20 mg Oral Q supper  . sodium chloride flush  3 mL Intravenous Q12H  . thiamine  100 mg Oral Daily   Or  . thiamine IV  100 mg Intravenous Daily   Continuous Infusions:   Antibiotics Given (last 72 hours)    None      Active Problems:   Essential hypertension   Cardiomyopathy ([place])   SYSTOLIC HEART FAILURE, CHRONIC   Ventricular tachycardia ([facility])   Cardiac defibrillator-Medtronic-single-chamber   Tachycardia   Alcohol abuse   Hypokalemia   Leukocytosis   Thrombocytopenia ([place])    Time spent: 21min    Stephen [name]  [facility] Pager [phone]. If 7PM-7AM, please contact night-coverage at [email or web address], password [facility] 12/20/2015, 1:47 PM  LOS: 4 days  Is pink with we've gotten so much better we are really quite good but we've again

## 2015-12-20 NOTE — Consult Note (Signed)
[facility]iat[facility] Consult   Reason for Consult:  Alcohol abuse and multiple medical admisison Referring Physician:  Dr. Broadus John Patient Identification: Stephen [name] MRN:  [id number] Principal Diagnosis: Alcohol abuse Diagnosis:   Patient Active Problem List   Diagnosis Date Noted  . Tachycardia [R00.0] 12/16/2015  . SVT (supraventricular tachycardia) ([place]) [I47.1] 12/16/2015  . Alcohol abuse [F10.10] 12/16/2015  . Hypokalemia [E87.6] 12/16/2015  . Leukocytosis [D72.829] 12/16/2015  . Thrombocytopenia ([place]) [D69.6] 12/16/2015  . Acute encephalopathy [G93.40] 10/28/2015  . Alcohol withdrawal ([place]) [F10.239] 10/25/2015  . Abdominal pain, right upper quadrant [R10.11] 10/25/2015  . Constipation [K59.00] 10/25/2015  . Ischemic cardiomyopathy [I25.5] 09/02/2014  . Cardiomyopathy, ischemic [I25.5] 07/15/2014  . Atypical chest pain [R07.89] 07/15/2014  . Hemarthrosis [M25.00] 12/10/2013  . Left knee pain [M25.562] 12/10/2013  . Ventricular tachycardia ([place]) [I47.2] 08/08/2011  . Cardiac defibrillator-Medtronic-single-chamber [O29.476] 08/08/2011  . 5465 lead [K35.9XXA] 08/08/2011  . HYPERLIPIDEMIA-MIXED [E78.5] 05/26/2009  . OVERWEIGHT/OBESITY [E66.3] 05/26/2009  . Essential hypertension [I10] 05/26/2009  . CAD, NATIVE VESSEL [I25.10] 05/26/2009  . Cardiomyopathy ([place]) [I42.9] 05/26/2009  . SYSTOLIC HEART FAILURE, CHRONIC [I50.22] 05/26/2009    Total Time spent with patient: 1 hour  Subjective:   Stephen [name] is a 55 y.o. male patient admitted with depression, insomnia and alcohol abuse versus dependence.  HPI:  Stephen [name] is a 55 y.o. Male seen, chart reviewed and case discussed with Dr. Broadus John for face-to-face psychiatric consultation and evaluation of substance induced mood disorder, alcohol dependence and recent intoxication and also has multiple recent detox treatments. Patient reported he has been drinking 1 pint of whiskey and 8-9 cans of beer daily for the  last 10 years and recently his drinking is out of control. Patient reported his wife is at the verge of divorced him and has been separated from them for the last one and half years because of his drinking heavily. 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 Patient requested rehabilitation therapy close approximately from home. Patient has it to children who are grown up. Patient has a family history her father calling his brother. Patient has no previous acute psychiatric hospitalizations. Patient denies current suicidal/homicidal ideation and has no evidence of psychosis.  Medical history: Patient with alcohol abuse, hypertension, hyperlipidemia, coronary artery disease status post AICD implementation, obstructive sleep apnea, PE on chronic Xarelto therapy, history of noncompliance with medication.   Past Psychiatric History: none reported  Risk to Self: Is patient at risk for suicide?: No Risk to Others:   Prior Inpatient Therapy:   Prior Outpatient Therapy:    Past Medical History:  Past Medical History  Diagnosis Date  . Obesity   . Hypertension   . Hyperlipidemia   . Systolic heart failure   . Coronary artery disease     native vessel  . AICD (automatic cardioverter/defibrillator) present 2006    [place] 7232  . Ischemic cardiomyopathy     ischemic  . OSA (obstructive sleep apnea)   . [place] Paroxysmal ventricular tachycardia ([place])     Rx via [facility] 2008  . Pulmonary embolism ([place])   . MI, old     Past Surgical History  Procedure Laterality Date  . Cardiac defibrillator placement  2006    Medtronic Maximo 7232  . Knee surgery      left   . Implantable cardioverter defibrillator generator  change N/A 09/02/2014    Procedure: IMPLANTABLE CARDIOVERTER DEFIBRILLATOR GENERATOR CHANGE;  Surgeon: Deboraha Sprang, MD;  Location: [facility] CATH  LAB;  Service: Cardiovascular;  Laterality: N/A;  . Lead revision N/A 09/02/2014    Procedure: LEAD REVISION;  Surgeon: Deboraha Sprang, MD;  Location[facility] CATH LAB;  Service: Cardiovascular;  Laterality: N/A;   Family History: No family history on file. Family Psychiatric  History: Significant for heart problems and alcohol abuse versus dependence  Social History:  History  Alcohol Use  . Yes    Comment: at least a pint plus every day     History  Drug Use  . Yes  . Special: Marijuana    Comment: last used 11/06/13    Social History   Social History  . Marital Status: Married    Spouse Name: N/A  . Number of Children: N/A  . Years of Education: N/A   Social History Main Topics  . Smoking status: Current Every Day Smoker -- 0.50 packs/day    Types: Cigarettes  . Smokeless tobacco: Never Used  . Alcohol Use: Yes     Comment: at least a pint plus every day  . Drug Use: Yes    Special: Marijuana     Comment: last used 11/06/13  . Sexual Activity: Not Asked   Other Topics Concern  . None   Social History Narrative   Additional Social History:    Allergies:  No Known Allergies  Labs:  Results for orders placed or performed during the hospital encounter of 12/16/15 (from the past 48 hour(s))  Basic metabolic panel     Status: Abnormal   Collection Time: 12/20/15  4:49 AM  Result Value Ref Range   Sodium 137 135 - 145 mmol/L   Potassium 3.0 (L) 3.5 - 5.1 mmol/L    Comment: DELTA CHECK NOTED   Chloride 106 101 - 111 mmol/L   CO2 24 22 - 32 mmol/L   Glucose, Bld 133 (H) 65 - 99 mg/dL   BUN 7 6 - 20 mg/dL   Creatinine, Ser 0.73 0.61 - 1.24 mg/dL   Calcium 8.8 (L) 8.9 - 10.3 mg/dL   GFR calc non Af Amer >60 >60 mL/min   GFR calc Af Amer >60 >60 mL/min    Comment: (NOTE) The eGFR has been calculated using the CKD EPI equation. This calculation has not been validated in all clinical situations. eGFR's persistently <60 mL/min signify possible Chronic Kidney Disease.     Anion gap 7 5 - 15  CBC     Status: Abnormal   Collection Time: 12/20/15  4:49 AM  Result Value Ref Range   WBC 5.7 4.0 - 10.5 K/uL   RBC 3.51 (L) 4.22 - 5.81 MIL/uL   Hemoglobin 11.2 (L) 13.0 - 17.0 g/dL   HCT 34.6 (L) 39.0 - 52.0 %   MCV 98.6 78.0 - 100.0 fL   MCH 31.9 26.0 - 34.0 pg   MCHC 32.4 30.0 - 36.0 g/dL   RDW 14.7 11.5 - 15.5 %   Platelets 113 (L) 150 - 400 K/uL    Comment: CONSISTENT WITH PREVIOUS RESULT    Current Facility-Administered Medications  Medication Dose Route Frequency Provider Last Rate Last Dose  . albuterol (PROVENTIL) (2.5 MG/3ML) 0.083% nebulizer solution 2.5 mg  2.5 mg Nebulization Q2H PRN Albertine Patricia, MD      . aspirin EC tablet 81 mg  81 mg Oral QODAY Albertine Patricia, MD   81 mg  at 12/19/15 1001  . carvedilol (COREG) tablet 6.25 mg  6.25 mg Oral BID WC Domenic Polite, MD   6.25 mg at 12/20/15 0733  . cholecalciferol (VITAMIN D) tablet 1,000 Units  1,000 Units Oral Daily Albertine Patricia, MD   1,000 Units at 12/20/15 0902  . enalapril (VASOTEC) tablet 5 mg  5 mg Oral Daily Domenic Polite, MD   5 mg at 12/20/15 0902  . feeding supplement (ENSURE ENLIVE) (ENSURE ENLIVE) liquid 237 mL  237 mL Oral BID BM Domenic Polite, MD   237 mL at 12/20/15 0905  . folic acid (FOLVITE) tablet 1 mg  1 mg Oral Daily Silver Huguenin Elgergawy, MD   1 mg at 56/81/27 5170   Or  . folic acid injection 1 mg  1 mg Intravenous Daily Silver Huguenin Elgergawy, MD      . LORazepam (ATIVAN) injection 2-3 mg  2-3 mg Intravenous Q1H PRN Albertine Patricia, MD   3 mg at 12/18/15 0500  . metoprolol (LOPRESSOR) injection 2.5 mg  2.5 mg Intravenous Q6H PRN Albertine Patricia, MD   2.5 mg at 12/16/15 1841  . multivitamin with minerals tablet 1 tablet  1 tablet Oral Daily Albertine Patricia, MD   1 tablet at 12/20/15 0904  . naphazoline-glycerin (CLEAR EYES) ophth solution 2 drop  2 drop Both Eyes QID PRN Silver Huguenin Elgergawy, MD      . nitroGLYCERIN (NITROSTAT) SL tablet 0.4 mg  0.4 mg  Sublingual Q5 min PRN Albertine Patricia, MD      . pantoprazole (PROTONIX) EC tablet 40 mg  40 mg Oral Daily Albertine Patricia, MD   40 mg at 12/20/15 0903  . potassium chloride SA (K-DUR,KLOR-CON) CR tablet 40 mEq  40 mEq Oral Once Domenic Polite, MD      . pravastatin (PRAVACHOL) tablet 20 mg  20 mg Oral Daily Albertine Patricia, MD   20 mg at 12/20/15 0902  . rivaroxaban (XARELTO) tablet 20 mg  20 mg Oral Q supper Albertine Patricia, MD   20 mg at 12/19/15 1654  . sodium chloride flush (NS) 0.9 % injection 3 mL  3 mL Intravenous Q12H Albertine Patricia, MD   3 mL at 12/20/15 1000  . thiamine (VITAMIN B-1) tablet 100 mg  100 mg Oral Daily Albertine Patricia, MD   100 mg at 12/18/15 1100   Or  . thiamine (B-1) injection 100 mg  100 mg Intravenous Daily Albertine Patricia, MD   100 mg at 12/19/15 1402  . tiotropium (SPIRIVA) inhalation capsule 18 mcg  18 mcg Inhalation Daily PRN Albertine Patricia, MD        Musculoskeletal: Strength & Muscle Tone: decreased Gait & Station: unable to stand Patient leans: N/A  Psychiatric Specialty Exam: ROS patient has mild anxiety, depression, insomnia, nausea and vomiting but no sweating shaking, shivering, shortness of breath and chest pain. No Fever-chills, No Headache, No changes with Vision or hearing, reports vertigo No problems swallowing food or Liquids, No Chest pain, Cough or Shortness of Breath, No Abdominal pain, No Nausea or Vommitting, Bowel movements are regular, No Blood in stool or Urine, No dysuria, No new skin rashes or bruises, No new joints pains-aches,  No new weakness, tingling, numbness in any extremity, No recent weight gain or loss, No polyuria, polydypsia or polyphagia,   A full 10 point Review of Systems was done, except as stated above, all other Review of Systems were negative.  Blood  pressure 160/103, pulse 101, temperature 98 F (36.7 C), temperature source Oral, resp. rate 21, height 6' (1.829 m), weight 88.6 kg  (195 lb 5.2 oz), SpO2 96 %.Body mass index is 26.49 kg/(m^2).  General Appearance: Casual  Eye Contact::  Good  Speech:  Clear and Coherent and Slow  Volume:  Decreased  Mood:  Anxious and Depressed  Affect:  Constricted and Depressed  Thought Process:  Coherent and Goal Directed  Orientation:  Full (Time, Place, and Person)  Thought Content:  WDL and Rumination  Suicidal Thoughts:  No  Homicidal Thoughts:  No  Memory:  Immediate;   Good Recent;   Fair Remote;   Fair  Judgement:  Impaired  Insight:  Fair  Psychomotor Activity:  Decreased  Concentration:  Fair  Recall:  Good  Fund of Knowledge:Good  Language: Good  Akathisia:  Negative  Handed:  Right  AIMS (if indicated):     Assets:  Communication Skills Desire for Improvement Financial Resources/Insurance Housing Leisure Time Resilience Social Support Transportation  ADL's:  Intact  Cognition: WNL  Sleep:      Treatment Plan Summary: Patient has been suffering with chronic alcohol abuse versus dependence and multiple detox treatments and currently recovering from detox. Patient also reported depression and anxiety and worried about his wife is going to divorce him completely. Patient is willing to participate in substance abuse rehabilitation as Alcoholics Anonymous is not helping him to stay sober We start fluoxetine 20 mg daily for depression and trazodone 50 mg at bedtime for insomnia Continue supportive therapy Case discussed with the psychiatric social service regarding placement and residential substance abuse treatment Center which is patient is willing to participate voluntarily.  Disposition: Patient does not meet criteria for psychiatric inpatient admission. Supportive therapy provided about ongoing stressors.  Durward Parcel., MD 12/20/2015 11:08 AM

## 2015-12-20 NOTE — Progress Notes (Signed)
Report given to Nellie, RN for area patient is to transfer to.

## 2015-12-20 NOTE — Care Management Note (Signed)
Case Management Note  Patient Details  Name: Stephen [name] MRN: [id number] Date of Birth: 03-27-1961  Subjective/Objective:    Patient lives home alone, per pt eval rec SNF, NCM informed CSW of this recommendation.  NCM will cont to follow for dc needs.                Action/Plan:   Expected Discharge Date:                  Expected Discharge Plan:  Skilled Nursing Facility  In-House Referral:  Clinical Social Work  Discharge planning Services  CM Consult  Post Acute Care Choice:    Choice offered to:     DME Arranged:    DME Agency:     HH Arranged:    [place] Agency:     Status of Service:  In process, will continue to follow  Medicare Important Message Given:  Yes Date Medicare IM Given:    Medicare IM give by:    Date Additional Medicare IM Given:    Additional Medicare Important Message give by:     If discussed at [place] of Stay Meetings, dates discussed:    Additional Comments:  Zenon Mayo, RN 12/20/2015, 3:01 PM

## 2015-12-20 NOTE — Care Management Important Message (Signed)
Important Message  Patient Details  Name: Stephen [name] MRN: [id number] Date of Birth: 1961/05/12   Medicare Important Message Given:  Yes    [name] [name] 12/20/2015, 2:09 PM

## 2015-12-20 NOTE — Evaluation (Signed)
Physical Therapy Evaluation Patient Details Name: Stephen [name] MRN: [id number] DOB: Feb 10, 1961 Today's Date: 12/20/2015   History of Present Illness  Pt is a 55 y/o M admitted w/ c/o nausea and vomiting as well as AMS drinking half a gallon of whiskey over 48 hours.  Pt is tachycardic upon admission.  Pt's PMH includes alcohol abuse, CAD s/p AICD implementation, PE, MI, systolic heart failure.  Clinical Impression  Pt admitted with above diagnosis. Pt currently with functional limitations due to the deficits listed below (see PT Problem List). Stephen [name] presents w/ impaired cognition and balance as well as generalized weakness and a decreased awareness of his safety/deficits.  He will benefit from SNF as he lives along and currently requires assist for all aspects of mobility. Pt additionally expresses interest in going to inpatient rehabilitation to address his alcohol abuse.  If he d/c to this location he will benefit from HHPT, if available.  Pt will benefit from skilled PT to increase their independence and safety with mobility to allow discharge to the venue listed below.      Follow Up Recommendations SNF;Supervision/Assistance - 24 hour    Equipment Recommendations  Rolling walker with 5" wheels    Recommendations for Other Services       Precautions / Restrictions Precautions Precautions: Fall Precaution Comments: Lt knee buckle (h/o Lt knee arthritis w/ buckle per pt) Restrictions Weight Bearing Restrictions: No      Mobility  Bed Mobility Overal bed mobility: Needs Assistance Bed Mobility: Sit to Supine       Sit to supine: Min guard   General bed mobility comments: Increased time.  Pt standing at bedside w/ nurse tech upon PT arrival  Transfers Overall transfer level: Needs assistance Equipment used: Rolling walker (2 wheeled) Transfers: Sit to/from Stand Sit to Stand: Min assist;+2 physical assistance         General transfer comment: Assist to steady and  to boost up to standing.  Cues for proper use of RW and hand placement.  Ambulation/Gait Ambulation/Gait assistance: Min assist;+2 safety/equipment Ambulation Distance (Feet): 80 Feet Assistive device: Rolling walker (2 wheeled) Gait Pattern/deviations: Step-through pattern;Decreased stride length;Antalgic;Trunk flexed;Drifts right/left   Gait velocity interpretation: Below normal speed for age/gender General Gait Details: Pt drifts Lt and Rt and requires assist to direct/manage RW along w/ verbal cues to stand upright.  Lt knee buckle noted at end of ambulation which pt reports occurs at baseline.  Stairs            Wheelchair Mobility    Modified Rankin (Stroke Patients Only)       Balance Overall balance assessment: Needs assistance;History of Falls (pt laughs and says he falls multiple times each week) Sitting-balance support: Bilateral upper extremity supported;Feet supported Sitting balance-Leahy Scale: Fair     Standing balance support: Bilateral upper extremity supported;During functional activity Standing balance-Leahy Scale: Poor Standing balance comment: Relies on RW and assist for support                             Pertinent Vitals/Pain Pain Assessment: No/denies pain    Home Living Family/patient expects to be discharged to:: Skilled nursing facility (or inpatient rehabilitation to address alcohol abuse) Living Arrangements: Alone Available Help at Discharge: Family;Friend(s);Available PRN/intermittently ("I dont' want to brag but I have people") Type of Home: House Home Access: Stairs to enter Entrance Stairs-Rails: Can reach both;Left;Right Entrance Stairs-Number of Steps: 3 Home Layout: One level  Home Equipment: Kasandra Knudsen - single point      Prior Function Level of Independence: Independent with assistive device(s)         Comments: Uses cane at all times, says he has a "driver"     Hand Dominance        Extremity/Trunk  Assessment   Upper Extremity Assessment: Overall WFL for tasks assessed           Lower Extremity Assessment: Generalized weakness      Cervical / Trunk Assessment: Kyphotic  Communication   Communication: No difficulties  Cognition Arousal/Alertness: Lethargic;Suspect due to medications Behavior During Therapy: Flat affect Overall Cognitive Status: No family/caregiver present to determine baseline cognitive functioning Area of Impairment: Orientation;Attention;Memory;Safety/judgement;Awareness Orientation Level: Disoriented to;Situation (pt asking why he is here and laughing) Current Attention Level: Sustained Memory: Decreased short-term memory   Safety/Judgement: Decreased awareness of safety;Decreased awareness of deficits Awareness: Emergent   General Comments: Conversation tangential at times and requires redirecting    General Comments      Exercises        Assessment/Plan    PT Assessment Patient needs continued PT services  PT Diagnosis Difficulty walking;Generalized weakness   PT Problem List Decreased strength;Decreased activity tolerance;Decreased balance;Decreased mobility;Decreased cognition;Decreased knowledge of use of DME;Decreased safety awareness  PT Treatment Interventions DME instruction;Gait training;Stair training;Functional mobility training;Therapeutic activities;Therapeutic exercise;Balance training;Neuromuscular re-education;Cognitive remediation;Patient/family education   PT Goals (Current goals can be found in the Care Plan section) Acute Rehab PT Goals Patient Stated Goal: to go to rehab, "I need to quit drinking" PT Goal Formulation: With patient Time For Goal Achievement: 01/03/16 Potential to Achieve Goals: Fair    Frequency Min 2X/week   Barriers to discharge Inaccessible home environment;Decreased caregiver support lives alone and has steps to enter home    Co-evaluation               End of Session Equipment Utilized  During Treatment: Gait belt Activity Tolerance: Patient tolerated treatment well;Patient limited by fatigue Patient left: in bed;with call bell/phone within reach;with bed alarm set Nurse Communication: Mobility status;Other (comment) (Lt knee buckle)         Time: 1141-1200 PT Time Calculation (min) (ACUTE ONLY): 19 min   Charges:   PT Evaluation $PT Eval Moderate Complexity: 1 Procedure     PT G Codes:       Joslyn Hy PT, DPT [phone] Pager: [phone] 12/20/2015, 1:49 PM

## 2015-12-21 ENCOUNTER — Inpatient Hospital Stay (HOSPITAL_COMMUNITY): Payer: Medicare Other

## 2015-12-21 LAB — CBC
HCT: 34 % — ABNORMAL LOW (ref 39.0–52.0)
Hemoglobin: 10.9 g/dL — ABNORMAL LOW (ref 13.0–17.0)
MCH: 31.7 pg (ref 26.0–34.0)
MCHC: 32.1 g/dL (ref 30.0–36.0)
MCV: 98.8 fL (ref 78.0–100.0)
PLATELETS: 145 10*3/uL — AB (ref 150–400)
RBC: 3.44 MIL/uL — ABNORMAL LOW (ref 4.22–5.81)
RDW: 14.9 % (ref 11.5–15.5)
WBC: 4.6 10*3/uL (ref 4.0–10.5)

## 2015-12-21 MED ORDER — CARVEDILOL 6.25 MG PO TABS: 6.2500 mg | ORAL_TABLET | Freq: Two times a day (BID) | ORAL | Status: DC

## 2015-12-21 MED ORDER — FLUOXETINE HCL 20 MG PO CAPS: 20.0000 mg | ORAL_CAPSULE | Freq: Every day | ORAL | Status: DC

## 2015-12-21 MED ORDER — TRAZODONE HCL 50 MG PO TABS: 50.0000 mg | ORAL_TABLET | Freq: Every day | ORAL | Status: DC

## 2015-12-21 NOTE — NC FL2 (Signed)
[place] LEVEL OF CARE SCREENING TOOL     IDENTIFICATION  Patient Name: Stephen [name] Birthdate: Dec 23, 1960 Sex: male Admission Date (Current Location): 12/16/2015  [facility] and [place] Number:  Herbalist and Address:  [facility] [place] [place] [address], [place], [place] [address]      Provider Number: [id number]  Attending Physician Name and Address:  Domenic Polite, MD  Relative Name and Phone Number:       Current Level of Care: Hospital Recommended Level of Care: [place] Prior Approval Number:    Date Approved/Denied:   PASRR Number: [id number]  Discharge Plan: SNF    Current Diagnoses: Patient Active Problem List   Diagnosis Date Noted  . Tachycardia 12/16/2015  . SVT (supraventricular tachycardia) ([place]) 12/16/2015  . Alcohol abuse 12/16/2015  . Hypokalemia 12/16/2015  . Leukocytosis 12/16/2015  . Thrombocytopenia ([place]) 12/16/2015  . Acute encephalopathy 10/28/2015  . Alcohol withdrawal ([place]) 10/25/2015  . Abdominal pain, right upper quadrant 10/25/2015  . Constipation 10/25/2015  . Ischemic cardiomyopathy 09/02/2014  . Cardiomyopathy, ischemic 07/15/2014  . Atypical chest pain 07/15/2014  . Hemarthrosis 12/10/2013  . Left knee pain 12/10/2013  . Ventricular tachycardia ([place]) 08/08/2011  . Cardiac defibrillator-Medtronic-single-chamber 08/08/2011  . [id number] lead 08/08/2011  . HYPERLIPIDEMIA-MIXED 05/26/2009  . OVERWEIGHT/OBESITY 05/26/2009  . Essential hypertension 05/26/2009  . CAD, NATIVE VESSEL 05/26/2009  . Cardiomyopathy ([place]) 05/26/2009  . SYSTOLIC HEART FAILURE, CHRONIC 05/26/2009    Orientation RESPIRATION BLADDER Height & Weight     Self, Time, Situation, Place  Normal Continent Weight: 193 lb 3.2 oz (87.635 kg) Height:  6' (182.9 cm)  BEHAVIORAL SYMPTOMS/MOOD NEUROLOGICAL BOWEL NUTRITION STATUS      Continent Diet (Heart Healthy)  AMBULATORY STATUS COMMUNICATION OF NEEDS Skin    Extensive Assist Verbally Normal                       Personal Care Assistance Level of Assistance  Bathing, Dressing Bathing Assistance: Limited assistance   Dressing Assistance: Limited assistance     Functional Limitations Info        Speech Info:  (Speech pattern is slightly delayed)    SPECIAL CARE FACTORS FREQUENCY  PT (By licensed PT), OT (By licensed OT)     PT Frequency: 5 OT Frequency: 5            Contractures Contractures Info: Not present    Additional Factors Info  Code Status, Allergies Code Status Info: Full Allergies Info: NKA           Current Medications (12/21/2015):  This is the current hospital active medication list Current Facility-Administered Medications  Medication Dose Route Frequency Provider Last Rate Last Dose  . albuterol (PROVENTIL) (2.5 MG/3ML) 0.083% nebulizer solution 2.5 mg  2.5 mg Nebulization Q2H PRN Albertine Patricia, MD      . aspirin EC tablet 81 mg  81 mg Oral QODAY Albertine Patricia, MD   81 mg at 12/21/15 1059  . carvedilol (COREG) tablet 6.25 mg  6.25 mg Oral BID WC Domenic Polite, MD   6.25 mg at 12/21/15 1058  . cholecalciferol (VITAMIN D) tablet 1,000 Units  1,000 Units Oral Daily Albertine Patricia, MD   1,000 Units at 12/21/15 1058  . enalapril (VASOTEC) tablet 5 mg  5 mg Oral Daily Domenic Polite, MD   5 mg at 12/21/15 1105  . feeding supplement (ENSURE ENLIVE) (ENSURE ENLIVE) liquid 237 mL  237 mL Oral BID BM Domenic Polite, MD   237 mL at 12/21/15 1000  . FLUoxetine (PROZAC) capsule 20 mg  20 mg Oral Daily Ambrose Finland, MD   20 mg at 12/21/15 1058  . folic acid (FOLVITE) tablet 1 mg  1 mg Oral Daily Albertine Patricia, MD   1 mg at [id number]   Or  . folic acid injection 1 mg  1 mg Intravenous Daily Silver Huguenin Elgergawy, MD      . LORazepam (ATIVAN) injection 0-4 mg  0-4 mg Intravenous 4 times per day Fransico Meadow, PA-C   0 mg at 12/21/15 0030  . LORazepam (ATIVAN) tablet 0-4 mg  0-4 mg  Oral Q6H PRN Fransico Meadow, PA-C      . metoprolol (LOPRESSOR) injection 2.5 mg  2.5 mg Intravenous Q6H PRN Albertine Patricia, MD   2.5 mg at 12/16/15 1841  . multivitamin with minerals tablet 1 tablet  1 tablet Oral Daily Albertine Patricia, MD   1 tablet at 12/21/15 1058  . naphazoline-glycerin (CLEAR EYES) ophth solution 2 drop  2 drop Both Eyes QID PRN Albertine Patricia, MD      . nitroGLYCERIN (NITROSTAT) SL tablet 0.4 mg  0.4 mg Sublingual Q5 min PRN Albertine Patricia, MD      . pantoprazole (PROTONIX) EC tablet 40 mg  40 mg Oral Daily Albertine Patricia, MD   40 mg at 12/21/15 1057  . potassium chloride 20 MEQ/15ML (10%) solution 40 mEq  40 mEq Oral Daily Domenic Polite, MD   40 mEq at 12/21/15 1057  . pravastatin (PRAVACHOL) tablet 20 mg  20 mg Oral Daily Albertine Patricia, MD   20 mg at 12/21/15 1058  . rivaroxaban (XARELTO) tablet 20 mg  20 mg Oral Q supper Albertine Patricia, MD   20 mg at 12/20/15 1654  . sodium chloride flush (NS) 0.9 % injection 3 mL  3 mL Intravenous Q12H Albertine Patricia, MD   3 mL at 12/21/15 1000  . thiamine (B-1) injection 100 mg  100 mg Intravenous Daily Albertine Patricia, MD   100 mg at 12/19/15 1402  . thiamine (VITAMIN B-1) tablet 100 mg  100 mg Oral Daily Hollace Kinnier [place], PA-C   100 mg at 12/21/15 1058  . tiotropium (SPIRIVA) inhalation capsule 18 mcg  18 mcg Inhalation Daily PRN Albertine Patricia, MD      . traZODone (DESYREL) tablet 50 mg  50 mg Oral QHS Ambrose Finland, MD   50 mg at 12/20/15 2300     Discharge Medications: Please see discharge summary for a list of discharge medications.  Relevant Imaging Results:  Relevant Lab Results:   Additional Information SSN:  [id number], Lorie Phenix, [place]

## 2015-12-21 NOTE — Discharge Summary (Signed)
Physician Discharge Summary  Stephen [name] [id number] DOB: [birth year] DOA: 12/16/2015  PCP: [name], MD  Admit date: 12/16/2015 Discharge date: 12/21/2015  Time spent: 45 minutes  Recommendations for Outpatient Follow-up:  1. Being discharged to SNF for Rehab 2. Needs Alcohol Rehab/assistance /community support 3. Dr.Nyland PCP in 1 week, please monitor CBC for platelet count while on Xarelto  Discharge Diagnoses:  Principal Problem:   Alcohol abuse   EToh withdrawals   Essential hypertension   Cardiomyopathy ([place])   SYSTOLIC HEART FAILURE, CHRONIC   Ventricular tachycardia (HCC)   Cardiac defibrillator-Medtronic-single-chamber   Tachycardia   Hypokalemia   Leukocytosis   Thrombocytopenia (HCC)   Discharge Condition: stable  Diet recommendation: heart healthy  Filed Weights   12/16/15 1728 12/20/15 1425 12/21/15 0422  Weight: 88.6 kg (195 lb 5.2 oz) 88.406 kg (194 lb 14.4 oz) 87.635 kg (193 lb 3.2 oz)    History of present illness:  HPI Stephen [name] is a 55 y.o. male, with alcohol abuse, hypertension, hyperlipidemia, coronary artery disease status post AICD implementation, obstructive sleep apnea, PE on chronic Xarelto therapy, history of noncompliance with medication, patient presented with complaints of nausea and vomiting for last 3 days, as well as altered mental status drinks half a gallon of whiskey daily, Sent by PCP for tachycardia, and to see his PCP for evaluation for his nausea and vomiting, workup significant for tachycardia heart rate in the 170s, thought to be SVT in ED, Seen by EP and cleared to be sinus tachycardia from ETOH withdrawal.  Hospital Course:  Alcohol abuse/ETOH withdrawal - heavy ETOH abuse for 30years, recurrent admissions for same - treated with Ativan per CIWA protocol - thiamine, Psych and CSW consulted  - wife reports 5 admissions since December for ETOH abuse/withdrawal at different hospitals - pt reports interest in  ETOH Rehab, at this time needs SNF for Rehab, will need long term Alcohol abstinence help  Sinus Tachycardia -appreciate EP input, improved, initially there was a concern for SVT, this was ruled out by EP, who felt it was purely sinus tachycardia -due to ETOH intoxication/dehydration -improving, cut down IVF -changed Bisoprolol to Coreg, has ischemic cardiomyopathy -titrate BB up as BP toelrates  Nausea and vomiting - Benign abdominal exam, most likely related to gastritis - supportive care, resolved  Elevated troponin - Minimally elevated, felt to be due to demand ischemia secondary to his tachycardia, ETOH withdrawal - trend is flat, no evidence of ACS, continue BB  History of chronic systolic CHF, and ischemic cardiopathy - Status post AICD, appears to be volume depleted, continue with gentle hydration, - resumed beta blockers, ACE  Hypokalemia/hypomagnesemia - give 2gm mag again and KCL PO.  Obstructive sleep apnea - Noncompliant with C Pap. -counseled  Atrial fibrillation - Continue with beta blockers, resumed on Xarelto, CHA2DS2-VASc Score of 3 - plts improving, will need this monitored closely  History of PE - Continue with Xarelto, monitor Plts closely  Thrombocytopenia - Continue to alcohol abuse, monitor closely  Consultations:  Psychiatry  Discharge Exam: Filed Vitals:   12/21/15 1400 12/21/15 1423  BP:  107/65  Pulse:  101  Temp:  98 F (36.7 C)  Resp: 18     General: AAOx3 Cardiovascular: S1S2/RRR Respiratory: CTAB  Discharge Instructions   Discharge Instructions    Diet - low sodium heart healthy    Complete by:  As directed      Increase activity slowly    Complete by:  As directed  Current Discharge Medication List    START taking these medications   Details  carvedilol (COREG) 6.25 MG tablet Take 1 tablet (6.25 mg total) by mouth 2 (two) times daily with a meal. Qty: 60 tablet, Refills: 0    FLUoxetine (PROZAC) 20 MG  capsule Take 1 capsule (20 mg total) by mouth daily. Refills: 3    traZODone (DESYREL) 50 MG tablet Take 1 tablet (50 mg total) by mouth at bedtime.      CONTINUE these medications which have NOT CHANGED   Details  aspirin 81 MG EC tablet Take 81 mg by mouth every other day.     cholecalciferol (VITAMIN D) 1000 UNITS tablet Take 1,000 Units by mouth daily.     enalapril (VASOTEC) 10 MG tablet Take 10 mg by mouth daily.     folic acid (FOLVITE) 1 MG tablet Take 1 mg by mouth daily.     furosemide (LASIX) 40 MG tablet Take 40 mg by mouth daily.    KLOR-CON M20 20 MEQ tablet Take 20 mEq by mouth daily.    mometasone (NASONEX) 50 MCG/ACT nasal spray Place 2 sprays into the nose daily as needed. For nasal congestion    nitroGLYCERIN (NITROSTAT) 0.4 MG SL tablet Place 0.4 mg under the tongue every 5 (five) minutes as needed for chest pain.    omeprazole (PRILOSEC) 40 MG capsule Take 40 mg by mouth daily.    pravastatin (PRAVACHOL) 20 MG tablet Take 20 mg by mouth daily.     rivaroxaban (XARELTO) 20 MG TABS tablet Take 20 mg by mouth daily with supper.    tetrahydrozoline-zinc (VISINE-AC) 0.05-0.25 % ophthalmic solution Place 2 drops into both eyes 3 (three) times daily as needed (redness relief).    tiotropium (SPIRIVA) 18 MCG inhalation capsule Place 18 mcg into inhaler and inhale daily as needed (shortness of breath).       STOP taking these medications     bisoprolol (ZEBETA) 5 MG tablet      spironolactone (ALDACTONE) 25 MG tablet        No Known Allergies Follow-up Information    Follow up with [name], MD. Schedule an appointment as soon as possible for a visit in 1 week.   Specialty:  Family Medicine   Contact information:   [place] [id number] [phone]        The results of significant diagnostics from this hospitalization (including imaging, microbiology, ancillary and laboratory) are listed below for reference.     Significant Diagnostic Studies: Dg Chest 2 View  12/21/2015  CLINICAL DATA:  Hypoxia/shortness-of-breath with diminished breath sounds at the lung bases. EXAM: CHEST  2 VIEW COMPARISON:  12/16/2015 FINDINGS: Left-sided pacemaker unchanged. Lungs are adequately inflated without consolidation or effusion. Cardiomediastinal silhouette and remainder of the exam is unchanged. IMPRESSION: No active cardiopulmonary disease. Electronically Signed   By: Marin Olp M.D.   On: 12/21/2015 13:32   Dg Chest Port 1 View  12/16/2015  CLINICAL DATA:  Tachycardia EXAM: PORTABLE CHEST 1 VIEW COMPARISON:  10/25/2015 FINDINGS: Cardiac shadow is stable. A defibrillator is again seen and stable. The lungs are clear bilaterally. No acute bony abnormality is seen. IMPRESSION: No active disease. Electronically Signed   By: Inez Catalina M.D.   On: 12/16/2015 13:49    Microbiology: Recent Results (from the past 240 hour(s))  MRSA PCR Screening     Status: None   Collection Time: 12/16/15  6:57 PM  Result Value Ref Range Status  MRSA by PCR NEGATIVE NEGATIVE Final    Comment:        The GeneXpert MRSA Assay (FDA approved for NASAL specimens only), is one component of a comprehensive MRSA colonization surveillance program. It is not intended to diagnose MRSA infection nor to guide or monitor treatment for MRSA infections.      Labs: Basic Metabolic Panel:  Recent Labs Lab 12/16/15 1337 12/16/15 1414 12/16/15 1548 12/17/15 0605 12/18/15 0606 12/20/15 0449  NA 130* 135  --  135 137 137  K 4.2 2.8*  --  3.4* 3.7 3.0*  CL 90* 95*  --  101 103 106  CO2  --  21*  --  25 23 24   GLUCOSE 147* 129*  --  77 66 133*  BUN 19 13  --  14 11 7   CREATININE 1.20 1.12  --  0.87 0.80 0.73  CALCIUM  --  8.8*  --  8.0* 8.5* 8.8*  MG  --   --  0.7* 1.6*  --   --    Liver Function Tests:  Recent Labs Lab 12/16/15 1414  AST 73*  ALT 62  ALKPHOS 96  BILITOT 2.4*  PROT 5.8*  ALBUMIN 2.1*    Recent  Labs Lab 12/16/15 1414  LIPASE 17   No results for input(s): AMMONIA in the last 168 hours. CBC:  Recent Labs Lab 12/16/15 1414 12/17/15 0605 12/18/15 0606 12/20/15 0449 12/21/15 1001  WBC 16.2* 12.8* 7.4 5.7 4.6  NEUTROABS 13.4*  --   --   --   --   HGB 14.5 11.8* 12.5* 11.2* 10.9*  HCT 41.9 36.1* 39.2 34.6* 34.0*  MCV 97.4 97.6 100.5* 98.6 98.8  PLT 103* 80* 77* 113* 145*   Cardiac Enzymes:  Recent Labs Lab 12/16/15 0950 12/16/15 1414 12/17/15 0605  TROPONINI 0.08* 0.05* 0.06*   BNP: BNP (last 3 results)  Recent Labs  10/25/15 1430  BNP 47.0    ProBNP (last 3 results) No results for input(s): PROBNP in the last 8760 hours.  CBG: No results for input(s): GLUCAP in the last 168 hours.     SignedDomenic Polite MD.  [facility] 12/21/2015, 4:16 PM

## 2015-12-21 NOTE — Clinical Social Work Placement (Signed)
   CLINICAL SOCIAL WORK PLACEMENT  NOTE  Date:  12/21/2015  Patient Details  Name: Stephen [name] MRN: [id number] Date of Birth: 09-15-1961  Clinical Social Work is seeking post-discharge placement for this patient at the [place] level of care (*CSW will initial, date and re-position this form in  chart as items are completed):  Yes   Patient/family provided with [place] Work Department's list of facilities offering this level of care within the geographic area requested by the patient (or if unable, by the patient's family).  Yes   Patient/family informed of their freedom to choose among providers that offer the needed level of care, that participate in Medicare, Medicaid or managed care program needed by the patient, have an available bed and are willing to accept the patient.  Yes   Patient/family informed of [place]'s ownership interest in [facility] and [facility], as well as of the fact that they are under no obligation to receive care at these facilities.  PASRR submitted to EDS on 12/14/15     PASRR number received on 12/21/15     Existing PASRR number confirmed on       FL2 transmitted to all facilities in geographic area requested by pt/family on 12/21/15     FL2 transmitted to all facilities within larger geographic area on       Patient informed that his/her managed care company has contracts with or will negotiate with certain facilities, including the following:   [facility])     Yes   Patient/family informed of bed offers received.  Patient chooses bed at [facility]     Physician recommends and patient chooses bed at      Patient to be transferred to [facility] on 12/21/15.  Patient to be transferred to facility by Ambulance [name])     Patient family notified on 12/21/15 of transfer.  Name of family member notified:  Wife (separated) per patient's request-   [name]      PHYSICIAN Please prepare priority discharge summary, including medications, Please sign FL2, Please prepare prescriptions     Additional Comment:    _______________________________________________ Williemae Area, LCSW 336 [phone]

## 2015-12-21 NOTE — Clinical Social Work Note (Signed)
Clinical Social Work Assessment  Patient Details  Name: Stephen [name] MRN: [id number] Date of Birth: 18-May-1961  Date of referral:  12/21/15               Reason for consult:  Facility Placement                Permission sought to share information with:  Chartered certified accountant granted to share information::  Yes, Verbal Permission Granted  Name::     [facility] SNF's- Lives In [place].   Wife- (separated), Inlaws  Agency::     Relationship::     Contact Information:     Housing/Transportation Living arrangements for the past 2 months:  Single Family Home Source of Information:  Patient Patient Interpreter Needed:  None Criminal Activity/Legal Involvement Pertinent to Current Situation/Hospitalization:  No - Comment as needed Significant Relationships:  Adult Children, Spouse (Seperated from wife/family greater than 2 years) Lives with:  Self Do you feel safe going back to the place where you live?  No ("I'll start drinking again.") Need for family participation in patient care:   No but patient would like his family involved if they would help him.  Care giving concerns:  "I need help; I can't care for myself right now.  I want to get stronger and stop drinking. I need rehab".  Social Worker assessment / plan:  CSW met with patient today to discuss d/c concerns. Per MD- patient is medically stable for d/c today. Patient is asking for "rehab"- wants alcohol rehab.  Physical Therapy recommends SNF rehab at this time. Discussed with patient- he agrees that he is physically weak and does not feel he could manage independently at this time. He is interested in alcohol rehab in the future- inpatient or outpatient. He states that he has lost his marriage and family due to his drinking (over 20 years).  He has been separated from his wife for 2 1/2 years and states she now wants a divorce. She continues to maintain some contact with patient helping him to pay his  bills etc. He denies any sobriety for more than a few months in well over 20 years.   Physical Therapy recommends short term rehab for physical strengthening and patient now agrees to enter SNF short term; he later hopes to seek alcohol treatment and long term support for sobriety.  Fl2 initiated and sent to [facility]. He lives in [place] and hopes for placement at [facility].  CSW spoke to admissions at [facility] and she agreed to review his referral and will call bac..  Employment status:  Disabled (Comment on whether or not currently receiving Disability) Insurance information:  Managed Medicare PT Recommendations:  [place] / Referral to community resources:  [place]  Patient/Family's Response to care: "I need and want help- I feel bad all the time"  Patient states he has felt better since he entered the hospital.  Patient/Family's Understanding of and Emotional Response to Diagnosis, Current Treatment, and Prognosis:  "I know if I keep drinking I"m going to die."  Patient indicates that he knows that his health has been steadily "going down" and doesn't know how he can manage at home alone at this time.  He states that he has had blackouts and many other health issues due to his chronic drinking.  Emotional Assessment Appearance:  Appears older than stated age, Disheveled Attitude/Demeanor/Rapport:  Apprehensive (Cooperative, anxious) Affect (typically observed):  Sad, Apprehensive (Easily  frustrated) Orientation:  Oriented to Self, Oriented to Place, Oriented to  Time, Oriented to Situation Alcohol / Substance use:  Alcohol Use, Tobacco Use Psych involvement (Current and /or in the community):  No (Comment)  Discharge Needs  Concerns to be addressed:  Care Coordination, Substance Abuse Concerns Readmission within the last 30 days:  No Current discharge risk:  Lack of support system, Substance Abuse, Dependent with  Mobility, Lives alone Barriers to Discharge:  No Barriers Identified (Ready for d/c today)   Williemae Area, LCSW 12/21/2015,2:00 PM

## 2016-01-05 ENCOUNTER — Other Ambulatory Visit (INDEPENDENT_AMBULATORY_CARE_PROVIDER_SITE_OTHER): Payer: Self-pay | Admitting: *Deleted

## 2016-01-05 ENCOUNTER — Encounter (INDEPENDENT_AMBULATORY_CARE_PROVIDER_SITE_OTHER): Payer: Self-pay

## 2016-01-05 ENCOUNTER — Ambulatory Visit (INDEPENDENT_AMBULATORY_CARE_PROVIDER_SITE_OTHER): Payer: Medicare Other | Admitting: Internal Medicine

## 2016-01-05 ENCOUNTER — Encounter (INDEPENDENT_AMBULATORY_CARE_PROVIDER_SITE_OTHER): Payer: Self-pay | Admitting: Internal Medicine

## 2016-01-05 ENCOUNTER — Encounter (INDEPENDENT_AMBULATORY_CARE_PROVIDER_SITE_OTHER): Payer: Self-pay | Admitting: *Deleted

## 2016-01-05 ENCOUNTER — Other Ambulatory Visit (INDEPENDENT_AMBULATORY_CARE_PROVIDER_SITE_OTHER): Payer: Self-pay | Admitting: Internal Medicine

## 2016-01-05 VITALS — BP 96/60 | HR 56 | Temp 98.0°F | Ht 72.0 in | Wt 192.8 lb

## 2016-01-05 DIAGNOSIS — D369 Benign neoplasm, unspecified site: Secondary | ICD-10-CM | POA: Insufficient documentation

## 2016-01-05 DIAGNOSIS — R1310 Dysphagia, unspecified: Secondary | ICD-10-CM | POA: Diagnosis not present

## 2016-01-05 DIAGNOSIS — Z1211 Encounter for screening for malignant neoplasm of colon: Secondary | ICD-10-CM

## 2016-01-05 DIAGNOSIS — R1319 Other dysphagia: Secondary | ICD-10-CM

## 2016-01-05 MED ORDER — PEG 3350-KCL-NA BICARB-NACL 420 G PO SOLR: 4000.0000 mL | Freq: Once | ORAL | Status: DC

## 2016-01-05 NOTE — Patient Instructions (Signed)
EGD/ED Colonoscopy.  The risks and benefits such as perforation, bleeding, and infection were reviewed with the patient and is agreeable. 

## 2016-01-05 NOTE — Telephone Encounter (Signed)
Patient needs trilyte 

## 2016-01-05 NOTE — Progress Notes (Signed)
Subjective:    Patient ID: Stephen [name], male    DOB: 1961-04-06, 55 y.o.   MRN: [id number] Doing Rehab at [facility] for orthostatic hypotension. No etoh in 1 month.. Was drinking about a pint a day.  HPI Referred by Dr. Eula Fried for dysphagia. He tells me has trouble swallowing water. He also has trouble swallowing foods. He says this (dysphagia) flares every once in a while. Symptoms for about a month. Chunky meats feel like they are slow to go down.  He does have some burning in his esophagus.  He usually has a BM 1-2 times a day. Last week he had diarrhea. The diarrhea lasted for about  4-5 days.  He says he was put on a nector diet due to the dysphagia.  After stopping the nector diet, his stools returned to normal.   Patient denies any rectal bleeding. Stools are [place] in color. Occasionally has abdominal pain.  Appetite is good.  Hx of PE, defibrillator and maintained on Xarelto and ASA. Hx significant of CAD. Hx of V.Tach.    05/19/2008: Colonoscopy under fluoroscopy: rectal bleeding. Exam to cecum.  A few tiny diverticula at the sigmoid colon. Small flat polyp at the cecum which was ablated via cold biopsy. Biopsy: Tubular adenoma.    05/13/2007: Colonoscopy. Dr. Oneida Alar  INDICATIONS FOR PROCEDURE: Mr. Carrisalez is a 55 year old male who presents with rectal bleeding.  FINDINGS: 1. Tortuous colon which would not allow successful intubation of the  cecum after multiple attempts at repositioning, pressure and  straightening and advancing the scope. Otherwise, no polyps,  masses, inflammatory changes or AVM's seen. 2. Rare sigmoid diverticulosis. 3. Normal retroflexed view of the rectum. External hemorrhoids seen  on rectal exam.   09/22/2016: H and H 14.9 and 42.8, MCV 97, Platelet ct 291.  Review of Systems Past Medical History  Diagnosis Date  . Obesity   . Hypertension   . Hyperlipidemia   . Systolic heart failure   .  Coronary artery disease     native vessel  . AICD (automatic cardioverter/defibrillator) present 2006    [place] 7232  . Ischemic cardiomyopathy     ischemic  . OSA (obstructive sleep apnea)   . [place] Paroxysmal ventricular tachycardia ([place])     Rx via ATP 2008  . Pulmonary embolism ([place])   . MI, old     Past Surgical History  Procedure Laterality Date  . Cardiac defibrillator placement  2006    [employer] Maximo 7232  . Knee surgery      left   . Implantable cardioverter defibrillator generator change N/A 09/02/2014    Procedure: IMPLANTABLE CARDIOVERTER DEFIBRILLATOR GENERATOR CHANGE;  Surgeon: Deboraha Sprang, MD;  Location: [facility] CATH LAB;  Service: Cardiovascular;  Laterality: N/A;  . Lead revision N/A 09/02/2014    Procedure: LEAD REVISION;  Surgeon: Deboraha Sprang, MD;  Location: [facility] CATH LAB;  Service: Cardiovascular;  Laterality: N/A;    No Known Allergies  Current Outpatient Prescriptions on File Prior to Visit  Medication Sig Dispense Refill  . aspirin 81 MG EC tablet Take 81 mg by mouth every other day.     . carvedilol (COREG) 6.25 MG tablet Take 1 tablet (6.25 mg total) by mouth 2 (two) times daily with a meal. 60 tablet 0  . cholecalciferol (VITAMIN D) 1000 UNITS tablet Take 1,000 Units by mouth daily.     Marland Kitchen FLUoxetine (PROZAC) 20 MG capsule Take 1 capsule (  20 mg total) by mouth daily.  3  . folic acid (FOLVITE) 1 MG tablet Take 1 mg by mouth daily.     . furosemide (LASIX) 40 MG tablet Take 40 mg by mouth daily.    Marland Kitchen KLOR-CON M20 20 MEQ tablet Take 20 mEq by mouth daily.    . mometasone (NASONEX) 50 MCG/ACT nasal spray Place 2 sprays into the nose daily as needed. For nasal congestion    . nitroGLYCERIN (NITROSTAT) 0.4 MG SL tablet Place 0.4 mg under the tongue every 5 (five) minutes as needed for chest pain.    Marland Kitchen omeprazole (PRILOSEC) 40 MG capsule Take 40 mg by mouth daily.    . rivaroxaban (XARELTO) 20 MG TABS tablet Take 20 mg by mouth daily with  supper.    . tetrahydrozoline-zinc (VISINE-AC) 0.05-0.25 % ophthalmic solution Place 2 drops into both eyes 3 (three) times daily as needed (redness relief).    Marland Kitchen tiotropium (SPIRIVA) 18 MCG inhalation capsule Place 18 mcg into inhaler and inhale daily as needed (shortness of breath).     . traZODone (DESYREL) 50 MG tablet Take 1 tablet (50 mg total) by mouth at bedtime.     No current facility-administered medications on file prior to visit.        Objective:   Physical ExamBlood pressure 96/60, pulse 56, temperature 98 F (36.7 C), height 6' (1.829 m), weight 192 lb 12.8 oz (87.454 kg). Alert and oriented. Skin warm and dry. Oral mucosa is moist.   . Sclera anicteric, conjunctivae is pink. Thyroid not enlarged. No cervical lymphadenopathy. Lungs clear. Heart regular rate and rhythm.  Abdomen is soft. Bowel sounds are positive. No hepatomegaly. No abdominal masses felt. No tenderness.  No edema to lower extremities.          Assessment & Plan:  Dysphagia. Stricture needs to be ruled out. PUD needs to be ruled out.  Surveillance colonoscopy/ Hx of tubular adenoma: Last colonoscopy was at [facility] by Dr. Laural Golden in 2009.

## 2016-01-14 NOTE — Patient Instructions (Signed)
41    Your procedure is scheduled on: 01/21/2016   Report to [facility] at  7:00   AM.  Call this number if you have problems the morning of surgery: [phone]   Remember:   Do not drink or eat food:After Midnight.    Clear liquids include soda, tea, black coffee, apple or grape juice, broth.  Take these medicines the morning of surgery with A SIP OF WATER: Coreg, Prozac, Lisinopril, Omeprazole and Spiriva   Do not wear jewelry, make-up or nail polish.  Do not wear lotions, powders, or perfumes. You may wear deodorant.  Do not shave 48 hours prior to surgery. Men may shave face and neck.  Do not bring valuables to the hospital.  Contacts, dentures or bridgework may not be worn into surgery.  Leave suitcase in the car. After surgery it may be brought to your room.  For patients admitted to the hospital, checkout time is 11:00 AM the day of discharge.   Patients discharged the day of surgery will not be allowed to drive home.  Name and phone number of your driver:    Please read over the following fact sheets that you were given: Pain Booklet, Lab Information and Anesthesia Post-op Instructions   Endoscopy Care After Please read the instructions outlined below and refer to this sheet in the next few weeks. These discharge instructions provide you with general information on caring for yourself after you leave the hospital. Your doctor may also give you specific instructions. While your treatment has been planned according to the most current medical practices available, unavoidable complications occasionally occur. If you have any problems or questions after discharge, please call your doctor. HOME CARE INSTRUCTIONS Activity  You may resume your regular activity but move at a slower pace for the next 24 hours.   Take frequent rest periods for the next 24 hours.   Walking will help expel (get rid of) the air and reduce the bloated feeling in your abdomen.   No driving for 24 hours  (because of the anesthesia (medicine) used during the test).   You may shower.   Do not sign any important legal documents or operate any machinery for 24 hours (because of the anesthesia used during the test).  Nutrition  Drink plenty of fluids.   You may resume your normal diet.   Begin with a light meal and progress to your normal diet.   Avoid alcoholic beverages for 24 hours or as instructed by your caregiver.  Medications You may resume your normal medications unless your caregiver tells you otherwise. What you can expect today  You may experience abdominal discomfort such as a feeling of fullness or "gas" pains.   You may experience a sore throat for 2 to 3 days. This is normal. Gargling with salt water may help this.  Follow-up Your doctor will discuss the results of your test with you. SEEK IMMEDIATE MEDICAL CARE IF:  You have excessive nausea (feeling sick to your stomach) and/or vomiting.   You have severe abdominal pain and distention (swelling).   You have trouble swallowing.   You have a temperature over 100 F (37.8 C).   You have rectal bleeding or vomiting of blood.  Document Released: 06/06/2004 Document Revised: 10/12/2011 Document Reviewed: 12/18/2007 Colonoscopy, Care After Refer to this sheet in the next few weeks. These instructions provide you with information on caring for yourself after your procedure. Your health care provider may also give you more specific instructions.  Your treatment has been planned according to current medical practices, but problems sometimes occur. Call your health care provider if you have any problems or questions after your procedure. WHAT TO EXPECT AFTER THE PROCEDURE  After your procedure, it is typical to have the following:  A small amount of blood in your stool.  Moderate amounts of gas and mild abdominal cramping or bloating. HOME CARE INSTRUCTIONS  Do not drive, operate machinery, or sign important documents  for 24 hours.  You may shower and resume your regular physical activities, but move at a slower pace for the first 24 hours.  Take frequent rest periods for the first 24 hours.  Walk around or put a warm pack on your abdomen to help reduce abdominal cramping and bloating.  Drink enough fluids to keep your urine clear or pale yellow.  You may resume your normal diet as instructed by your health care provider. Avoid heavy or fried foods that are hard to digest.  Avoid drinking alcohol for 24 hours or as instructed by your health care provider.  Only take over-the-counter or prescription medicines as directed by your health care provider.  If a tissue sample (biopsy) was taken during your procedure:  Do not take aspirin or blood thinners for 7 days, or as instructed by your health care provider.  Do not drink alcohol for 7 days, or as instructed by your health care provider.  Eat soft foods for the first 24 hours. SEEK MEDICAL CARE IF: You have persistent spotting of blood in your stool 2-3 days after the procedure. SEEK IMMEDIATE MEDICAL CARE IF:  You have more than a small spotting of blood in your stool.  You pass large blood clots in your stool.  Your abdomen is swollen (distended).  You have nausea or vomiting.  You have a fever.  You have increasing abdominal pain that is not relieved with medicine.   This information is not intended to replace advice given to you by your health care provider. Make sure you discuss any questions you have with your health care provider.   Document Released: 06/06/2004 Document Revised: 08/13/2013 Document Reviewed: 06/30/2013 Elsevier Interactive Patient Education [employer].

## 2016-01-17 ENCOUNTER — Encounter (HOSPITAL_COMMUNITY): Payer: Self-pay

## 2016-01-17 ENCOUNTER — Encounter (HOSPITAL_COMMUNITY)
Admission: RE | Admit: 2016-01-17 | Discharge: 2016-01-17 | Disposition: A | Discharge status: Home / Self Care | Payer: Medicare Other | Source: Ambulatory Visit | Attending: Internal Medicine | Admitting: Internal Medicine

## 2016-01-17 ENCOUNTER — Other Ambulatory Visit: Payer: Self-pay

## 2016-01-17 DIAGNOSIS — Z01812 Encounter for preprocedural laboratory examination: Secondary | ICD-10-CM | POA: Insufficient documentation

## 2016-01-17 HISTORY — DX: Reserved for inherently not codable concepts without codable children: IMO0001

## 2016-01-17 HISTORY — DX: Major depressive disorder, single episode, unspecified: F32.9

## 2016-01-17 HISTORY — DX: Gastro-esophageal reflux disease without esophagitis: K21.9

## 2016-01-17 HISTORY — DX: Depression, unspecified: F32.A

## 2016-01-17 HISTORY — DX: Unspecified osteoarthritis, unspecified site: M19.90

## 2016-01-17 NOTE — Pre-Procedure Instructions (Signed)
Patient given information to sign up for my chart at home. 

## 2016-01-17 NOTE — Pre-Procedure Instructions (Signed)
Patient in for PAT. He has defibrillator. Implanted Cardiac device sheet implemented and faxed to Dr Olin Pia office.Called and left them message also that patient was for surgery on Friday. Called cardiology to get [employer] number and they gave me our area Rep, Rayetta Pigg number [phone]. I called rep and tried to give her patient information about procedure on Friday,she quickly tells me to' "call me later in the week when his procedure is closer", and hangs up the phone. Abbie Sons, RN charge nurse notified. Spoke with Dr Patsey Berthold, anesthesiologist notified and he states we dont need to have defibrillator deactivated for this procedure.

## 2016-01-18 ENCOUNTER — Emergency Department (HOSPITAL_COMMUNITY): Payer: Medicare Other

## 2016-01-18 ENCOUNTER — Encounter (HOSPITAL_COMMUNITY): Payer: Self-pay | Admitting: *Deleted

## 2016-01-18 ENCOUNTER — Emergency Department (HOSPITAL_COMMUNITY)
Admission: EM | Admit: 2016-01-18 | Discharge: 2016-01-18 | Disposition: A | Discharge status: Home / Self Care | Payer: Medicare Other | Attending: Emergency Medicine | Admitting: Emergency Medicine

## 2016-01-18 ENCOUNTER — Other Ambulatory Visit: Payer: Self-pay

## 2016-01-18 DIAGNOSIS — I110 Hypertensive heart disease with heart failure: Secondary | ICD-10-CM | POA: Diagnosis not present

## 2016-01-18 DIAGNOSIS — M1990 Unspecified osteoarthritis, unspecified site: Secondary | ICD-10-CM | POA: Diagnosis not present

## 2016-01-18 DIAGNOSIS — E785 Hyperlipidemia, unspecified: Secondary | ICD-10-CM | POA: Diagnosis not present

## 2016-01-18 DIAGNOSIS — I951 Orthostatic hypotension: Secondary | ICD-10-CM | POA: Diagnosis not present

## 2016-01-18 DIAGNOSIS — F17210 Nicotine dependence, cigarettes, uncomplicated: Secondary | ICD-10-CM | POA: Insufficient documentation

## 2016-01-18 DIAGNOSIS — F329 Major depressive disorder, single episode, unspecified: Secondary | ICD-10-CM | POA: Diagnosis not present

## 2016-01-18 DIAGNOSIS — R42 Dizziness and giddiness: Secondary | ICD-10-CM | POA: Diagnosis present

## 2016-01-18 DIAGNOSIS — I252 Old myocardial infarction: Secondary | ICD-10-CM | POA: Diagnosis not present

## 2016-01-18 DIAGNOSIS — I5020 Unspecified systolic (congestive) heart failure: Secondary | ICD-10-CM | POA: Insufficient documentation

## 2016-01-18 DIAGNOSIS — Z79899 Other long term (current) drug therapy: Secondary | ICD-10-CM | POA: Diagnosis not present

## 2016-01-18 DIAGNOSIS — I2510 Atherosclerotic heart disease of native coronary artery without angina pectoris: Secondary | ICD-10-CM | POA: Diagnosis not present

## 2016-01-18 HISTORY — DX: Alcohol abuse, uncomplicated: F10.10

## 2016-01-18 LAB — BASIC METABOLIC PANEL
ANION GAP: 9 (ref 5–15)
BUN: 5 mg/dL — ABNORMAL LOW (ref 6–20)
CHLORIDE: 103 mmol/L (ref 101–111)
CO2: 27 mmol/L (ref 22–32)
Calcium: 8.1 mg/dL — ABNORMAL LOW (ref 8.9–10.3)
Creatinine, Ser: 0.69 mg/dL (ref 0.61–1.24)
GFR calc non Af Amer: >60 mL/min (ref >60)
GLUCOSE: 83 mg/dL (ref 65–99)
POTASSIUM: 3.1 mmol/L — AB (ref 3.5–5.1)
Sodium: 139 mmol/L (ref 135–145)

## 2016-01-18 LAB — HEPATIC FUNCTION PANEL
ALT: 13 U/L — AB (ref 17–63)
AST: 19 U/L (ref 15–41)
Albumin: 2.3 g/dL — ABNORMAL LOW (ref 3.5–5.0)
Alkaline Phosphatase: 90 U/L (ref 38–126)
BILIRUBIN DIRECT: 0.2 mg/dL (ref 0.1–0.5)
BILIRUBIN INDIRECT: 0.2 mg/dL — AB (ref 0.3–0.9)
BILIRUBIN TOTAL: 0.4 mg/dL (ref 0.3–1.2)
Total Protein: 5.2 g/dL — ABNORMAL LOW (ref 6.5–8.1)

## 2016-01-18 LAB — URINALYSIS, ROUTINE W REFLEX MICROSCOPIC
BILIRUBIN URINE: NEGATIVE
GLUCOSE, UA: NEGATIVE mg/dL
HGB URINE DIPSTICK: NEGATIVE
Ketones, ur: NEGATIVE mg/dL
Leukocytes, UA: NEGATIVE
Nitrite: NEGATIVE
Protein, ur: NEGATIVE mg/dL
SPECIFIC GRAVITY, URINE: 1.01 (ref 1.005–1.030)
pH: 5.5 (ref 5.0–8.0)

## 2016-01-18 LAB — ETHANOL: ALCOHOL ETHYL (B): 31 mg/dL — AB (ref <5)

## 2016-01-18 LAB — CBC
HEMATOCRIT: 38.8 % — AB (ref 39.0–52.0)
HEMOGLOBIN: 13.2 g/dL (ref 13.0–17.0)
MCH: 32.1 pg (ref 26.0–34.0)
MCHC: 34 g/dL (ref 30.0–36.0)
MCV: 94.4 fL (ref 78.0–100.0)
Platelets: 202 10*3/uL (ref 150–400)
RBC: 4.11 MIL/uL — AB (ref 4.22–5.81)
RDW: 14.2 % (ref 11.5–15.5)
WBC: 7.8 10*3/uL (ref 4.0–10.5)

## 2016-01-18 LAB — TROPONIN I

## 2016-01-18 LAB — BRAIN NATRIURETIC PEPTIDE: B NATRIURETIC PEPTIDE 5: 163 pg/mL — AB (ref 0.0–100.0)

## 2016-01-18 LAB — RAPID URINE DRUG SCREEN, HOSP PERFORMED
AMPHETAMINES: NOT DETECTED
BENZODIAZEPINES: POSITIVE — AB
Barbiturates: NOT DETECTED
Cocaine: NOT DETECTED
OPIATES: NOT DETECTED
TETRAHYDROCANNABINOL: POSITIVE — AB

## 2016-01-18 MED ORDER — POTASSIUM CHLORIDE CRYS ER 20 MEQ PO TBCR
40.0000 meq | EXTENDED_RELEASE_TABLET | Freq: Once | ORAL | Status: AC
  Administered 2016-01-18: 40 meq via ORAL
  Filled 2016-01-18: qty 2

## 2016-01-18 NOTE — ED Notes (Signed)
Pt brought in by [facility] c/o dizziness while standing, no LOC. Pt reports he sat down after he started feeling dizzy and then started to feel better. Pt reports several episodes of this since before Christmas but he has not been evaluated by MD. SBP 82 palpated by EMS upon arrival. IV attempted, but no success by EMS. SBP 102 for EMS upon arrival to ED. Pt denies dizziness at this time while sitting in bed.

## 2016-01-18 NOTE — ED Notes (Signed)
MD at bedside. 

## 2016-01-18 NOTE — ED Notes (Signed)
Pt walked around the nurses station with no assistance; pt denies any dizziness or sob; Dr. Thurnell Garbe informed.

## 2016-01-18 NOTE — ED Notes (Signed)
[employer] AICD interrogated.

## 2016-01-18 NOTE — Discharge Instructions (Signed)
Take your usual prescriptions as previously directed. Keep yourself hydrated. Move slowly when you change positions, ie: laying to sitting to standing.  Call your regular medical doctor and your Cardiologist tomorrow to schedule a follow up appointment within the next 3 days.  Return to the Emergency Department immediately sooner if worsening.

## 2016-01-18 NOTE — ED Provider Notes (Signed)
CSN: [id number]     Arrival date & time 01/18/16  1342 History   First MD Initiated Contact with Patient 01/18/16 1536     Chief Complaint  Patient presents with  . Dizziness      HPI Pt was seen at 1540. Per EMS and pt, c/o gradual onset and persistence of multiple intermittent episodes of lightheadedness for the past 4+ months. Pt states he moved from sitting to standing when he "felt lightheaded" today. States his Home Health aide took his BP and "said the top number was low." EMS states pt's SBP was "80/palp" on their arrival to scene. Pt states he has been told his episodes are "orthostatic" or "maybe because my inner ear is off." LD etoh this morning. Denies etoh withdrawal symptoms. Denies CP/palpitations, no SOB/cough, no abd pain, no N/V/D, no fevers, no focal motor weakness, no tingling/numbness in extremities, no facial droop, no slurred speech.    Past Medical History  Diagnosis Date  . Obesity   . Hypertension   . Hyperlipidemia   . Systolic heart failure   . Coronary artery disease     native vessel  . AICD (automatic cardioverter/defibrillator) present 2006    [place] 7232  . Ischemic cardiomyopathy     ischemic  . [place] Paroxysmal ventricular tachycardia ([place])     Rx via [facility] 2008  . Pulmonary embolism ([place])   . MI, old   . OSA (obstructive sleep apnea)     PCP aware, pt does not use.  . Depression   . Shortness of breath dyspnea   . GERD (gastroesophageal reflux disease)   . Arthritis     osteoarthritis  . Alcohol abuse    Past Surgical History  Procedure Laterality Date  . Cardiac defibrillator placement  2006    [employer] Max[employer] 7232  . Knee surgery Left     arthroscopy  . Implantable cardioverter defibrillator generator change N/A 09/02/2014    Procedure: IMPLANTABLE CARDIOVERTER DEFIBRILLATOR GENERATOR CHANGE;  Surgeon: Deboraha Sprang, MD;  Location: [facility] CATH LAB;  Service: Cardiovascular;  Laterality: N/A;  . Lead revision N/A  09/02/2014    Procedure: LEAD REVISION;  Surgeon: Deboraha Sprang, MD;  Location: [facility] CATH LAB;  Service: Cardiovascular;  Laterality: N/A;    Social History  Substance Use Topics  . Smoking status: Current Every Day Smoker -- 0.50 packs/day for 30 years    Types: Cigarettes  . Smokeless tobacco: Never Used     Comment: 3-4 cigarettes a day.  . Alcohol Use: 5.4 oz/week    9 Shots of liquor, 0 Standard drinks or equivalent per week     Comment: 1/2 pint rarely per pt - past hx of heavy alcoholism per pt, just stopped Feb 3//2017    Review of Systems ROS: Statement: All systems negative except as marked or noted in the HPI; Constitutional: Negative for fever and chills. ; ; Eyes: Negative for eye pain, redness and discharge. ; ; ENMT: Negative for ear pain, hoarseness, nasal congestion, sinus pressure and sore throat. ; ; Cardiovascular: Negative for chest pain, palpitations, diaphoresis, dyspnea and peripheral edema. ; ; Respiratory: Negative for cough, wheezing and stridor. ; ; Gastrointestinal: Negative for nausea, vomiting, diarrhea, abdominal pain, blood in stool, hematemesis, jaundice and rectal bleeding. . ; ; Genitourinary: Negative for dysuria, flank pain and hematuria. ; ; Musculoskeletal: Negative for back pain and neck pain. Negative for swelling and trauma.; ; Skin: Negative for pruritus, rash, abrasions,  blisters, bruising and skin lesion.; ; Neuro: +lightheadedness. Negative for headache and neck stiffness. Negative for weakness, altered level of consciousness , altered mental status, extremity weakness, paresthesias, involuntary movement, seizure and syncope.      Allergies  Review of patient's allergies indicates no known allergies.  Home Medications   Prior to Admission medications   Medication Sig Start Date End Date Taking? Authorizing Provider  acetaminophen (TYLENOL) 325 MG tablet Take 650 mg by mouth every 6 (six) hours as needed for moderate pain.   Yes Historical  Provider, MD  aspirin 81 MG EC tablet Take 81 mg by mouth every other day.  07/15/14  Yes Eileen Stanford, PA-C  bisoprolol (ZEBETA) 5 MG tablet Take 1 tablet by mouth 2 (two) times daily. 11/19/15  Yes Historical Provider, MD  cholecalciferol (VITAMIN D) 1000 UNITS tablet Take 1,000 Units by mouth daily.    Yes Historical Provider, MD  diazepam (VALIUM) 5 MG tablet Take 5-15 mg by mouth 2 (two) times daily. One tablet in the morning and three at bedtime   Yes Historical Provider, MD  folic acid (FOLVITE) 1 MG tablet Take 1 mg by mouth daily.  11/17/13  Yes Historical Provider, MD  nitroGLYCERIN (NITROSTAT) 0.4 MG SL tablet Place 0.4 mg under the tongue every 5 (five) minutes as needed for chest pain.   Yes Historical Provider, MD  omeprazole (PRILOSEC) 40 MG capsule Take 40 mg by mouth daily.   Yes Historical Provider, MD  polyethylene glycol-electrolytes (TRILYTE) 420 g solution Take 4,000 mLs by mouth once. 01/05/16  Yes Butch Penny, NP  rivaroxaban (XARELTO) 20 MG TABS tablet Take 20 mg by mouth daily with supper.   Yes Carole Civil, MD  spironolactone (ALDACTONE) 25 MG tablet Take 25 mg by mouth daily.   Yes Historical Provider, MD  tiotropium (SPIRIVA) 18 MCG inhalation capsule Place 18 mcg into inhaler and inhale daily as needed (shortness of breath).    Yes Historical Provider, MD   BP 108/83 mmHg  Pulse 87  Temp(Src) 97.8 F (36.6 C) (Oral)  Resp 17  Ht 6' (1.829 m)  Wt 195 lb (88.451 kg)  BMI 26.44 kg/m2  SpO2 97%   15:24:49 Orthostatic Vital Signs MW  Orthostatic Lying  - BP- Lying: 115/76 mmHg ; Pulse- Lying: 88  Orthostatic Sitting - BP- Sitting: 155/82 mmHg ; Pulse- Sitting: 98  Orthostatic Standing at 0 minutes - BP- Standing at 0 minutes: 116/88 mmHg ; Pulse- Standing at 0 minutes: 98      Physical Exam 1545: Physical examination:  Nursing notes reviewed; Vital signs and O2 SAT reviewed;  Constitutional: Well developed, Well nourished, Well hydrated, In no acute  distress; Head:  Normocephalic, atraumatic; Eyes: EOMI, PERRL, No scleral icterus; ENMT: Mouth and pharynx normal, Mucous membranes moist; Neck: Supple, Full range of motion, No lymphadenopathy; Cardiovascular: Regular rate and rhythm, No gallop; Respiratory: Breath sounds clear & equal bilaterally, No wheezes.  Speaking full sentences with ease, Normal respiratory effort/excursion; Chest: Nontender, Movement normal; Abdomen: Soft, Nontender, Nondistended, Normal bowel sounds; Genitourinary: No CVA tenderness; Extremities: Pulses normal, No tenderness, No edema, No calf edema or asymmetry.; Neuro: AA&Ox3, Major CN grossly intact. No facial droop. Speech clear. No gross focal motor or sensory deficits in extremities.; Skin: Color normal, Warm, Dry.    ED Course  Procedures (including critical care time) Labs Review  Imaging Review  I have personally reviewed and evaluated these images and lab results as part of my medical decision-making.  EKG Interpretation   Date/Time:  Tuesday January 18 2016 13:54:15 EDT Ventricular Rate:  89 PR Interval:  143 QRS Duration: 110 QT Interval:  380 QTC Calculation: 462 R Axis:   18 Text Interpretation:  Sinus rhythm Inferior infarct, old Probable lateral  infarct, age indeterminate When compared with ECG of 01/17/2016 No  significant change was found Confirmed by [facility]  MD, Nunzio Cory [phone]) on  01/18/2016 3:59:22 PM      MDM  MDM Reviewed: previous chart, nursing note and vitals Reviewed previous: labs and ECG Interpretation: labs, ECG, x-ray and CT scan      Results for orders placed or performed during the hospital encounter of [id number]  Basic metabolic panel  Result Value Ref Range   Sodium 139 135 - 145 mmol/L   Potassium 3.1 (L) 3.5 - 5.1 mmol/L   Chloride 103 101 - 111 mmol/L   CO2 27 22 - 32 mmol/L   Glucose, Bld 83 65 - 99 mg/dL   BUN 5 (L) 6 - 20 mg/dL   Creatinine, Ser 0.69 0.61 - 1.24 mg/dL   Calcium 8.1 (L) 8.9 - 10.3 mg/dL    GFR calc non Af Amer >60 >60 mL/min   GFR calc Af Amer >60 >60 mL/min   Anion gap 9 5 - 15  CBC  Result Value Ref Range   WBC 7.8 4.0 - 10.5 K/uL   RBC 4.11 (L) 4.22 - 5.81 MIL/uL   Hemoglobin 13.2 13.0 - 17.0 g/dL   HCT 38.8 (L) 39.0 - 52.0 %   MCV 94.4 78.0 - 100.0 fL   MCH 32.1 26.0 - 34.0 pg   MCHC 34.0 30.0 - 36.0 g/dL   RDW 14.2 11.5 - 15.5 %   Platelets 202 150 - 400 K/uL  Troponin I  Result Value Ref Range   Troponin I <0.03 <0.031 ng/mL  Ethanol  Result Value Ref Range   Alcohol, Ethyl (B) 31 (H) <5 mg/dL  Brain natriuretic peptide  Result Value Ref Range   B Natriuretic Peptide 163.0 (H) 0.0 - 100.0 pg/mL  Hepatic function panel  Result Value Ref Range   Total Protein 5.2 (L) 6.5 - 8.1 g/dL   Albumin 2.3 (L) 3.5 - 5.0 g/dL   AST 19 15 - 41 U/L   ALT 13 (L) 17 - 63 U/L   Alkaline Phosphatase 90 38 - 126 U/L   Total Bilirubin 0.4 0.3 - 1.2 mg/dL   Bilirubin, Direct 0.2 0.1 - 0.5 mg/dL   Indirect Bilirubin 0.2 (L) 0.3 - 0.9 mg/dL   Dg Chest 2 View 01/18/2016  CLINICAL DATA:  Recent dizziness EXAM: CHEST  2 VIEW COMPARISON:  12/21/2015 FINDINGS: Cardiac shadow is stable. A defibrillator is again seen and stable. The lungs are clear bilaterally. Mild thoracic degenerative changes are seen. No acute abnormality is noted. IMPRESSION: No acute abnormality seen. Electronically Signed   By: Inez Catalina M.D.   On: 01/18/2016 16:57   Ct Head Wo Contrast 01/18/2016  CLINICAL DATA:  Dizziness.  Lightheadedness. EXAM: CT HEAD WITHOUT CONTRAST TECHNIQUE: Contiguous axial images were obtained from the base of the skull through the vertex without intravenous contrast. COMPARISON:  03/14/2005 FINDINGS: The brainstem, cerebellum, cerebral peduncles, thalami, basal ganglia, basilar cisterns, and ventricular system appear within normal limits. No intracranial hemorrhage, mass lesion, or acute CVA. Chronic bilateral maxillary, ethmoid, sphenoid, and inferior frontal sinusitis. Mastoid air  cells and middle ears clear. IMPRESSION: 1. Mild chronic paranasal pansinusitis. Otherwise, no significant abnormalities are observed.  Electronically Signed   By: Van Clines M.D.   On: 01/18/2016 17:35    2015:  Potassium repleted PO. Pt has tol PO well without N/V. Neuro exam unchanged. Pt has slept most of his ED stay. AICD interrogated: no events. Pt initially stated he was "dizzy" during orthostatic VS, but pt ambulated without any complaints, Sats remaining 99% R/A. Pt has gotten himself dressed and wants to go home now. No clear indication for admission at this time; f/u PMD and Cards MD. Dx and testing d/w pt.  Questions answered.  Verb understanding, agreeable to d/c home with outpt f/u.    Francine Graven, DO 01/21/16 1754

## 2016-01-21 ENCOUNTER — Encounter (HOSPITAL_COMMUNITY): Payer: Self-pay | Admitting: *Deleted

## 2016-01-21 ENCOUNTER — Ambulatory Visit (HOSPITAL_COMMUNITY)
Admission: RE | Admit: 2016-01-21 | Discharge: 2016-01-21 | Disposition: A | Discharge status: Home / Self Care | Payer: Medicare Other | Source: Ambulatory Visit | Attending: Internal Medicine | Admitting: Internal Medicine

## 2016-01-21 ENCOUNTER — Encounter (HOSPITAL_COMMUNITY)
Admission: RE | Disposition: A | Discharge status: Home / Self Care | Payer: Self-pay | Source: Ambulatory Visit | Attending: Internal Medicine

## 2016-01-21 ENCOUNTER — Ambulatory Visit (HOSPITAL_COMMUNITY): Payer: Medicare Other | Admitting: Anesthesiology

## 2016-01-21 DIAGNOSIS — Z86711 Personal history of pulmonary embolism: Secondary | ICD-10-CM | POA: Diagnosis not present

## 2016-01-21 DIAGNOSIS — D123 Benign neoplasm of transverse colon: Secondary | ICD-10-CM | POA: Insufficient documentation

## 2016-01-21 DIAGNOSIS — G4733 Obstructive sleep apnea (adult) (pediatric): Secondary | ICD-10-CM | POA: Insufficient documentation

## 2016-01-21 DIAGNOSIS — K2210 Ulcer of esophagus without bleeding: Secondary | ICD-10-CM

## 2016-01-21 DIAGNOSIS — Z86010 Personal history of colonic polyps: Secondary | ICD-10-CM | POA: Diagnosis not present

## 2016-01-21 DIAGNOSIS — K219 Gastro-esophageal reflux disease without esophagitis: Secondary | ICD-10-CM | POA: Insufficient documentation

## 2016-01-21 DIAGNOSIS — K3189 Other diseases of stomach and duodenum: Secondary | ICD-10-CM | POA: Diagnosis not present

## 2016-01-21 DIAGNOSIS — E785 Hyperlipidemia, unspecified: Secondary | ICD-10-CM | POA: Diagnosis not present

## 2016-01-21 DIAGNOSIS — Z7982 Long term (current) use of aspirin: Secondary | ICD-10-CM | POA: Diagnosis not present

## 2016-01-21 DIAGNOSIS — I2510 Atherosclerotic heart disease of native coronary artery without angina pectoris: Secondary | ICD-10-CM | POA: Diagnosis not present

## 2016-01-21 DIAGNOSIS — K644 Residual hemorrhoidal skin tags: Secondary | ICD-10-CM | POA: Insufficient documentation

## 2016-01-21 DIAGNOSIS — Z7901 Long term (current) use of anticoagulants: Secondary | ICD-10-CM | POA: Insufficient documentation

## 2016-01-21 DIAGNOSIS — R1310 Dysphagia, unspecified: Secondary | ICD-10-CM | POA: Insufficient documentation

## 2016-01-21 DIAGNOSIS — Z95810 Presence of automatic (implantable) cardiac defibrillator: Secondary | ICD-10-CM | POA: Diagnosis not present

## 2016-01-21 DIAGNOSIS — I110 Hypertensive heart disease with heart failure: Secondary | ICD-10-CM | POA: Insufficient documentation

## 2016-01-21 DIAGNOSIS — D122 Benign neoplasm of ascending colon: Secondary | ICD-10-CM | POA: Diagnosis not present

## 2016-01-21 DIAGNOSIS — Z1211 Encounter for screening for malignant neoplasm of colon: Secondary | ICD-10-CM | POA: Diagnosis not present

## 2016-01-21 DIAGNOSIS — M1991 Primary osteoarthritis, unspecified site: Secondary | ICD-10-CM | POA: Diagnosis not present

## 2016-01-21 DIAGNOSIS — I5020 Unspecified systolic (congestive) heart failure: Secondary | ICD-10-CM | POA: Diagnosis not present

## 2016-01-21 HISTORY — PX: ESOPHAGEAL DILATION: SHX303

## 2016-01-21 HISTORY — PX: POLYPECTOMY: SHX5525

## 2016-01-21 HISTORY — PX: ESOPHAGOGASTRODUODENOSCOPY (EGD) WITH PROPOFOL: SHX5813

## 2016-01-21 HISTORY — PX: COLONOSCOPY WITH PROPOFOL: SHX5780

## 2016-01-21 HISTORY — PX: BIOPSY: SHX5522

## 2016-01-21 LAB — KOH PREP: KOH PREP: NONE SEEN

## 2016-01-21 SURGERY — ESOPHAGOGASTRODUODENOSCOPY (EGD) WITH PROPOFOL
Anesthesia: Monitor Anesthesia Care

## 2016-01-21 MED ORDER — MIDAZOLAM HCL 2 MG/2ML IJ SOLN
1.0000 mg | INTRAMUSCULAR | Status: DC | PRN
  Administered 2016-01-21: 2 mg via INTRAVENOUS

## 2016-01-21 MED ORDER — FENTANYL CITRATE (PF) 100 MCG/2ML IJ SOLN
INTRAMUSCULAR | Status: AC
  Filled 2016-01-21: qty 2

## 2016-01-21 MED ORDER — MIDAZOLAM HCL 2 MG/2ML IJ SOLN
INTRAMUSCULAR | Status: AC
  Filled 2016-01-21: qty 2

## 2016-01-21 MED ORDER — BUTAMBEN-TETRACAINE-BENZOCAINE 2-2-14 % EX AERO
2.0000 | INHALATION_SPRAY | Freq: Once | CUTANEOUS | Status: AC
  Administered 2016-01-21: 2 via TOPICAL

## 2016-01-21 MED ORDER — ASPIRIN 81 MG PO TBEC: 81.0000 mg | DELAYED_RELEASE_TABLET | ORAL | Status: DC

## 2016-01-21 MED ORDER — PROPOFOL 500 MG/50ML IV EMUL
INTRAVENOUS | Status: DC | PRN
  Administered 2016-01-21: 125 ug/kg/min via INTRAVENOUS
  Administered 2016-01-21 (×2): via INTRAVENOUS

## 2016-01-21 MED ORDER — PROPOFOL 10 MG/ML IV BOLUS
INTRAVENOUS | Status: AC
  Filled 2016-01-21: qty 20

## 2016-01-21 MED ORDER — PROPOFOL 10 MG/ML IV BOLUS
INTRAVENOUS | Status: DC | PRN
  Administered 2016-01-21: 10 mg via INTRAVENOUS

## 2016-01-21 MED ORDER — ONDANSETRON HCL 4 MG/2ML IJ SOLN: 4.0000 mg | Freq: Once | INTRAMUSCULAR | Status: DC | PRN

## 2016-01-21 MED ORDER — FENTANYL CITRATE (PF) 100 MCG/2ML IJ SOLN: 25.0000 ug | INTRAMUSCULAR | Status: DC | PRN

## 2016-01-21 MED ORDER — LACTATED RINGERS IV SOLN
INTRAVENOUS | Status: DC
  Administered 2016-01-21: 08:00:00 via INTRAVENOUS

## 2016-01-21 MED ORDER — FENTANYL CITRATE (PF) 100 MCG/2ML IJ SOLN
25.0000 ug | INTRAMUSCULAR | Status: AC
  Administered 2016-01-21 (×2): 25 ug via INTRAVENOUS

## 2016-01-21 MED ORDER — FENTANYL CITRATE (PF) 100 MCG/2ML IJ SOLN
INTRAMUSCULAR | Status: DC | PRN
  Administered 2016-01-21 (×2): 25 ug via INTRAVENOUS

## 2016-01-21 MED ORDER — SUCRALFATE 1 GM/10ML PO SUSP: 1.0000 g | Freq: Three times a day (TID) | ORAL | Status: AC

## 2016-01-21 NOTE — Anesthesia Postprocedure Evaluation (Signed)
Anesthesia Post Note  Patient: Stephen [name]  Procedure(s) Performed: Procedure(s) (LRB): ESOPHAGOGASTRODUODENOSCOPY (EGD) WITH PROPOFOL (N/A) ESOPHAGEAL DILATION (N/A) COLONOSCOPY WITH PROPOFOL (N/A) BIOPSY POLYPECTOMY  Patient location during evaluation: PACU Anesthesia Type: MAC Level of consciousness: awake and alert and oriented Pain management: pain level controlled Vital Signs Assessment: post-procedure vital signs reviewed and stable Respiratory status: spontaneous breathing Cardiovascular status: stable Postop Assessment: no signs of nausea or vomiting Anesthetic complications: no    Last Vitals:  Filed Vitals:   01/21/16 0810 01/21/16 0815  BP: 129/100 132/99  Temp:    Resp: 16 18    Last Pain: There were no vitals filed for this visit.               Tracker Mance

## 2016-01-21 NOTE — Anesthesia Preprocedure Evaluation (Addendum)
Anesthesia Evaluation  Patient identified by MRN, date of birth, ID band Patient awake    Reviewed: Allergy & Precautions, NPO status , Patient's Chart, lab work & pertinent test results  Airway Mallampati: I  TM Distance: >3 FB     Dental  (+) Poor Dentition, Missing, Chipped, Dental Advisory Given   Pulmonary shortness of breath and with exertion, sleep apnea , Current Smoker, PE   breath sounds clear to auscultation       Cardiovascular hypertension, Pt. on medications + CAD, + Past MI, +CHF and + DOE  + dysrhythmias + Cardiac Defibrillator  Rhythm:Regular Rate:Normal     Neuro/Psych PSYCHIATRIC DISORDERS Depression    GI/Hepatic GERD  ,(+)     substance abuse  alcohol use,   Endo/Other    Renal/GU      Musculoskeletal   Abdominal   Peds  Hematology   Anesthesia Other Findings   Reproductive/Obstetrics                            Anesthesia Physical Anesthesia Plan  ASA: IV  Anesthesia Plan: MAC   Post-op Pain Management:    Induction: Intravenous  Airway Management Planned: Simple Face Mask  Additional Equipment:   Intra-op Plan:   Post-operative Plan:   Informed Consent: I have reviewed the patients History and Physical, chart, labs and discussed the procedure including the risks, benefits and alternatives for the proposed anesthesia with the patient or authorized representative who has indicated his/her understanding and acceptance.     Plan Discussed with:   Anesthesia Plan Comments:         Anesthesia Quick Evaluation

## 2016-01-21 NOTE — Discharge Instructions (Signed)
Resume aspirin and Xeralto starting tomorrow evening. Resume other medications as before. Sucralfate 1 g by mouth 1 hour before each meal and at bedtime daily. Elevate head end of bed by at least 4 inches. Each evening meal at least three hours before going to bed. No driving for 24 hours. Patient will call with biopsy results and further recommendations. Office visit in 2 months. Colonoscopy, Care After Refer to this sheet in the next few weeks. These instructions provide you with information on caring for yourself after your procedure. Your health care provider may also give you more specific instructions. Your treatment has been planned according to current medical practices, but problems sometimes occur. Call your health care provider if you have any problems or questions after your procedure. WHAT TO EXPECT AFTER THE PROCEDURE  After your procedure, it is typical to have the following:  A small amount of blood in your stool.  Moderate amounts of gas and mild abdominal cramping or bloating. HOME CARE INSTRUCTIONS  Do not drive, operate machinery, or sign important documents for 24 hours.  You may shower and resume your regular physical activities, but move at a slower pace for the first 24 hours.  Take frequent rest periods for the first 24 hours.  Walk around or put a warm pack on your abdomen to help reduce abdominal cramping and bloating.  Drink enough fluids to keep your urine clear or pale yellow.  You may resume your normal diet as instructed by your health care provider. Avoid heavy or fried foods that are hard to digest.  Avoid drinking alcohol for 24 hours or as instructed by your health care provider.  Only take over-the-counter or prescription medicines as directed by your health care provider.  If a tissue sample (biopsy) was taken during your procedure:  Do not take aspirin or blood thinners for 7 days, or as instructed by your health care provider.  Do not drink  alcohol for 7 days, or as instructed by your health care provider.  Eat soft foods for the first 24 hours. SEEK MEDICAL CARE IF: You have persistent spotting of blood in your stool 2-3 days after the procedure. SEEK IMMEDIATE MEDICAL CARE IF:  You have more than a small spotting of blood in your stool.  You pass large blood clots in your stool.  Your abdomen is swollen (distended).  You have nausea or vomiting.  You have a fever.  You have increasing abdominal pain that is not relieved with medicine.   This information is not intended to replace advice given to you by your health care provider. Make sure you discuss any questions you have with your health care provider.   Document Released: 06/06/2004 Document Revised: 08/13/2013 Document Reviewed: 06/30/2013 Elsevier Interactive Patient Education 2016 [employer]. Gastrointestinal Endoscopy, Care After Refer to this sheet in the next few weeks. These instructions provide you with information on caring for yourself after your procedure. Your caregiver may also give you more specific instructions. Your treatment has been planned according to current medical practices, but problems sometimes occur. Call your caregiver if you have any problems or questions after your procedure. HOME CARE INSTRUCTIONS  If you were given medicine to help you relax (sedative), do not drive, operate machinery, or sign important documents for 24 hours.  Avoid alcohol and hot or warm beverages for the first 24 hours after the procedure.  Only take over-the-counter or prescription medicines for pain, discomfort, or fever as directed by your caregiver. You may  resume taking your normal medicines unless your caregiver tells you otherwise. Ask your caregiver when you may resume taking medicines that may cause bleeding, such as aspirin, clopidogrel, or warfarin.  You may return to your normal diet and activities on the day after your procedure, or as directed by  your caregiver. Walking may help to reduce any bloated feeling in your abdomen.  Drink enough fluids to keep your urine clear or pale yellow.  You may gargle with salt water if you have a sore throat. SEEK IMMEDIATE MEDICAL CARE IF:  You have severe nausea or vomiting.  You have severe abdominal pain, abdominal cramps that last longer than 6 hours, or abdominal swelling (distention).  You have severe shoulder or back pain.  You have trouble swallowing.  You have shortness of breath, your breathing is shallow, or you are breathing faster than normal.  You have a fever or a rapid heartbeat.  You vomit blood or material that looks like coffee grounds.  You have bloody, black, or tarry stools. MAKE SURE YOU:  Understand these instructions.  Will watch your condition.  Will get help right away if you are not doing well or get worse.   This information is not intended to replace advice given to you by your health care provider. Make sure you discuss any questions you have with your health care provider.   Document Released: 06/06/2004 Document Revised: 11/13/2014 Document Reviewed: 01/23/2012 Elsevier Interactive Patient Education [employer].

## 2016-01-21 NOTE — Op Note (Signed)
[facility] Patient Name: Stephen [name] Procedure Date: 01/21/2016 8:51 AM MRN: [id number] Date of Birth: 27-Sep-1961 Attending MD: Hildred Laser , MD CSN: [id number] Age: 55 Admit Type: Outpatient Procedure:                Colonoscopy Indications:              High risk colon cancer surveillance: Personal                            history of colonic polyps, Last colonoscopy: 2009 Providers:                Hildred Laser, MD, Renda Rolls, RN, Randa Spike,                            Technician Referring MD:             [name], MD Medicines:                Monitored Anesthesia Care Complications:            No immediate complications. Estimated Blood Loss:     Estimated blood loss was minimal. Procedure:                Pre-Anesthesia Assessment:                           - Prior to the procedure, a History and Physical                            was performed, and patient medications and                            allergies were reviewed. The patient's tolerance of                            previous anesthesia was also reviewed. The risks                            and benefits of the procedure and the sedation                            options and risks were discussed with the patient.                            All questions were answered, and informed consent                            was obtained. Prior Anticoagulants: The patient                            last took aspirin 2 days and Xarelto (rivaroxaban)                            2 days prior to the procedure. ASA Grade  Assessment: III - A patient with severe systemic                            disease. After reviewing the risks and benefits,                            the patient was deemed in satisfactory condition to                            undergo the procedure.                           After obtaining informed consent, the colonoscope                            was  passed under direct vision. Throughout the                            procedure, the patient's blood pressure, pulse, and                            oxygen saturations were monitored continuously. The                            [id number]) scope was introduced through                            the anus and advanced to the the cecum, identified                            by appendiceal orifice and ileocecal valve. The                            colonoscopy was performed without difficulty. The                            patient tolerated the procedure well. The quality                            of the bowel preparation was good. The ileocecal                            valve, appendiceal orifice, and rectum were                            photographed. Scope In: 8:58:17 AM Scope Out: 9:23:19 AM Scope Withdrawal Time: 0 hours 21 minutes 31 seconds  Total Procedure Duration: 0 hours 25 minutes 2 seconds  Findings:      A 5 mm polyp was found in the ascending colon. The polyp was sessile.       The polyp was removed with a saline injection-lift technique using a hot       snare. Resection and retrieval were complete. Estimated blood loss was       minimal.  Five sessile polyps were found in the transverse colon. The polyps were       5 to 7 mm in size. These polyps were removed with a lift and cut       technique using a hot snare. Resection and retrieval were complete.       Estimated blood loss was minimal.      A 3 mm polyp was found in the transverse colon. The polyp was sessile.       Biopsies were taken with a cold forceps for histology. Estimated blood       loss was minimal.      Non-bleeding external hemorrhoids were found during retroflexion. The       hemorrhoids were medium-sized. Impression:               - One 5 mm polyp in the ascending colon, removed                            using injection-lift and a hot snare. Resected and                             retrieved.                           - Five 5 to 7 mm polyps in the transverse colon,                            removed using lift and cut and a hot snare.                            Resected and retrieved.                           - One 3 mm polyp in the transverse colon. Biopsied.                           - All 6 polyps were submitted together.                           - Non-bleeding external hemorrhoids. Moderate Sedation:      Moderate (conscious) sedation was personally administered by an       anesthesia professional. The following parameters were monitored: oxygen       saturation, heart rate, blood pressure, CO2 capnography and response to       care. Total physician intraservice time was 23 minutes. Recommendation:           - Patient has a contact number available for                            emergencies. The signs and symptoms of potential                            delayed complications were discussed with the                            patient. Return to normal activities tomorrow.  Written discharge instructions were provided to the                            patient.                           - Mechanical soft diet today.                           - Continue present medications.                           - Resume aspirin tomorrow and Xarelto (rivaroxaban)                            tomorrow at prior doses.                           - Await pathology results.                           - Repeat colonoscopy for surveillance based on                            pathology results.                           - Return to GI office in 2 months. Procedure Code(s):        --- Professional ---                           [phone], Colonoscopy, flexible; with removal of                            tumor(s), polyp(s), or other lesion(s) by snare                            technique                           [id number], Colonoscopy, flexible; with directed                             submucosal injection(s), any substance                           [id number], 61, Colonoscopy, flexible; with biopsy,                            single or multiple Diagnosis Code(s):        --- Professional ---                           Z86.010, Personal history of colonic polyps                           D12.2, Benign neoplasm of ascending colon  D12.3, Benign neoplasm of transverse colon (hepatic                            flexure or splenic flexure)                           K64.4, Residual hemorrhoidal skin tags CPT copyright 2016 American [employer]. All rights reserved. The codes documented in this report are preliminary and upon coder review may  be revised to meet current compliance requirements. Hildred Laser, MD Hildred Laser, MD 01/21/2016 10:03:38 AM This report has been signed electronically. Number of Addenda: 0

## 2016-01-21 NOTE — Transfer of Care (Signed)
Immediate Anesthesia Transfer of Care Note  Patient: Stephen [name]  Procedure(s) Performed: Procedure(s) with comments: ESOPHAGOGASTRODUODENOSCOPY (EGD) WITH PROPOFOL (N/A) - 8:30 - to be done under [facility] ESOPHAGEAL DILATION (N/A) COLONOSCOPY WITH PROPOFOL (N/A) BIOPSY - Esophageal biopsies with brushings POLYPECTOMY - Ascending colon polyp removed via cold snare  Patient Location: PACU  Anesthesia Type:MAC  Level of Consciousness: awake  Airway & Oxygen Therapy: Patient Spontanous Breathing  Post-op Assessment: Report given to RN  Post vital signs: Reviewed  Last Vitals:  Filed Vitals:   01/21/16 0810 01/21/16 0815  BP: 129/100 132/99  Temp:    Resp: 16 18    Complications: No apparent anesthesia complications

## 2016-01-21 NOTE — Op Note (Signed)
[facility] Patient Name: Stephen [name] Procedure Date: 01/21/2016 7:54 AM MRN: [id number] Date of Birth: 1961/02/25 Attending MD: Hildred Laser , MD CSN: [id number] Age: 55 Admit Type: Outpatient Procedure:                Upper GI endoscopy Indications:              Dysphagia, Gastro-esophageal reflux disease Providers:                Hildred Laser, MD, Renda Rolls, RN, Randa Spike,                            Technician Referring MD:             [name], MD Medicines:                Monitored Anesthesia Care Complications:            No immediate complications. Estimated Blood Loss:     Estimated blood loss was minimal. Procedure:                Pre-Anesthesia Assessment:                           - Prior to the procedure, a History and Physical                            was performed, and patient medications and                            allergies were reviewed. The patient's tolerance of                            previous anesthesia was also reviewed. The risks                            and benefits of the procedure and the sedation                            options and risks were discussed with the patient.                            All questions were answered, and informed consent                            was obtained. Prior Anticoagulants: The patient                            last took aspirin 2 days and Xarelto (rivaroxaban)                            2 days prior to the procedure. ASA Grade                            Assessment: III - A patient with severe systemic  disease. After reviewing the risks and benefits,                            the patient was deemed in satisfactory condition to                            undergo the procedure.                           After obtaining informed consent, the endoscope was                            passed under direct vision. Throughout the   procedure, the patient's blood pressure, pulse, and                            oxygen saturations were monitored continuously. The                            [id number]) scope was introduced through the                            mouth, and advanced to the second part of duodenum.                            The upper GI endoscopy was accomplished without                            difficulty. The patient tolerated the procedure                            well. Scope In: 8:40:31 AM Scope Out: 8:50:21 AM Total Procedure Duration: 0 hours 9 minutes 50 seconds  Findings:      The upper third of the esophagus was normal.      Five superficial esophageal ulcers with no bleeding and no stigmata of       recent bleeding were found 30 cm from the incisors. The largest lesion       was 40 mm in largest dimension. Biopsies were taken with a cold forceps       for histology.      The Z-line was regular and was found 41 cm from the incisors.      The entire examined stomach was normal.      Patchy mildly erythematous mucosa without active bleeding and with no       stigmata of bleeding was found in the duodenal bulb.      The nasopharynx was normal. Impression:               - Normal upper third of esophagus.                           - Non-bleeding esophageal ulcers. Biopsied.                           - Z-line regular, 41 cm from the incisors.                           -  Normal stomach.                           - Erythematous duodenopathy.                           - Normal nasopharynx. Moderate Sedation:      Moderate (conscious) sedation was personally administered by an       anesthesia professional. The following parameters were monitored: oxygen       saturation, heart rate, blood pressure, CO2 capnography and response to       care. Total physician intraservice time was 10 minutes. Recommendation:           - Patient has a contact number available for                             emergencies. The signs and symptoms of potential                            delayed complications were discussed with the                            patient. Return to normal activities tomorrow.                            Written discharge instructions were provided to the                            patient.                           - Resume aspirin tomorrow and Xarelto (rivaroxaban)                            tomorrow at prior doses.                           - Use sucralfate suspension 1 gram PO QID today.                           - Await pathology results.                           - Mechanical soft diet today. Procedure Code(s):        --- Professional ---                           [phone], Esophagogastroduodenoscopy, flexible,                            transoral; with biopsy, single or multiple Diagnosis Code(s):        --- Professional ---                           K22.10, Ulcer of esophagus without bleeding  K31.89, Other diseases of stomach and duodenum                           R13.10, Dysphagia, unspecified                           K21.9, Gastro-esophageal reflux disease without                            esophagitis CPT copyright 2016 American Medical Association. All rights reserved. The codes documented in this report are preliminary and upon coder review may  be revised to meet current compliance requirements. Hildred Laser, MD Hildred Laser, MD 01/21/2016 9:54:19 AM This report has been signed electronically. Number of Addenda: 0

## 2016-01-21 NOTE — H&P (Signed)
Stephen [name] is an 55 y.o. male.   Chief Complaint: Patient is here for EGD, ED and colonoscopy. HPI: Patient is 55 year old Caucasian male with multiple medical problems including ischemic cardiomyopathy who has AICD is here for EGD EGD and colonoscopy. He has chronic GERD. Heartburn is well controlled with omeprazole. He's been having dysphagia for about 3 months. He points to suprasternal area soft bolus obstruction. He has difficulty with solids as well as liquids. He has not had any episode of food impaction. He denies nausea vomiting or melena. He has history of cecal tubular adenoma. Last colonoscopy was in July 2009 under fluoroscopy at [facility] in [facility]. He formed loop in region of hepatic flexure and abdominal pressure made the difference. He states he has lost several pounds since he had swallowing difficulty.  He also has history of alcohol abuse and was last hospitalized 5 weeks ago for alcohol withdrawal. 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 Platelet count 3 days ago was 202K.  Past Medical History  Diagnosis Date  . Obesity   . Hypertension   . Hyperlipidemia   . Systolic heart failure   . Coronary artery disease     native vessel  . AICD (automatic cardioverter/defibrillator) present 2006    [place] 7232  . Ischemic cardiomyopathy     ischemic  . [place] Paroxysmal ventricular tachycardia ([place])     Rx via [facility] 2008  . Pulmonary embolism ([place])   . MI, old   . OSA (obstructive sleep apnea)     PCP aware, pt does not use.  . Depression   . Shortness of breath dyspnea   . GERD (gastroesophageal reflux disease)   . Arthritis     osteoarthritis  . Alcohol abuse     Past Surgical History  Procedure Laterality Date  . Cardiac defibrillator placement  2006    [employer] 7232  . Knee surgery Left     arthroscopy  . Implantable cardioverter defibrillator generator change N/A 09/02/2014   Procedure: IMPLANTABLE CARDIOVERTER DEFIBRILLATOR GENERATOR CHANGE;  Surgeon: Deboraha Sprang, MD;  Location: [facility] CATH LAB;  Service: Cardiovascular;  Laterality: N/A;  . Lead revision N/A 09/02/2014    Procedure: LEAD REVISION;  Surgeon: Deboraha Sprang, MD;  Location: [facility] CATH LAB;  Service: Cardiovascular;  Laterality: N/A;    History reviewed. No pertinent family history. Social History:  reports that he has been smoking Cigarettes.  He has a 15 pack-year smoking history. He has never used smokeless tobacco. He reports that he drinks about 5.4 oz of alcohol per week. He reports that he uses illicit drugs (Marijuana).  Allergies: No Known Allergies  Medications Prior to Admission  Medication Sig Dispense Refill  . acetaminophen (TYLENOL) 325 MG tablet Take 650 mg by mouth every 6 (six) hours as needed for moderate pain.    Marland Kitchen aspirin 81 MG EC tablet Take 81 mg by mouth every other day.     . bisoprolol (ZEBETA) 5 MG tablet Take 1 tablet by mouth 2 (two) times daily.    . cholecalciferol (VITAMIN D) 1000 UNITS tablet Take 1,000 Units by mouth daily.     . diazepam (VALIUM) 5 MG tablet Take 5-15 mg by mouth 2 (two) times daily. One tablet in the morning and three at bedtime    . folic acid (FOLVITE) 1 MG tablet Take 1 mg by mouth daily.     . nitroGLYCERIN (NITROSTAT) 0.4  MG SL tablet Place 0.4 mg under the tongue every 5 (five) minutes as needed for chest pain.    Marland Kitchen omeprazole (PRILOSEC) 40 MG capsule Take 40 mg by mouth daily.    . polyethylene glycol-electrolytes (TRILYTE) 420 g solution Take 4,000 mLs by mouth once. 4000 mL 0  . rivaroxaban (XARELTO) 20 MG TABS tablet Take 20 mg by mouth daily with supper.    Marland Kitchen spironolactone (ALDACTONE) 25 MG tablet Take 25 mg by mouth daily.    Marland Kitchen tiotropium (SPIRIVA) 18 MCG inhalation capsule Place 18 mcg into inhaler and inhale daily as needed (shortness of breath).       No results found for this or any previous visit (from the past 48 hour(s)). No  results found.  ROS  Temperature 98.8 F (37.1 C), temperature source Oral. Physical Exam  Constitutional: He appears well-developed and well-nourished.  HENT:  Mouth/Throat: Oropharynx is clear and moist.  Eyes: Conjunctivae are normal. No scleral icterus.  Neck: No thyromegaly present.  Cardiovascular: Normal rate, regular rhythm and normal heart sounds.   No murmur heard. Respiratory: Effort normal and breath sounds normal.  GI:  Abdomen is symmetrical and soft with mild tenderness at RLQ. No organomegaly or masses.  Musculoskeletal: He exhibits no edema.  Lymphadenopathy:    He has no cervical adenopathy.  Neurological: He is alert.  Skin: Skin is warm and dry.     Assessment/Plan Dysphagia in a patient with chronic GERD. History of cecal tubular adenoma. EGD with ED and surveillance colonoscopy.  Rogene Houston, MD 01/21/2016, 7:58 AM

## 2016-01-24 ENCOUNTER — Encounter (INDEPENDENT_AMBULATORY_CARE_PROVIDER_SITE_OTHER): Payer: Self-pay | Admitting: Internal Medicine

## 2016-01-27 ENCOUNTER — Encounter (HOSPITAL_COMMUNITY): Payer: Self-pay | Admitting: Internal Medicine

## 2016-03-06 DIAGNOSIS — E871 Hypo-osmolality and hyponatremia: Secondary | ICD-10-CM

## 2016-03-06 HISTORY — DX: Hypo-osmolality and hyponatremia: E87.1

## 2016-03-16 ENCOUNTER — Inpatient Hospital Stay (HOSPITAL_COMMUNITY)
Admission: EM | Admit: 2016-03-16 | Discharge: 2016-03-20 | DRG: 683 | Disposition: A | Discharge status: Home / Self Care | Payer: Medicare Other | Attending: Internal Medicine | Admitting: Internal Medicine

## 2016-03-16 ENCOUNTER — Encounter (HOSPITAL_COMMUNITY): Payer: Self-pay | Admitting: Emergency Medicine

## 2016-03-16 ENCOUNTER — Emergency Department (HOSPITAL_COMMUNITY): Payer: Medicare Other

## 2016-03-16 DIAGNOSIS — I255 Ischemic cardiomyopathy: Secondary | ICD-10-CM | POA: Diagnosis present

## 2016-03-16 DIAGNOSIS — G4733 Obstructive sleep apnea (adult) (pediatric): Secondary | ICD-10-CM | POA: Diagnosis present

## 2016-03-16 DIAGNOSIS — I959 Hypotension, unspecified: Secondary | ICD-10-CM | POA: Diagnosis not present

## 2016-03-16 DIAGNOSIS — F1010 Alcohol abuse, uncomplicated: Secondary | ICD-10-CM | POA: Diagnosis present

## 2016-03-16 DIAGNOSIS — I10 Essential (primary) hypertension: Secondary | ICD-10-CM | POA: Diagnosis present

## 2016-03-16 DIAGNOSIS — E871 Hypo-osmolality and hyponatremia: Secondary | ICD-10-CM | POA: Diagnosis present

## 2016-03-16 DIAGNOSIS — I252 Old myocardial infarction: Secondary | ICD-10-CM | POA: Diagnosis not present

## 2016-03-16 DIAGNOSIS — I951 Orthostatic hypotension: Secondary | ICD-10-CM | POA: Diagnosis present

## 2016-03-16 DIAGNOSIS — E861 Hypovolemia: Secondary | ICD-10-CM | POA: Diagnosis present

## 2016-03-16 DIAGNOSIS — I110 Hypertensive heart disease with heart failure: Secondary | ICD-10-CM | POA: Diagnosis present

## 2016-03-16 DIAGNOSIS — R778 Other specified abnormalities of plasma proteins: Secondary | ICD-10-CM | POA: Diagnosis present

## 2016-03-16 DIAGNOSIS — M1990 Unspecified osteoarthritis, unspecified site: Secondary | ICD-10-CM | POA: Diagnosis present

## 2016-03-16 DIAGNOSIS — I509 Heart failure, unspecified: Secondary | ICD-10-CM | POA: Diagnosis not present

## 2016-03-16 DIAGNOSIS — R7989 Other specified abnormal findings of blood chemistry: Secondary | ICD-10-CM | POA: Diagnosis present

## 2016-03-16 DIAGNOSIS — R000 Tachycardia, unspecified: Secondary | ICD-10-CM

## 2016-03-16 DIAGNOSIS — F17200 Nicotine dependence, unspecified, uncomplicated: Secondary | ICD-10-CM | POA: Diagnosis present

## 2016-03-16 DIAGNOSIS — N179 Acute kidney failure, unspecified: Secondary | ICD-10-CM | POA: Diagnosis present

## 2016-03-16 DIAGNOSIS — D638 Anemia in other chronic diseases classified elsewhere: Secondary | ICD-10-CM | POA: Diagnosis present

## 2016-03-16 DIAGNOSIS — Z7982 Long term (current) use of aspirin: Secondary | ICD-10-CM | POA: Diagnosis not present

## 2016-03-16 DIAGNOSIS — F17210 Nicotine dependence, cigarettes, uncomplicated: Secondary | ICD-10-CM | POA: Diagnosis present

## 2016-03-16 DIAGNOSIS — I472 Ventricular tachycardia: Secondary | ICD-10-CM | POA: Diagnosis present

## 2016-03-16 DIAGNOSIS — R55 Syncope and collapse: Secondary | ICD-10-CM | POA: Diagnosis present

## 2016-03-16 DIAGNOSIS — Z86711 Personal history of pulmonary embolism: Secondary | ICD-10-CM

## 2016-03-16 DIAGNOSIS — I5022 Chronic systolic (congestive) heart failure: Secondary | ICD-10-CM | POA: Diagnosis present

## 2016-03-16 DIAGNOSIS — E876 Hypokalemia: Secondary | ICD-10-CM | POA: Diagnosis present

## 2016-03-16 DIAGNOSIS — B370 Candidal stomatitis: Secondary | ICD-10-CM | POA: Diagnosis present

## 2016-03-16 DIAGNOSIS — E785 Hyperlipidemia, unspecified: Secondary | ICD-10-CM | POA: Diagnosis present

## 2016-03-16 DIAGNOSIS — I2510 Atherosclerotic heart disease of native coronary artery without angina pectoris: Secondary | ICD-10-CM | POA: Diagnosis present

## 2016-03-16 DIAGNOSIS — B958 Unspecified staphylococcus as the cause of diseases classified elsewhere: Secondary | ICD-10-CM | POA: Diagnosis present

## 2016-03-16 DIAGNOSIS — K219 Gastro-esophageal reflux disease without esophagitis: Secondary | ICD-10-CM | POA: Diagnosis present

## 2016-03-16 DIAGNOSIS — I9589 Other hypotension: Secondary | ICD-10-CM | POA: Diagnosis not present

## 2016-03-16 DIAGNOSIS — D696 Thrombocytopenia, unspecified: Secondary | ICD-10-CM | POA: Diagnosis present

## 2016-03-16 DIAGNOSIS — Z7901 Long term (current) use of anticoagulants: Secondary | ICD-10-CM

## 2016-03-16 DIAGNOSIS — Z95810 Presence of automatic (implantable) cardiac defibrillator: Secondary | ICD-10-CM | POA: Diagnosis not present

## 2016-03-16 DIAGNOSIS — Z8719 Personal history of other diseases of the digestive system: Secondary | ICD-10-CM

## 2016-03-16 DIAGNOSIS — E860 Dehydration: Secondary | ICD-10-CM | POA: Diagnosis present

## 2016-03-16 DIAGNOSIS — R748 Abnormal levels of other serum enzymes: Secondary | ICD-10-CM

## 2016-03-16 DIAGNOSIS — I480 Paroxysmal atrial fibrillation: Secondary | ICD-10-CM | POA: Diagnosis present

## 2016-03-16 HISTORY — DX: Chronic systolic (congestive) heart failure: I50.22

## 2016-03-16 HISTORY — DX: Hypo-osmolality and hyponatremia: E87.1

## 2016-03-16 LAB — COMPREHENSIVE METABOLIC PANEL
ALBUMIN: 2 g/dL — AB (ref 3.5–5.0)
ALK PHOS: 115 U/L (ref 38–126)
ALT: 22 U/L (ref 17–63)
AST: 36 U/L (ref 15–41)
Anion gap: 18 — ABNORMAL HIGH (ref 5–15)
BILIRUBIN TOTAL: 1.6 mg/dL — AB (ref 0.3–1.2)
BUN: 22 mg/dL — AB (ref 6–20)
CALCIUM: 6.8 mg/dL — AB (ref 8.9–10.3)
CO2: 26 mmol/L (ref 22–32)
Chloride: 81 mmol/L — ABNORMAL LOW (ref 101–111)
Creatinine, Ser: 1.44 mg/dL — ABNORMAL HIGH (ref 0.61–1.24)
GFR calc Af Amer: >60 mL/min (ref >60)
GFR calc non Af Amer: 53 mL/min — ABNORMAL LOW (ref >60)
GLUCOSE: 121 mg/dL — AB (ref 65–99)
POTASSIUM: 3.5 mmol/L (ref 3.5–5.1)
Sodium: 125 mmol/L — ABNORMAL LOW (ref 135–145)
TOTAL PROTEIN: 5.5 g/dL — AB (ref 6.5–8.1)

## 2016-03-16 LAB — CBC WITH DIFFERENTIAL/PLATELET
BASOS ABS: 0 10*3/uL (ref 0.0–0.1)
BASOS PCT: 0 %
Eosinophils Absolute: 0 10*3/uL (ref 0.0–0.7)
Eosinophils Relative: 0 %
HEMATOCRIT: 33.5 % — AB (ref 39.0–52.0)
HEMOGLOBIN: 11.8 g/dL — AB (ref 13.0–17.0)
Lymphocytes Relative: 10 %
Lymphs Abs: 1.6 10*3/uL (ref 0.7–4.0)
MCH: 31.6 pg (ref 26.0–34.0)
MCHC: 35.2 g/dL (ref 30.0–36.0)
MCV: 89.8 fL (ref 78.0–100.0)
Monocytes Absolute: 1 10*3/uL (ref 0.1–1.0)
Monocytes Relative: 7 %
NEUTROS ABS: 12.6 10*3/uL — AB (ref 1.7–7.7)
NEUTROS PCT: 83 %
Platelets: 87 10*3/uL — ABNORMAL LOW (ref 150–400)
RBC: 3.73 MIL/uL — AB (ref 4.22–5.81)
RDW: 15.9 % — ABNORMAL HIGH (ref 11.5–15.5)
WBC: 15.2 10*3/uL — ABNORMAL HIGH (ref 4.0–10.5)

## 2016-03-16 LAB — BRAIN NATRIURETIC PEPTIDE: B Natriuretic Peptide: 168.1 pg/mL — ABNORMAL HIGH (ref 0.0–100.0)

## 2016-03-16 LAB — ETHANOL: Alcohol, Ethyl (B): <5 mg/dL (ref <5)

## 2016-03-16 LAB — I-STAT CG4 LACTIC ACID, ED: LACTIC ACID, VENOUS: 4.25 mmol/L — AB (ref 0.5–2.0)

## 2016-03-16 LAB — APTT: APTT: 30 s (ref 24–37)

## 2016-03-16 LAB — TROPONIN I: Troponin I: 0.06 ng/mL — ABNORMAL HIGH (ref <0.031)

## 2016-03-16 MED ORDER — RIVAROXABAN 20 MG PO TABS
20.0000 mg | ORAL_TABLET | Freq: Every day | ORAL | Status: DC
Start: 2016-03-17 — End: 2016-03-20
  Administered 2016-03-17 – 2016-03-19 (×3): 20 mg via ORAL
  Filled 2016-03-16 (×3): qty 1

## 2016-03-16 MED ORDER — PIPERACILLIN-TAZOBACTAM 3.375 G IVPB 30 MIN
3.3750 g | Freq: Once | INTRAVENOUS | Status: AC
  Administered 2016-03-16: 3.375 g via INTRAVENOUS
  Filled 2016-03-16: qty 50

## 2016-03-16 MED ORDER — PROMETHAZINE HCL 25 MG PO TABS
12.5000 mg | ORAL_TABLET | Freq: Four times a day (QID) | ORAL | Status: DC | PRN
  Administered 2016-03-17: 12.5 mg via ORAL
  Filled 2016-03-16: qty 1

## 2016-03-16 MED ORDER — SACCHAROMYCES BOULARDII 250 MG PO CAPS
250.0000 mg | ORAL_CAPSULE | Freq: Two times a day (BID) | ORAL | Status: DC
  Administered 2016-03-17 – 2016-03-20 (×7): 250 mg via ORAL
  Filled 2016-03-16 (×8): qty 1

## 2016-03-16 MED ORDER — ADULT MULTIVITAMIN W/MINERALS CH
1.0000 | ORAL_TABLET | Freq: Every day | ORAL | Status: DC
  Administered 2016-03-17 – 2016-03-20 (×4): 1 via ORAL
  Filled 2016-03-16 (×4): qty 1

## 2016-03-16 MED ORDER — SODIUM CHLORIDE 0.9% FLUSH
3.0000 mL | Freq: Two times a day (BID) | INTRAVENOUS | Status: DC
  Administered 2016-03-17 – 2016-03-20 (×4): 3 mL via INTRAVENOUS

## 2016-03-16 MED ORDER — PANTOPRAZOLE SODIUM 40 MG PO TBEC
80.0000 mg | DELAYED_RELEASE_TABLET | Freq: Every day | ORAL | Status: DC
  Administered 2016-03-17 – 2016-03-20 (×4): 80 mg via ORAL
  Filled 2016-03-16 (×4): qty 2

## 2016-03-16 MED ORDER — PANTOPRAZOLE SODIUM 40 MG IV SOLR
40.0000 mg | Freq: Once | INTRAVENOUS | Status: AC
  Administered 2016-03-16: 40 mg via INTRAVENOUS
  Filled 2016-03-16: qty 40

## 2016-03-16 MED ORDER — SODIUM CHLORIDE 0.9 % IV BOLUS (SEPSIS)
1000.0000 mL | Freq: Once | INTRAVENOUS | Status: AC
  Administered 2016-03-16: 1000 mL via INTRAVENOUS

## 2016-03-16 MED ORDER — SODIUM CHLORIDE 0.9 % IV BOLUS (SEPSIS): 500.0000 mL | Freq: Once | INTRAVENOUS | Status: DC

## 2016-03-16 MED ORDER — VANCOMYCIN HCL IN DEXTROSE 1-5 GM/200ML-% IV SOLN
1000.0000 mg | Freq: Once | INTRAVENOUS | Status: DC
  Filled 2016-03-16: qty 200

## 2016-03-16 MED ORDER — FOLIC ACID 1 MG PO TABS
1.0000 mg | ORAL_TABLET | Freq: Every day | ORAL | Status: DC
Start: 2016-03-17 — End: 2016-03-17

## 2016-03-16 MED ORDER — LORAZEPAM 1 MG PO TABS: 1.0000 mg | ORAL_TABLET | Freq: Four times a day (QID) | ORAL | Status: AC | PRN

## 2016-03-16 MED ORDER — ACETAMINOPHEN 325 MG PO TABS: 650.0000 mg | ORAL_TABLET | Freq: Four times a day (QID) | ORAL | Status: DC | PRN

## 2016-03-16 MED ORDER — FOLIC ACID 1 MG PO TABS
1.0000 mg | ORAL_TABLET | Freq: Every day | ORAL | Status: DC
  Administered 2016-03-17 – 2016-03-20 (×4): 1 mg via ORAL
  Filled 2016-03-16 (×4): qty 1

## 2016-03-16 MED ORDER — THIAMINE HCL 100 MG/ML IJ SOLN: 100.0000 mg | Freq: Every day | INTRAMUSCULAR | Status: DC

## 2016-03-16 MED ORDER — ASPIRIN 81 MG PO CHEW: 324.0000 mg | CHEWABLE_TABLET | Freq: Once | ORAL | Status: DC

## 2016-03-16 MED ORDER — VANCOMYCIN HCL IN DEXTROSE 1-5 GM/200ML-% IV SOLN
1000.0000 mg | Freq: Two times a day (BID) | INTRAVENOUS | Status: DC
  Administered 2016-03-17 – 2016-03-19 (×5): 1000 mg via INTRAVENOUS
  Filled 2016-03-16 (×6): qty 200

## 2016-03-16 MED ORDER — LORAZEPAM 2 MG/ML IJ SOLN: 1.0000 mg | Freq: Four times a day (QID) | INTRAMUSCULAR | Status: AC | PRN

## 2016-03-16 MED ORDER — PIPERACILLIN-TAZOBACTAM 3.375 G IVPB
3.3750 g | Freq: Three times a day (TID) | INTRAVENOUS | Status: DC
  Administered 2016-03-17 – 2016-03-19 (×6): 3.375 g via INTRAVENOUS
  Filled 2016-03-16 (×9): qty 50

## 2016-03-16 MED ORDER — ACETAMINOPHEN 650 MG RE SUPP: 650.0000 mg | Freq: Four times a day (QID) | RECTAL | Status: DC | PRN

## 2016-03-16 MED ORDER — NYSTATIN 100000 UNIT/ML MT SUSP
5.0000 mL | Freq: Three times a day (TID) | OROMUCOSAL | Status: DC
  Administered 2016-03-17 – 2016-03-20 (×15): 500000 [IU] via ORAL
  Filled 2016-03-16 (×15): qty 5

## 2016-03-16 MED ORDER — SODIUM CHLORIDE 0.9 % IV SOLN
2000.0000 mg | Freq: Once | INTRAVENOUS | Status: AC
  Administered 2016-03-16: 2000 mg via INTRAVENOUS
  Filled 2016-03-16: qty 2000

## 2016-03-16 MED ORDER — VITAMIN B-1 100 MG PO TABS
100.0000 mg | ORAL_TABLET | Freq: Every day | ORAL | Status: DC
  Administered 2016-03-17 – 2016-03-20 (×4): 100 mg via ORAL
  Filled 2016-03-16 (×4): qty 1

## 2016-03-16 MED ORDER — IOPAMIDOL (ISOVUE-370) INJECTION 76%
INTRAVENOUS | Status: AC
  Administered 2016-03-16: 100 mL
  Filled 2016-03-16: qty 100

## 2016-03-16 NOTE — ED Notes (Signed)
Pt brother, Stephen [name] [phone], (h) call with updates

## 2016-03-16 NOTE — H&P (Addendum)
History and Physical  Patient Name: Stephen [name]     [id number]    DOB: Sep 29, 1961    DOA: 03/16/2016 PCP: [name], MD   Patient coming from: Home --[facility] --> EMS --> ER  Chief Complaint: Syncope, vomiting, unsteady gait  HPI: Stephen [name] is a 55 y.o. 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  The patient has been sober for he estimates 6 weeks, finished some form of outpatient rehab, and living at home over the last few months.  He had worsening dysphagia and was seen by Dr. Laural Golden at [facility] for EGD and colonoscopy last month.  EGD showed esophageal ulcers with yeast elements, although this was thought to be contaminant, and he was just treated with sucralfate and PPI.  He describes to me that his symptoms are essentially unchanged, but have worsened over the last week.  Now, in the last week, his orthostasis has worsened, he has had imbalance and feels unsteady and falls when standing repeatedly.  He has intermittent vertigo, possibly tinnitus.  In the last two days, he has had recurrent NBNB emesis of all intake, decreased UOP, worse orthostasis.  He has not had fever, chills/rigors, dyspnea, dysuria or flank pain.  Today, he went to his PCP for a routine visit and passed out in the waiting room.  He appeared pale but responsive to noxious stimuli there, initially BP not palpable, slowly came up to 70s/40s while waiting for EMS and he became more responsive.  In the ED, he was initially hypotensive to 93/73, sinus tachycardia to 127.  Na 125, K 3.5, Cr 1.44 (baseline 0.7), BUN up, WBC 15K, Hgb 11.8 (at baseline), thrombocytopenic.  Trop 0.06, BNP 168, alcohol negative.  CT head clear.  CTA negative for PE, rib fracture, or pneumonia.  He was given fluids, HR improved modestly.  TRH were asked to evaluate for  admission.   He was last admitted here last Dec for similar presentation in setting of heavy alcohol use at the time.  Since then he has gone through rehab and says he is sober for 6 weeks.  He now firmly denies recent use.  He c/o chest pain to ER, but to me relates mostly vertigo, vomiting.  He does not take fluoxetine.  He does take omeprazole, Xarelto, bisoprolol and spironolactone.     Review of Systems:  Pt complains of vomiting, tongue white spots, imbalance.  Also notes LEFT rib pain after fall, chin infection, falls, equivocal melena, congestion of nose, cough productive of yellowish sputum. All other systems negative except as just noted or noted in the history of present illness.    Past Medical History  Diagnosis Date  . Obesity   . Hypertension   . Hyperlipidemia   . Systolic heart failure   . Coronary artery disease     native vessel  . AICD (automatic cardioverter/defibrillator) present 2006    [place] 7232  . Ischemic cardiomyopathy     ischemic  . [place] Paroxysmal ventricular tachycardia ([place])     Rx via [facility] 2008  . Pulmonary embolism ([place])   . MI, old   . OSA (obstructive sleep apnea)     PCP aware, pt does not use.  . Depression   . Shortness of breath dyspnea   . GERD (  gastroesophageal reflux disease)   . Arthritis     osteoarthritis  . Alcohol abuse     Past Surgical History  Procedure Laterality Date  . Cardiac defibrillator placement  2006    [employer] Maximo 7232  . Knee surgery Left     arthroscopy  . Implantable cardioverter defibrillator generator change N/A 09/02/2014    Procedure: IMPLANTABLE CARDIOVERTER DEFIBRILLATOR GENERATOR CHANGE;  Surgeon: Deboraha Sprang, MD;  Location: [facility] CATH LAB;  Service: Cardiovascular;  Laterality: N/A;  . Lead revision N/A 09/02/2014    Procedure: LEAD REVISION;  Surgeon: Deboraha Sprang, MD;  Location: [facility] CATH LAB;  Service: Cardiovascular;  Laterality: N/A;  . Esophagogastroduodenoscopy  (egd) with propofol N/A 01/21/2016    Procedure: ESOPHAGOGASTRODUODENOSCOPY (EGD) WITH PROPOFOL;  Surgeon: Rogene Houston, MD;  Location: [facility] ENDO SUITE;  Service: Endoscopy;  Laterality: N/A;  8:30 - to be done under [facility]  . Esophageal dilation N/A 01/21/2016    Procedure: ESOPHAGEAL DILATION;  Surgeon: Rogene Houston, MD;  Location: [facility] ENDO SUITE;  Service: Endoscopy;  Laterality: N/A;  . Colonoscopy with propofol N/A 01/21/2016    Procedure: COLONOSCOPY WITH PROPOFOL;  Surgeon: Rogene Houston, MD;  Location: [facility] ENDO [facility];  Service: Endoscopy;  Laterality: N/A;  . Biopsy  01/21/2016    Procedure: BIOPSY;  Surgeon: Rogene Houston, MD;  Location: [facility] ENDO SU[facility];  Service: Endoscopy;;  Esophageal biopsies with brushings  . Polypectomy  01/21/2016    Procedure: POLYPECTOMY;  Surgeon: Rogene Houston, MD;  Location: [facility] ENDO SU[facility];  Service: Endoscopy;;  Ascending colon polyp removed via cold snare    Social History: Patient lives alone, has family who check on him and prepare food for him occasionally.  No longer able to work.  The patient walks without assistance.  He smokes.  He denies recent alcohol use.    No Known Allergies  Family history: family history includes Coronary aneurysm in his mother; Esophageal cancer in his father.    Prior to Admission medications   Medication Sig Start Date End Date Taking? Authorizing Provider  acetaminophen (TYLENOL) 325 MG tablet Take 650 mg by mouth every 6 (six) hours as needed for moderate pain.   Yes Historical Provider, MD  aspirin 81 MG EC tablet Take 1 tablet (81 mg total) by mouth every other day. 01/22/16  Yes Rogene Houston, MD  bisoprolol (ZEBETA) 5 MG tablet Take 1 tablet by mouth 2 (two) times daily. 11/19/15  Yes Historical Provider, MD  cholecalciferol (VITAMIN D) 1000 UNITS tablet Take 1,000 Units by mouth daily.    Yes Historical Provider, MD  folic acid (FOLVITE) 1 MG tablet Take 1 mg by mouth daily.  11/17/13  Yes Historical Provider, MD   nitroGLYCERIN (NITROSTAT) 0.4 MG SL tablet Place 0.4 mg under the tongue every 5 (five) minutes as needed for chest pain.   Yes Historical Provider, MD  omeprazole (PRILOSEC) 40 MG capsule Take 40 mg by mouth daily.   Yes Historical Provider, MD  potassium chloride (K-DUR) 10 MEQ tablet Take 1 tablet by mouth 2 (two) times daily. 01/18/16  Yes Historical Provider, MD  rivaroxaban (XARELTO) 20 MG TABS tablet Take 1 tablet (20 mg total) by mouth daily with supper. 01/22/16  Yes Rogene Houston, MD  spironolactone (ALDACTONE) 25 MG tablet Take 25 mg by mouth daily.   Yes Historical Provider, MD  tiotropium (SPIRIVA) 18 MCG inhalation capsule Place 18 mcg into inhaler and inhale daily as needed (shortness of  breath).    Yes Historical Provider, MD  sucralfate (CARAFATE) 1 GM/10ML suspension Take 10 mLs (1 g total) by mouth 4 (four) times daily -  with meals and at bedtime. 01/21/16   Rogene Houston, MD       Physical Exam: BP 99/88 mmHg  Pulse 114  Temp(Src) 97.3 F (36.3 C) (Oral)  Resp 18  Ht 6' (1.829 m)  Wt 81.647 kg (180 lb)  BMI 24.41 kg/m2  SpO2 100% General appearance: Frail adult male, alert and in no acute distress.  Appears tired.   Eyes: Anicteric, conjunctiva pink, lids and lashes normal.     ENT: No nasal deformity, discharge, or epistaxis.  OP moist with whitish plaques on tongue and soft palate, not scraped off with tongue blade.   Lymph: No cervical or supraclavicular lymphadenopathy. Skin: Warm and dry.  Wan color, no jaundice or stigmata of portal hypertension.  No suspicious rashes or lesions. Cardiac: Tachycardic, regular, nl S1-S2, no murmurs appreciated.  Capillary refill is sluggish but Less than 2s.  JVP not visible.  No LE edema.  Radial and DP pulses weak. Respiratory: Normal respiratory rate and rhythm.  CTAB without rales or wheezes. Abdomen: Abdomen soft without rigidity.  No HSM.  No TTP. No ascites, distension.   MSK: No deformities or effusions.  Decreased  muscle bulk diffusely, thenar wasting. Neuro: Cranial nerves normal.  Able to stand unassisted, becomes lightheaded 15 seconds after standing.  Sensorium intact and responding to questions, attention normal.  Speech is fluent.  Moves all extremities equally and with normal coordination.  FTN testing normal.  No nystagmus.   Psych: Behavior appropriate.  Affect normal.  No evidence of aural or visual hallucinations or delusions.       Labs on Admission:  I have personally reviewed following labs and imaging studies: CBC:  Recent Labs Lab 03/16/16 1822  WBC 15.2*  NEUTROABS 12.6*  HGB 11.8*  HCT 33.5*  MCV 89.8  PLT 87*   Basic Metabolic Panel:  Recent Labs Lab 03/16/16 1822  NA 125*  K 3.5  CL 81*  CO2 26  GLUCOSE 121*  BUN 22*  CREATININE 1.44*  CALCIUM 6.8*   GFR: Estimated Creatinine Clearance: 63.6 mL/min (by C-G formula based on Cr of 1.44). Liver Function Tests:  Recent Labs Lab 03/16/16 1822  AST 36  ALT 22  ALKPHOS 115  BILITOT 1.6*  PROT 5.5*  ALBUMIN 2.0*   No results for input(s): LIPASE, AMYLASE in the last 168 hours. No results for input(s): AMMONIA in the last 168 hours. Coagulation Profile: No results for input(s): INR, PROTIME in the last 168 hours. Cardiac Enzymes:  Recent Labs Lab 03/16/16 1822  TROPONINI 0.06*   BNP (last 3 results) No results for input(s): PROBNP in the last 8760 hours. HbA1C: No results for input(s): HGBA1C in the last 72 hours. CBG: No results for input(s): GLUCAP in the last 168 hours. Lipid Profile: No results for input(s): CHOL, HDL, LDLCALC, TRIG, CHOLHDL, LDLDIRECT in the last 72 hours. Thyroid Function Tests: No results for input(s): TSH, T4TOTAL, FREET4, T3FREE, THYROIDAB in the last 72 hours. Anemia Panel: No results for input(s): VITAMINB12, FOLATE, FERRITIN, TIBC, IRON, RETICCTPCT in the last 72 hours. Sepsis Labs: Lactic acid >4 @LABRCNTIP (procalcitonin:4,lacticidven:4) )No results found for this  or any previous visit (from the past 240 hour(s)).       Radiological Exams on Admission: Personally reviewed: Ct Head Wo Contrast  03/16/2016  CLINICAL DATA:  55 year old male with  fall EXAM: CT HEAD WITHOUT CONTRAST TECHNIQUE: Contiguous axial images were obtained from the base of the skull through the vertex without intravenous contrast. COMPARISON:  Head CT dated 01/18/2016 FINDINGS: There is stable mild prominence of the ventricles and sulci compatible mild age-related atrophy. Minimal periventricular and deep white matter chronic microvascular ischemic changes noted. There is no acute intracranial hemorrhage. No mass effect or midline shift The visualized paranasal sinuses and mastoid air cells are clear. The calvarium is intact. IMPRESSION: No acute intracranial pathology. Electronically Signed   By: Anner Crete M.D.   On: 03/16/2016 19:38   Ct Angio Chest Pe W/cm &/or Wo Cm  03/16/2016  CLINICAL DATA:  Fall, history of pulmonary embolism, systolic heart failure, dyspnea, patient on Xarelto, hypotension EXAM: CT ANGIOGRAPHY CHEST WITH CONTRAST TECHNIQUE: Multidetector CT imaging of the chest was performed using the standard protocol during bolus administration of intravenous contrast. Multiplanar CT image reconstructions and MIPs were obtained to evaluate the vascular anatomy. CONTRAST:  100 mL Isovue 370 COMPARISON:  01/18/2016 FINDINGS: No infiltrate or consolidation. No evidence of pulmonary edema. No pleural effusion. Thoracic aorta shows no dissection or dilatation. Patulous fluid-filled esophagus with mild wall thickening. No pericardial effusion. Severe fatty infiltration of the liver. No defects in the pulmonary arterial system. No acute musculoskeletal findings. Two lead cardiac pacer noted. Review of the MIP images confirms the above findings. IMPRESSION: No evidence pulmonary embolism Lungs clear Severe hepatic steatosis Patulous fluid-filled esophagus suggesting severe  gastroesophageal reflux. Wall thickening noted. Cannot exclude esophagitis. Electronically Signed   By: Skipper Cliche M.D.   On: 03/16/2016 20:18    EKG: Independently reviewed. Rate 122, sinus rhythm.  QTc 622, no ischemic changes.   ICD interrogation: Report from [employer] shows no events on day of admision (only 3 non-sustained VT episodes scattered in April since last interrogation).    Assessment/Plan 1. Hypotension and syncope:  This is either simple dehydration or sepsis wtihout source.    Suspected source unclear.  Organism unknown, no history of DR organisms. Patient meets criteria given tachycardia, tachypnea, leukocytosis, and evidence of organ dysfunction.  Lactate >4 mmol/L and repeat ordered within 6 hours.  This patient is at high risk of poor outcomes with a SOFA score of 2 (at least 2 of the following clinical criteria: respiratory rate of 22/min or greater, altered mentation, or systolic blood pressure of 100 mm Hg or less).  Antibiotics delivered in the ED.    -Sepsis bundle utilized:  -Blood and urine cultures drawn  -30 ml/kg bolus given in ED, will repeat lactic acid  -Start targeted antibiotics with vancomycin and Zosyn, based on suspected source of infection    -Repeat renal function and complete blood count in AM  -Code SEPSIS called to E-link -Check UDS     2. Hyponatremia:  Hypovolemic hyponatremia -Fluids and trend BMP  3. AKI vs dehydration:  -Check UA and FeNA -Fuids and trend BMP  4. Hypocalcemia:  -Check ionized Ca -Check magnesium  5. Anemia of chronic disease:  Stable -Check FOBT given ?history melena  6. Elevated troponin:  Doubt ACS, suspect demand in setting of hypovolemia and CHF. -Trend enzymes  7. Thrombocytopenia:  Present previously.  Unclear etiology. -Monitor CBC  8. Isch CM with systolic CHF:  EF last fall 40-45%.   -Strict I/Os, daily weights -Daily BMP -Fluids as above, but monitor volume status closely  9. Hx  of SVT and pAF:  CHADS2Vasc 3.  On anticoagulant and bisoprolol.  10. Hx  of PE: CTA negative for PE. -Continue Xarelto -Trend platelets  11. Alcohol abuse: States he is sober.  EtOH negative.  Will defer CIWA for now.  12. HTN: -Hold spironolactone and bisoprolol given hypotension  13. Oral thrush: -Nystatin swish and spit -Probiotics  14. Prolonged QTc: Avoid ondansetron. -Phenergan or lorazepam for nausea      DVT prophylaxis: On rivaroxaban, low platelets  Code Status: FULL  Family Communication: None present  Disposition Plan: Anticipate treatment with IV antibiotics and IV fluids and close monitoring of lactic acid and following culture data.  Given hx of CHF with EF 40%, will need close monitoring of fluid status as well to prevent fluid overload. Consults called: None Admission status: INPATIENT, stepdown   Medical decision making: Patient seen at 10:00 PM on 03/16/2016.  The patient was discussed with Dr. Henry Russel. Clinical condition: unstable, given hypotension, elevated lactic acid, orthostasis, potential infection.  All care Everywhere records were reviewed.      Edwin Dada [facility] Pager [phone]

## 2016-03-16 NOTE — ED Notes (Signed)
Dr. Loleta Books at the bedside.

## 2016-03-16 NOTE — ED Notes (Signed)
Reported lactic acid of 4.25 to Dr. Loleta Books, MD acknowleges, prepares code sepsis order set. Bed type changed from tele to step down. Ellene Route, receiving RN on [phone].

## 2016-03-16 NOTE — ED Notes (Signed)
Received call from [employer], increased frequency of pvc's and increased heart rate noted. Awaiting fax report.

## 2016-03-16 NOTE — ED Notes (Signed)
Dr. Danford, hospitalist, at the bedside.  

## 2016-03-16 NOTE — ED Provider Notes (Signed)
CSN: [id number]     Arrival date & time 03/16/16  1639 History   First MD Initiated Contact with Patient 03/16/16 1640     Chief Complaint  Patient presents with  . Loss of Consciousness    HPI Stephen [name] is 55 y.o. male with past mental history of CAD, ischemic cardiopathy status post ICD placement, history of PE on Xarelto with multiple missed doses who presents to ED for evaluation of syncope at PCP office. Patient had multiple episodes of syncope throughout the past 1 week in association with nasal congestion, cough and sputum. He had walked into his doctor's office and felt prodromal with a sensation of the room closing in on him and then passed out for a brief period of time. This was witnessed. There was no seizure-like activity nor postevent confusion. On EMS arrival, he was hypotensive blood pressure 70/40 and this improved with 1 L normal saline bolus. Fingerstick blood glucose was within normal limits per EMS. He has struck his head a few times, no HA or vision change now. He denies recent alcohol use. Denies fever, chills, abdominal pain, nausea, vomiting, diarrhea, bloody stools and polyuria. He does endorse decreased urinary output. Does endorse new clear sputum, no hemoptysis. No lower extremity edema or pain.   Past Medical History  Diagnosis Date  . Obesity   . Hypertension   . Hyperlipidemia   . Systolic heart failure   . Coronary artery disease     native vessel  . AICD (automatic cardioverter/defibrillator) present 2006    [place] 7232  . Ischemic cardiomyopathy     ischemic  . [place] Paroxysmal ventricular tachycardia ([place])     Rx via [facility] 2008  . Pulmonary embolism ([place])   . MI, old   . OSA (obstructive sleep apnea)     PCP aware, pt does not use.  . Depression   . Shortness of breath dyspnea   . GERD (gastroesophageal reflux disease)   . Arthritis     osteoarthritis  . Alcohol abuse   . Chronic systolic CHF (congestive heart failure) ([facility])  03/16/2016   Past Surgical History  Procedure Laterality Date  . Cardiac defibrillator placement  2006    [employer] Maximo 7232  . Knee surgery Left     arthroscopy  . Implantable cardioverter defibrillator generator change N/A 09/02/2014    Procedure: IMPLANTABLE CARDIOVERTER DEFIBRILLATOR GENERATOR CHANGE;  Surgeon: Deboraha Sprang, MD;  Location: [facility] CATH LAB;  Service: Cardiovascular;  Laterality: N/A;  . Lead revision N/A 09/02/2014    Procedure: LEAD REVISION;  Surgeon: Deboraha Sprang, MD;  Location: [facility] CATH LAB;  Service: Cardiovascular;  Laterality: N/A;  . Esophagogastroduodenoscopy (egd) with propofol N/A 01/21/2016    Procedure: ESOPHAGOGASTRODUODENOSCOPY (EGD) WITH PROPOFOL;  Surgeon: Rogene Houston, MD;  [facility];  Service: Endoscopy;  Laterality: N/A;  8:30 - to be done under [facility]  . Esophageal dilation N/A 01/21/2016    Procedure: ESOPHAGEAL DILATION;  Surgeon: Rogene Houston, MD;  Location: [facility];  Service: Endoscopy;  Laterality: N/A;  . Colonoscopy with propofol N/A 01/21/2016    Procedure: COLONOSCOPY WITH PROPOFOL;  Surgeon: Rogene Houston, MD;  [facility];  Service: Endoscopy;  Laterality: N/A;  . Biopsy  01/21/2016    Procedure: BIOPSY;  Surgeon: Rogene Houston, MD;  [facility];  Service: Endoscopy;;  Esophageal biopsies with brushings  . Polypectomy  01/21/2016  Procedure: POLYPECTOMY;  Surgeon: Rogene Houston, MD;  [facility]: [facility];  Service: Endoscopy;;  Ascending colon polyp removed via cold snare   Family History  Problem Relation Age of Onset  . Coronary aneurysm Mother   . Esophageal cancer Father    Social History  Substance Use Topics  . Smoking status: Current Every Day Smoker -- 0.50 packs/day for 30 years    Types: Cigarettes  . Smokeless tobacco: Never Used     Comment: 3-4 cigarettes a day.  . Alcohol Use: 5.4 oz/week    9 Shots of liquor, 0 Standard drinks or equivalent per week      Comment: 1/2 pint rarely per pt - past hx of heavy alcoholism per pt, just stopped Feb 3//2017    Review of Systems  Constitutional: Negative for fever, chills and diaphoresis.  HENT: Positive for congestion (nasal), postnasal drip and rhinorrhea. Negative for sore throat, trouble swallowing and voice change.   Respiratory: Negative for cough, shortness of breath and wheezing.   Cardiovascular: Positive for chest pain. Negative for palpitations and leg swelling.  Gastrointestinal: Negative for nausea, vomiting, abdominal pain and blood in stool.  Endocrine: Negative for polyuria.  Genitourinary: Positive for decreased urine volume. Negative for dysuria and frequency.  Musculoskeletal: Negative for myalgias, back pain and neck pain.  Skin: Negative for rash.  Neurological: Positive for syncope. Negative for dizziness, seizures, facial asymmetry, speech difficulty, weakness, numbness and headaches.  Psychiatric/Behavioral: Negative for confusion.  All other systems reviewed and are negative.     Allergies  Review of patient's allergies indicates no known allergies.  Home Medications   Prior to Admission medications   Medication Sig Start Date End Date Taking? Authorizing Provider  acetaminophen (TYLENOL) 325 MG tablet Take 650 mg by mouth every 6 (six) hours as needed for moderate pain.   Yes Historical Provider, MD  aspirin 81 MG EC tablet Take 1 tablet (81 mg total) by mouth every other day. 01/22/16  Yes Rogene Houston, MD  bisoprolol (ZEBETA) 5 MG tablet Take 1 tablet by mouth 2 (two) times daily. 11/19/15  Yes Historical Provider, MD  cholecalciferol (VITAMIN D) 1000 UNITS tablet Take 1,000 Units by mouth daily.    Yes Historical Provider, MD  folic acid (FOLVITE) 1 MG tablet Take 1 mg by mouth daily.  11/17/13  Yes Historical Provider, MD  nitroGLYCERIN (NITROSTAT) 0.4 MG SL tablet Place 0.4 mg under the tongue every 5 (five) minutes as needed for chest pain.   Yes Historical  Provider, MD  omeprazole (PRILOSEC) 40 MG capsule Take 40 mg by mouth daily.   Yes Historical Provider, MD  potassium chloride (K-DUR) 10 MEQ tablet Take 1 tablet by mouth 2 (two) times daily. 01/18/16  Yes Historical Provider, MD  rivaroxaban (XARELTO) 20 MG TABS tablet Take 1 tablet (20 mg total) by mouth daily with supper. 01/22/16  Yes Rogene Houston, MD  spironolactone (ALDACTONE) 25 MG tablet Take 25 mg by mouth daily.   Yes Historical Provider, MD  tiotropium (SPIRIVA) 18 MCG inhalation capsule Place 18 mcg into inhaler and inhale daily as needed (shortness of breath).    Yes Historical Provider, MD  sucralfate (CARAFATE) 1 GM/10ML suspension Take 10 mLs (1 g total) by mouth 4 (four) times daily -  with meals and at bedtime. 01/21/16   Rogene Houston, MD   BP 114/59 mmHg  Pulse 107  Temp(Src) 97.3 F (36.3 C) (Oral)  Resp 11  Ht 6' (1.829  m)  Wt 81.647 kg  BMI 24.41 kg/m2  SpO2 100% Physical Exam  Constitutional: He is oriented to person, place, and time. He appears well-developed and well-nourished. No distress.  Coughing up clear sputum  HENT:  Head: Normocephalic.  Nose: Nose normal.  White thrush scrapes off pharynx. Healing abrasions to face and forehead  Eyes: Conjunctivae are normal.  conj pallor  Neck: Normal range of motion. Neck supple. No tracheal deviation present.  No bruits  Cardiovascular: Regular rhythm and normal heart sounds.   No murmur (none i can appreciate) heard. tachy  Pulmonary/Chest: Effort normal and breath sounds normal. No respiratory distress. He has no wheezes. He has no rales. He exhibits tenderness (left chest wall).  Abdominal: Soft. Bowel sounds are normal. He exhibits no distension and no mass. There is no tenderness.  Neg cvat. No mass  Genitourinary:  Rectal exam: no hemorrhoids.  Stool light [place], no melena.   Musculoskeletal: Normal range of motion. He exhibits no edema or tenderness.  No lower extremity edema, calf tenderness,  warmth, erythema or palpable cords   Neurological: He is alert and oriented to person, place, and time.  No motor deficit, normal sensation. CN 3-12 normal  Skin: Skin is warm and dry. No rash noted. There is pallor.  No petechia or ecchymoses  Psychiatric: He has a normal mood and affect.  Nursing note and vitals reviewed.   ED Course  Procedures (including critical care time) Labs Review Labs Reviewed  CBC WITH DIFFERENTIAL/PLATELET - Abnormal; Notable for the following:    WBC 15.2 (*)    RBC 3.73 (*)    Hemoglobin 11.8 (*)    HCT 33.5 (*)    RDW 15.9 (*)    Platelets 87 (*)    Neutro Abs 12.6 (*)    All other components within normal limits  COMPREHENSIVE METABOLIC PANEL - Abnormal; Notable for the following:    Sodium 125 (*)    Chloride 81 (*)    Glucose, Bld 121 (*)    BUN 22 (*)    Creatinine, Ser 1.44 (*)    Calcium 6.8 (*)    Total Protein 5.5 (*)    Albumin 2.0 (*)    Total Bilirubin 1.6 (*)    GFR calc non Af Amer 53 (*)    Anion gap 18 (*)    All other components within normal limits  TROPONIN I - Abnormal; Notable for the following:    Troponin I 0.06 (*)    All other components within normal limits  BRAIN NATRIURETIC PEPTIDE - Abnormal; Notable for the following:    B Natriuretic Peptide 168.1 (*)    All other components within normal limits  I-STAT CG4 LACTIC ACID, ED - Abnormal; Notable for the following:    Lactic Acid, Venous 4.25 (*)    All other components within normal limits  CULTURE, BLOOD (ROUTINE X 2)  CULTURE, BLOOD (ROUTINE X 2)  URINE CULTURE  APTT  ETHANOL  TROPONIN I  URINE RAPID DRUG SCREEN, HOSP PERFORMED  CALCIUM, IONIZED  URINALYSIS, ROUTINE W REFLEX MICROSCOPIC (NOT AT [facility])  MAGNESIUM  LACTIC ACID, PLASMA  LACTIC ACID, PLASMA  BASIC METABOLIC PANEL  CBC  PROTIME-INR  OCCULT BLOOD X 1 CARD TO LAB, STOOL  SODIUM, URINE, RANDOM  CREATININE, URINE, RANDOM  POC OCCULT BLOOD, ED    Imaging Review Ct Head Wo  Contrast  03/16/2016  CLINICAL DATA:  55 year old male with fall EXAM: CT HEAD WITHOUT CONTRAST TECHNIQUE: Contiguous axial  images were obtained from the base of the skull through the vertex without intravenous contrast. COMPARISON:  Head CT dated 01/18/2016 FINDINGS: There is stable mild prominence of the ventricles and sulci compatible mild age-related atrophy. Minimal periventricular and deep white matter chronic microvascular ischemic changes noted. There is no acute intracranial hemorrhage. No mass effect or midline shift The visualized paranasal sinuses and mastoid air cells are clear. The calvarium is intact. IMPRESSION: No acute intracranial pathology. Electronically Signed   By: Anner Crete M.D.   On: 03/16/2016 19:38   Ct Angio Chest Pe W/cm &/or Wo Cm  03/16/2016  CLINICAL DATA:  Fall, history of pulmonary embolism, systolic heart failure, dyspnea, patient on Xarelto, hypotension EXAM: CT ANGIOGRAPHY CHEST WITH CONTRAST TECHNIQUE: Multidetector CT imaging of the chest was performed using the standard protocol during bolus administration of intravenous contrast. Multiplanar CT image reconstructions and MIPs were obtained to evaluate the vascular anatomy. CONTRAST:  100 mL Isovue 370 COMPARISON:  01/18/2016 FINDINGS: No infiltrate or consolidation. No evidence of pulmonary edema. No pleural effusion. Thoracic aorta shows no dissection or dilatation. Patulous fluid-filled esophagus with mild wall thickening. No pericardial effusion. Severe fatty infiltration of the liver. No defects in the pulmonary arterial system. No acute musculoskeletal findings. Two lead cardiac pacer noted. Review of the MIP images confirms the above findings. IMPRESSION: No evidence pulmonary embolism Lungs clear Severe hepatic steatosis Patulous fluid-filled esophagus suggesting severe gastroesophageal reflux. Wall thickening noted. Cannot exclude esophagitis. Electronically Signed   By: Skipper Cliche M.D.   On: 03/16/2016  20:18   I have personally reviewed and evaluated these images and lab results as part of my medical decision-making.   EKG Interpretation   Date/Time:  Thursday Mar 16 2016 16:48:09 EDT Ventricular Rate:  122 PR Interval:  88 QRS Duration: 99 QT Interval:  435 QTC Calculation: 620 R Axis:   28 Text Interpretation:  Sinus tachycardia Ventricular premature complex  Aberrant conduction of SV complex(es) Inferior infarct, old Probable  lateral infarct, age indeterminate Prolonged QT interval SINCE LAST  TRACING HEART RATE HAS INCREASED Confirmed by Winfred Leeds  MD, SAM [phone])  on 03/16/2016 5:02:18 PM      MDM   Final diagnoses:  Sinus tachycardia ([place])  Syncope, unspecified syncope type  Anticoagulated  AKI (acute kidney injury) ([place])  Hyponatremia    Accompanying cardiac history February in place, CAD, history of PE noncompliant with Xarelto also intermittently presents to ED for motion of syncope and hypotension since resolved after EMS intervention 1 L normal saline. He remains tachycardic and has left-sided chest pain which will need a CT PE study. Given falls and bruises to the face we'll obtain head. Pallor noted, no hematemesis.  Last endoscopy without varices; +esophageal ulcers 2 months ago. Denies melena will check hemoglobin.  Rectal exam wo melena. He does have a cough and sputum afebrile here will check lung parenchyma for infectious source. Reviewed last TTE which was without aortic stenosis, EF 45%. He appears euvolemic on exam.EKG wo new changes compared to prior. Sinus tachy. No acute right heart strain pattern or STE on review. Will interrogate pacemaker.    Review of labs: AKI with hyponatremia cw recent episodes of vomiting./ Abd remians benign. No episodes of syncope or vomiting here, tolerating PO. CTA wo PE or PNA.  CT head neg for hemorrhage.  Given some fluids here, will re-assess.  Spoke with hospitalist Dr Loleta Books who will admit pt, pending pacemaker  interrogation. Abx ordered with sepsis bundle, source unclear,  could have GI source given NVD. No abd ttp.Fluid resuscitation needs to be titrated given hx of CHF. Meets SIRS criteria but dehydration could have similar presentation. Appears hypovolemic on exam.     11:13 PM awaiting on interrogation report.     Tammy Sours, MD 03/16/16 [id number]  Orlie Dakin, MD 03/17/16 [phone]

## 2016-03-16 NOTE — ED Notes (Signed)
Patient allowed to eat per MD.

## 2016-03-16 NOTE — ED Notes (Signed)
Phlebotomy at the bedside  

## 2016-03-16 NOTE — ED Notes (Signed)
REMS from PCP, multiple recent falls with syncope, at PCP for same with witnessed syncope, takes blood thinners, hypotensive on EMS arrival, better after fluids, A/O X4 on arrival and in NAD, ST  4mg  zofran

## 2016-03-16 NOTE — ED Provider Notes (Signed)
Patient with multiple episodes of syncope this past week. He saw his primary care physician Dr. Edrick Oh earlier today and had syncopal event. Office. Sent here for further evaluation. Patient is chronically ill-appearing. Alert Glasgow Coma Score 15. HEENT exam no facial asymmetry neck supple trachea midline lungs clear auscultation heart tachycardic regular rhythm abdomen nondistended nontender extremities without edema. Neurologic Glasgow Coma Score 15 cranial nerves II through XII intact moves all extremities well  Orlie Dakin, MD 03/16/16 2315

## 2016-03-16 NOTE — Progress Notes (Signed)
Attempted to get report. 

## 2016-03-16 NOTE — ED Notes (Signed)
Called bed placement, no stepdown bed available. Bed type changed from tele to step down

## 2016-03-16 NOTE — Progress Notes (Signed)
Pharmacy Antibiotic Note  Stephen [name] is a 55 y.o. male admitted on 03/16/2016 with worsening orthostasis and passed out at his PCP's office. Pharmacy has been consulted for Zosyn and Vancomycin dosing for sepsis.  WBC 15.2, LA 4.25, SCr 1.44 (BL ~ 0.69), CrCl ~ 60-65 mL/min   Plan: -Vancomycin 2 gm IV load followed by 1 gm IV Q 12 hours  -Zosyn 3.375 gm IV Q 8 hours  -Monitor CBC, renal fx, cultures and clinical progress -[facility] at [facility]   Height: 6' (182.9 cm) Weight: 180 lb (81.647 kg) IBW/kg (Calculated) : 77.6  Temp (24hrs), Avg:97.3 F (36.3 C), Min:97.3 F (36.3 C), Max:97.3 F (36.3 C)   Recent Labs Lab 03/16/16 1822 03/16/16 2205  WBC 15.2*  --   CREATININE 1.44*  --   LATICACIDVEN  --  4.25*    Estimated Creatinine Clearance: 63.6 mL/min (by C-G formula based on Cr of 1.44).    No Known Allergies  Antimicrobials this admission: Vanc 5/11>> Zosyn 5/11>>  Dose adjustments this admission: None   Microbiology results: 5/11 BCx2>> 5/11 UCx>>  Thank you for allowing pharmacy to be a part of this patient's care.  Albertina Parr, PharmD., BCPS Clinical Pharmacist Pager [phone]

## 2016-03-16 NOTE — ED Notes (Signed)
Received call from CT, patient next for transport.

## 2016-03-16 NOTE — ED Notes (Signed)
Explained no eating until radiology results reviewed by MD

## 2016-03-17 ENCOUNTER — Inpatient Hospital Stay (HOSPITAL_COMMUNITY): Payer: Medicare Other

## 2016-03-17 ENCOUNTER — Encounter (HOSPITAL_COMMUNITY): Payer: Self-pay | Admitting: General Practice

## 2016-03-17 DIAGNOSIS — I9589 Other hypotension: Secondary | ICD-10-CM

## 2016-03-17 DIAGNOSIS — N179 Acute kidney failure, unspecified: Secondary | ICD-10-CM | POA: Insufficient documentation

## 2016-03-17 DIAGNOSIS — E871 Hypo-osmolality and hyponatremia: Secondary | ICD-10-CM | POA: Diagnosis present

## 2016-03-17 DIAGNOSIS — I509 Heart failure, unspecified: Secondary | ICD-10-CM

## 2016-03-17 LAB — COMPREHENSIVE METABOLIC PANEL
ALBUMIN: 1.9 g/dL — AB (ref 3.5–5.0)
ALK PHOS: 108 U/L (ref 38–126)
ALT: 22 U/L (ref 17–63)
ANION GAP: 17 — AB (ref 5–15)
AST: 36 U/L (ref 15–41)
BILIRUBIN TOTAL: 1.8 mg/dL — AB (ref 0.3–1.2)
BUN: 20 mg/dL (ref 6–20)
CALCIUM: 7 mg/dL — AB (ref 8.9–10.3)
CO2: 21 mmol/L — AB (ref 22–32)
Chloride: 88 mmol/L — ABNORMAL LOW (ref 101–111)
Creatinine, Ser: 1.26 mg/dL — ABNORMAL HIGH (ref 0.61–1.24)
GFR calc Af Amer: >60 mL/min (ref >60)
GFR calc non Af Amer: >60 mL/min (ref >60)
GLUCOSE: 95 mg/dL (ref 65–99)
Potassium: 2.5 mmol/L — CL (ref 3.5–5.1)
SODIUM: 126 mmol/L — AB (ref 135–145)
TOTAL PROTEIN: 5.2 g/dL — AB (ref 6.5–8.1)

## 2016-03-17 LAB — URINE MICROSCOPIC-ADD ON: RBC / HPF: NONE SEEN RBC/hpf (ref 0–5)

## 2016-03-17 LAB — CBC
HEMATOCRIT: 27 % — AB (ref 39.0–52.0)
Hemoglobin: 9.6 g/dL — ABNORMAL LOW (ref 13.0–17.0)
MCH: 32.8 pg (ref 26.0–34.0)
MCHC: 35.6 g/dL (ref 30.0–36.0)
MCV: 92.2 fL (ref 78.0–100.0)
PLATELETS: 69 10*3/uL — AB (ref 150–400)
RBC: 2.93 MIL/uL — ABNORMAL LOW (ref 4.22–5.81)
RDW: 16.2 % — AB (ref 11.5–15.5)
WBC: 10.9 10*3/uL — AB (ref 4.0–10.5)

## 2016-03-17 LAB — BASIC METABOLIC PANEL
ANION GAP: 14 (ref 5–15)
BUN: 22 mg/dL — ABNORMAL HIGH (ref 6–20)
CALCIUM: 5.9 mg/dL — AB (ref 8.9–10.3)
CO2: 24 mmol/L (ref 22–32)
Chloride: 86 mmol/L — ABNORMAL LOW (ref 101–111)
Creatinine, Ser: 1.16 mg/dL (ref 0.61–1.24)
GLUCOSE: 99 mg/dL (ref 65–99)
POTASSIUM: 2.1 mmol/L — AB (ref 3.5–5.1)
Sodium: 124 mmol/L — ABNORMAL LOW (ref 135–145)

## 2016-03-17 LAB — MAGNESIUM
Magnesium: 0.7 mg/dL — CL (ref 1.7–2.4)
Magnesium: 1.3 mg/dL — ABNORMAL LOW (ref 1.7–2.4)

## 2016-03-17 LAB — CREATININE, URINE, RANDOM: Creatinine, Urine: 148.23 mg/dL

## 2016-03-17 LAB — RAPID URINE DRUG SCREEN, HOSP PERFORMED
Amphetamines: NOT DETECTED
BENZODIAZEPINES: NOT DETECTED
Barbiturates: NOT DETECTED
Cocaine: NOT DETECTED
Opiates: NOT DETECTED
Tetrahydrocannabinol: POSITIVE — AB

## 2016-03-17 LAB — URINALYSIS, ROUTINE W REFLEX MICROSCOPIC
GLUCOSE, UA: NEGATIVE mg/dL
HGB URINE DIPSTICK: NEGATIVE
Ketones, ur: 15 mg/dL — AB
Nitrite: POSITIVE — AB
PH: 6.5 (ref 5.0–8.0)
Protein, ur: NEGATIVE mg/dL

## 2016-03-17 LAB — ECHOCARDIOGRAM COMPLETE
HEIGHTINCHES: 72 in
WEIGHTICAEL: 2880 [oz_av]

## 2016-03-17 LAB — PROTIME-INR
INR: 1.32 (ref 0.00–1.49)
PROTHROMBIN TIME: 16.5 s — AB (ref 11.6–15.2)

## 2016-03-17 LAB — CORTISOL: Cortisol, Plasma: 19.7 ug/dL

## 2016-03-17 LAB — TROPONIN I
TROPONIN I: 0.03 ng/mL (ref <0.031)
TROPONIN I: 0.04 ng/mL — AB (ref <0.031)
Troponin I: 0.05 ng/mL — ABNORMAL HIGH (ref <0.031)
Troponin I: <0.03 ng/mL (ref <0.031)

## 2016-03-17 LAB — LACTIC ACID, PLASMA
LACTIC ACID, VENOUS: 2.3 mmol/L — AB (ref 0.5–2.0)
LACTIC ACID, VENOUS: 1.8 mmol/L (ref 0.5–2.0)

## 2016-03-17 LAB — TSH: TSH: 1.918 u[IU]/mL (ref 0.350–4.500)

## 2016-03-17 LAB — SODIUM, URINE, RANDOM

## 2016-03-17 MED ORDER — SODIUM CHLORIDE 0.9 % IV BOLUS (SEPSIS)
500.0000 mL | Freq: Once | INTRAVENOUS | Status: AC
  Administered 2016-03-17: 500 mL via INTRAVENOUS

## 2016-03-17 MED ORDER — POTASSIUM CHLORIDE CRYS ER 20 MEQ PO TBCR
40.0000 meq | EXTENDED_RELEASE_TABLET | Freq: Two times a day (BID) | ORAL | Status: DC
  Administered 2016-03-17 – 2016-03-18 (×3): 40 meq via ORAL
  Filled 2016-03-17 (×3): qty 2

## 2016-03-17 MED ORDER — MAGNESIUM SULFATE 2 GM/50ML IV SOLN
2.0000 g | Freq: Once | INTRAVENOUS | Status: AC
  Administered 2016-03-17: 2 g via INTRAVENOUS
  Filled 2016-03-17: qty 50

## 2016-03-17 MED ORDER — SODIUM CHLORIDE 0.9 % IV SOLN
2.0000 g | Freq: Once | INTRAVENOUS | Status: AC
  Administered 2016-03-17: 2 g via INTRAVENOUS
  Filled 2016-03-17: qty 20

## 2016-03-17 MED ORDER — MAGNESIUM SULFATE 2 GM/50ML IV SOLN
2.0000 g | Freq: Once | INTRAVENOUS | Status: AC
  Administered 2016-03-17: 2 g via INTRAVENOUS
  Filled 2016-03-17 (×2): qty 50

## 2016-03-17 MED ORDER — SODIUM CHLORIDE 0.9 % IV BOLUS (SEPSIS)
250.0000 mL | Freq: Once | INTRAVENOUS | Status: AC
  Administered 2016-03-17: 250 mL via INTRAVENOUS

## 2016-03-17 MED ORDER — POTASSIUM CHLORIDE 2 MEQ/ML IV SOLN
INTRAVENOUS | Status: DC
  Administered 2016-03-17 – 2016-03-18 (×2): via INTRAVENOUS
  Filled 2016-03-17 (×4): qty 1000

## 2016-03-17 MED ORDER — POTASSIUM CHLORIDE IN NACL 40-0.9 MEQ/L-% IV SOLN
INTRAVENOUS | Status: DC
  Administered 2016-03-17: 100 mL/h via INTRAVENOUS
  Filled 2016-03-17 (×2): qty 1000

## 2016-03-17 NOTE — ED Notes (Signed)
Reported mag level of 0.7 to Dr. Loleta Books, and lactic decreaded to 2.3. MD acknoweldges, awaiting orders.

## 2016-03-17 NOTE — Progress Notes (Signed)
Patient has been oriented to the room. CCMD has been called and verified. Call light and urinal is within reach.

## 2016-03-17 NOTE — ED Notes (Addendum)
Pt is eating breakfast, requesting more food-- dietary called -- ordered blueberry muffin and fruit.  Pt states that he has not had a drink in 2 weeks-- stressed to pt to be honest with ETOH intake, pt continues to deny etoh use recently.

## 2016-03-17 NOTE — ED Notes (Signed)
IV team at the bedside. 

## 2016-03-17 NOTE — ED Notes (Addendum)
Brother given update. Brother given code [id number] to receive updates -- ok per pt.

## 2016-03-17 NOTE — Progress Notes (Signed)
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 Recent diagnosis of esophageal ulcers on EGD. On Carafate and PPI. Patient presented with worsening imbalance and falls. Found to be hypotensive, tachycardic, hyponatremic hypokalemic and hypomagnesemic.   Assessment and plan  1. Hypotension and syncope:  This is either simple dehydration or sepsis wtihout source.  Suspected source unclear. Organism unknown, no history of DR organisms. Patient meets criteria given tachycardia, tachypnea, leukocytosis, and evidence of organ dysfunction. Lactate >4 mmol/L and repeat down to 1.8. This patient is at high risk of poor outcomes with a SOFA score of 2 (at least 2 of the following clinical criteria: respiratory rate of 22/min or greater, altered mentation, or systolic blood pressure of 100 mm Hg or less). Antibiotics delivered in the ED.   -Sepsis bundle utilized: -Blood and urine cultures drawn -30 ml/kg bolus given in ED, will repeat lactic acid Continue vancomycin and Zosyn, and follow blood cultures closely UDS positive for marijuana, EtOH level negative   2. Hyponatremia:  Hypovolemic hyponatremia -Follow serial BMP   3. AKI vs dehydration:   -Check UA and FeNA -Fuids and trend BMP  4. Hypocalcemia: Likely secondary to hypomagnesemia Repleted both and recheck  5. Anemia of chronic disease:  Hemoglobin 11.8> 9.6, baseline was 14.5 on 12/16/15, check FOBT, recent EGD showed superficial esophageal ulcers, continue PPI, recent colonoscopy was negative Transfuse for hemoglobin less than 8  6. Elevated troponin: Prior troponins have been mildly abnormal, continue to cycle cardiac enzymes Doubt ACS, suspect demand in setting of hypovolemia and CHF. 2-D echo ordered, most recent 2-D echo 10/27/15 showed EF of 40-45%  7. Thrombocytopenia: Suspect secondary to alcohol use and cirrhosis Baseline around 80 -Monitor CBC  8. Isch CM with systolic CHF:  EF last fall 40-45%.  -Strict I/Os, daily weights -Daily BMP -Fluids as above, but monitor volume status closely  9. Hx of SVT and pAF:  CHADS2Vasc 3. On anticoagulant and bisoprolol.  10. Hx of PE: CTA negative for PE. -Continue Xarelto -Trend platelets  11. Alcohol abuse: States he is sober. EtOH negative. Will defer CIWA for now.  12. HTN: -Hold spironolactone and bisoprolol given hypotension  13. Oral thrush: -Nystatin swish and spit -Probiotics  14. Prolonged QTc: Avoid ondansetron. -Phenergan or lorazepam for nausea     DVT prophylaxsis Xarelto  Code Status:  Full code    Family Communication: Discussed in detail with the patient, all imaging results, lab results explained to the patient   Disposition Plan: Anticipate discharge in 3-4 days      Consultants:  None  Procedures:  None  Antibiotics: Vancomycin -5/11 Zosyn-5/11      HPI/Subjective: Extremely nauseous, trying to eat and throw up at the same time  Objective: Filed Vitals:   03/17/16 [id number] 03/17/16 0615 03/17/16 0630 03/17/16 [id number]  BP: 107/76 112/87 100/73 108/84  Pulse: 99 99 103 109  Temp:      TempSrc:      Resp:    16  Height:      Weight:      SpO2:   97% 93% 100%    Intake/Output Summary (Last 24 hours) at 03/17/16 0829 Last data filed at 03/17/16 0655  Gross per 24 hour  Intake   2500 ml  Output      0 ml  Net   2500 ml    Exam:  Examination:  General exam: Appears calm and comfortable  Respiratory system: Clear to auscultation. Respiratory effort normal. Cardiovascular system: S1 & S2 heard, RRR. No JVD, murmurs, rubs, gallops or clicks. No pedal edema. Gastrointestinal system: Abdomen is nondistended, soft and nontender. No organomegaly or masses felt. Normal bowel sounds heard. Central nervous system: Alert and oriented. No focal neurological deficits. Extremities: Symmetric 5 x 5 power. Skin: No rashes, lesions or ulcers Psychiatry: Judgement and insight appear normal. Mood & affect appropriate.     Data Reviewed: I have personally reviewed following labs and imaging studies  Micro Results No results found for this or any previous visit (from the past 240 hour(s)).  Radiology Reports Ct Head Wo Contrast  03/16/2016  CLINICAL DATA:  55 year old male with fall EXAM: CT HEAD WITHOUT CONTRAST TECHNIQUE: Contiguous axial images were obtained from the base of the skull through the vertex without intravenous contrast. COMPARISON:  Head CT dated 01/18/2016 FINDINGS: There is stable mild prominence of the ventricles and sulci compatible mild age-related atrophy. Minimal periventricular and deep white matter chronic microvascular ischemic changes noted. There is no acute intracranial hemorrhage. No mass effect or midline shift The visualized paranasal sinuses and mastoid air cells are clear. The calvarium is intact. IMPRESSION: No acute intracranial pathology. Electronically Signed   By: Anner Crete M.D.   On: 03/16/2016 19:38   Ct Angio Chest Pe W/cm &/or Wo Cm  03/16/2016  CLINICAL DATA:  Fall, history of pulmonary embolism, systolic heart failure, dyspnea, patient on Xarelto, hypotension EXAM: CT ANGIOGRAPHY CHEST WITH  CONTRAST TECHNIQUE: Multidetector CT imaging of the chest was performed using the standard protocol during bolus administration of intravenous contrast. Multiplanar CT image reconstructions and MIPs were obtained to evaluate the vascular anatomy. CONTRAST:  100 mL Isovue 370 COMPARISON:  01/18/2016 FINDINGS: No infiltrate or consolidation. No evidence of pulmonary edema. No pleural effusion. Thoracic aorta shows no dissection or dilatation. Patulous fluid-filled esophagus with mild wall thickening. No pericardial effusion. Severe fatty infiltration of the liver. No defects in the pulmonary arterial system. No acute musculoskeletal findings. Two lead cardiac pacer noted. Review of the MIP images confirms the above findings. IMPRESSION: No evidence pulmonary embolism Lungs clear Severe hepatic steatosis Patulous fluid-filled esophagus suggesting severe gastroesophageal reflux. Wall thickening noted. Cannot exclude esophagitis. Electronically Signed   By: Skipper Cliche M.D.   On: 03/16/2016 20:18     CBC  Recent Labs Lab 03/16/16 1822 03/17/16 0407  WBC 15.2* 10.9*  HGB 11.8* 9.6*  HCT 33.5* 27.0*  PLT 87* 69*  MCV 89.8 92.2  MCH 31.6 32.8  MCHC 35.2 35.6  RDW 15.9* 16.2*  LYMPHSABS 1.6  --   MONOABS 1.0  --   EOSABS 0.0  --   BASOSABS 0.0  --     Chemistries   Recent Labs Lab 03/16/16 1822 03/17/16 0125 03/17/16 0407  NA 125*  --  124*  K 3.5  --  2.1*  CL 81*  --  86*  CO2 26  --  24  GLUCOSE 121*  --  99  BUN 22*  --  22*  CREATININE 1.44*  --  1.16  CALCIUM 6.8*  --  5.9*  MG  --  0.7*  --   AST 36  --   --   ALT 22  --   --   ALKPHOS 115  --   --   BILITOT 1.6*  --   --    ------------------------------------------------------------------------------------------------------------------ estimated creatinine clearance is 79 mL/min (by C-G formula based on Cr of  1.16). ------------------------------------------------------------------------------------------------------------------ No results for input(s): HGBA1C in the last 72 hours. ------------------------------------------------------------------------------------------------------------------ No results for input(s): CHOL, HDL, LDLCALC, TRIG, CHOLHDL, LDLDIRECT in the last 72 hours. ------------------------------------------------------------------------------------------------------------------ No results for input(s): TSH, T4TOTAL, T3FREE, THYROIDAB in the last 72 hours.  Invalid input(s): FREET3 ------------------------------------------------------------------------------------------------------------------ No results for input(s): VITAMINB12, FOLATE, FERRITIN, TIBC, IRON, RETICCTPCT in the last 72 hours.  Coagulation profile  Recent Labs Lab 03/17/16 0407  INR 1.32    No results for input(s): DDIMER in the last 72 hours.  Cardiac Enzymes  Recent Labs Lab 03/16/16 1822 03/17/16 0125  TROPONINI 0.06* 0.03   ------------------------------------------------------------------------------------------------------------------ Invalid input(s): POCBNP   CBG: No results for input(s): GLUCAP in the last 168 hours.     Studies: Ct Head Wo Contrast  03/16/2016  CLINICAL DATA:  55 year old male with fall EXAM: CT HEAD WITHOUT CONTRAST TECHNIQUE: Contiguous axial images were obtained from the base of the skull through the vertex without intravenous contrast. COMPARISON:  Head CT dated 01/18/2016 FINDINGS: There is stable mild prominence of the ventricles and sulci compatible mild age-related atrophy. Minimal periventricular and deep white matter chronic microvascular ischemic changes noted. There is no acute intracranial hemorrhage. No mass effect or midline shift The visualized paranasal sinuses and mastoid air cells are clear. The calvarium is intact. IMPRESSION: No acute intracranial  pathology. Electronically Signed   By: Anner Crete M.D.   On: 03/16/2016 19:38   Ct Angio Chest Pe W/cm &/or Wo Cm  03/16/2016  CLINICAL DATA:  Fall, history of pulmonary embolism, systolic heart failure, dyspnea, patient on Xarelto, hypotension EXAM: CT ANGIOGRAPHY CHEST WITH CONTRAST TECHNIQUE: Multidetector CT imaging of the chest was performed using the standard protocol during bolus administration of intravenous contrast. Multiplanar CT image reconstructions and MIPs were obtained to evaluate the vascular anatomy. CONTRAST:  100 mL Isovue 370 COMPARISON:  01/18/2016 FINDINGS: No infiltrate or consolidation. No evidence of pulmonary edema. No pleural effusion. Thoracic aorta shows no dissection or dilatation. Patulous fluid-filled esophagus with mild wall thickening. No pericardial effusion. Severe fatty infiltration of the liver. No defects in the pulmonary arterial system. No acute musculoskeletal findings. Two lead cardiac pacer noted. Review of the MIP images confirms the above findings. IMPRESSION: No evidence pulmonary embolism Lungs clear Severe hepatic steatosis Patulous fluid-filled esophagus suggesting severe gastroesophageal reflux. Wall thickening noted. Cannot exclude esophagitis. Electronically Signed   By: Skipper Cliche M.D.   On: 03/16/2016 20:18      No results found for: HGBA1C Lab Results  Component Value Date   LDLCALC 23 07/14/2014   CREATININE 1.16 03/17/2016       Scheduled Meds: . folic acid  1 mg Oral Daily  . folic acid  1 mg Oral Daily  . multivitamin with minerals  1 tablet Oral Daily  . nystatin  5 mL Oral TID AC & HS  . pantoprazole  80 mg Oral Daily  .  potassium chloride  40 mEq Oral BID  . rivaroxaban  20 mg Oral Q supper  . saccharomyces boulardii  250 mg Oral BID  . sodium chloride flush  3 mL Intravenous Q12H  . thiamine  100 mg Oral Daily   Or  . thiamine  100 mg Intravenous Daily   Continuous Infusions: . magnesium sulfate 1 - 4 g bolus  IVPB    . piperacillin-tazobactam (ZOSYN)  IV 3.375 g (03/17/16 0511)  . 0.9 % sodium chloride with kcl    . sodium chloride Stopped (03/16/16 2133)  . vancomycin       LOS: 1 day    Time spent: >30 MINS    [facility] Pager [phone]. If 7PM-7AM, please contact night-coverage at [email or web address], password [facility] 03/17/2016, 8:29 AM  LOS: 1 day

## 2016-03-17 NOTE — Progress Notes (Signed)
*  PRELIMINARY RESULTS* Echocardiogram 2D Echocardiogram has been performed.  Stephen [name] 03/17/2016, 11:32 AM

## 2016-03-17 NOTE — ED Notes (Signed)
Updated Dr. Loleta Books on lab results. MD ackowldges.

## 2016-03-17 NOTE — ED Notes (Signed)
Dr. Loleta Books paged.

## 2016-03-17 NOTE — ED Notes (Signed)
Breakfast tray ordered 

## 2016-03-18 DIAGNOSIS — E871 Hypo-osmolality and hyponatremia: Secondary | ICD-10-CM

## 2016-03-18 LAB — MAGNESIUM: MAGNESIUM: 1.5 mg/dL — AB (ref 1.7–2.4)

## 2016-03-18 LAB — COMPREHENSIVE METABOLIC PANEL
ALBUMIN: 1.4 g/dL — AB (ref 3.5–5.0)
ALT: 20 U/L (ref 17–63)
AST: 28 U/L (ref 15–41)
Alkaline Phosphatase: 76 U/L (ref 38–126)
Anion gap: 11 (ref 5–15)
BILIRUBIN TOTAL: 0.8 mg/dL (ref 0.3–1.2)
BUN: 12 mg/dL (ref 6–20)
CO2: 23 mmol/L (ref 22–32)
Calcium: 7.4 mg/dL — ABNORMAL LOW (ref 8.9–10.3)
Chloride: 96 mmol/L — ABNORMAL LOW (ref 101–111)
Creatinine, Ser: 1.13 mg/dL (ref 0.61–1.24)
GFR calc Af Amer: >60 mL/min (ref >60)
GFR calc non Af Amer: >60 mL/min (ref >60)
GLUCOSE: 133 mg/dL — AB (ref 65–99)
POTASSIUM: 3.2 mmol/L — AB (ref 3.5–5.1)
SODIUM: 130 mmol/L — AB (ref 135–145)
TOTAL PROTEIN: 4.1 g/dL — AB (ref 6.5–8.1)

## 2016-03-18 LAB — CBC WITH DIFFERENTIAL/PLATELET
BASOS ABS: 0 10*3/uL (ref 0.0–0.1)
BASOS PCT: 0 %
EOS PCT: 1 %
Eosinophils Absolute: 0.1 10*3/uL (ref 0.0–0.7)
HCT: 25.5 % — ABNORMAL LOW (ref 39.0–52.0)
HEMOGLOBIN: 8.6 g/dL — AB (ref 13.0–17.0)
LYMPHS ABS: 1.7 10*3/uL (ref 0.7–4.0)
LYMPHS PCT: 20 %
MCH: 31.2 pg (ref 26.0–34.0)
MCHC: 33.7 g/dL (ref 30.0–36.0)
MCV: 92.4 fL (ref 78.0–100.0)
MONOS PCT: 12 %
Monocytes Absolute: 1 10*3/uL (ref 0.1–1.0)
NEUTROS ABS: 5.8 10*3/uL (ref 1.7–7.7)
Neutrophils Relative %: 67 %
Platelets: 73 10*3/uL — ABNORMAL LOW (ref 150–400)
RBC: 2.76 MIL/uL — ABNORMAL LOW (ref 4.22–5.81)
RDW: 16.2 % — ABNORMAL HIGH (ref 11.5–15.5)
WBC: 8.6 10*3/uL (ref 4.0–10.5)

## 2016-03-18 LAB — CALCIUM, IONIZED: Calcium, Ionized, Serum: 3.4 mg/dL — ABNORMAL LOW (ref 4.5–5.6)

## 2016-03-18 MED ORDER — MAGNESIUM SULFATE 50 % IJ SOLN
3.0000 g | Freq: Once | INTRAVENOUS | Status: AC
  Administered 2016-03-18: 3 g via INTRAVENOUS
  Filled 2016-03-18: qty 6

## 2016-03-18 MED ORDER — POTASSIUM CHLORIDE CRYS ER 20 MEQ PO TBCR
40.0000 meq | EXTENDED_RELEASE_TABLET | Freq: Three times a day (TID) | ORAL | Status: AC
  Administered 2016-03-18 – 2016-03-19 (×4): 40 meq via ORAL
  Filled 2016-03-18 (×4): qty 2

## 2016-03-18 NOTE — Progress Notes (Signed)
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 Recent diagnosis of esophageal ulcers on EGD. On Carafate and PPI. Patient presented with worsening imbalance and falls. Found to be hypotensive, tachycardic, hyponatremic hypokalemic and hypomagnesemic.   Assessment and plan  1. Hypotension and syncope:  This is either simple dehydration or sepsis wtihout source.  Suspected source unclear. Organism unknown, no history of DR organisms. Patient meets criteria given tachycardia, tachypnea, leukocytosis, and evidence of organ dysfunction. Lactate >4 mmol/L and repeat down to 1.8. This patient is at high risk of poor outcomes with a SOFA score of 2 (at least 2 of the following clinical criteria: respiratory rate of 22/min or greater, altered mentation, or systolic blood pressure of 100 mm Hg or less).  -Blood and urine cultures no growth so far -30 ml/kg bolus given in ED, will repeat lactic acid Continue vancomycin and Zosyn, and follow blood cultures closely UDS positive for marijuana, EtOH level negative   2. Hyponatremia: /HypokalemiaImproving Hypovolemic hyponatremia, continue fluids, -Follow serial BMP   3. AKI vs  dehydration:  -Check UA and FeNA -Fuids and trend BMP  4. Hypocalcemia: Likely secondary to hypomagnesemia Slowly improving  5. Anemia of chronic disease:  Hemoglobin 11.8> 8.6, baseline was 14.5 on 12/16/15, check FOBT, recent EGD showed superficial esophageal ulcers, continue PPI, recent colonoscopy was negative Transfuse for hemoglobin less than 8  6. Elevated troponin: Prior troponins have been mildly abnormal, continue to cycle cardiac enzymes Doubt ACS, suspect demand in setting of hypovolemia and CHF.  recent 2-D echo 10/27/15 showed EF of 40-45% 2-D echo 03/17/16 shows EF of 30-35%,  7. Thrombocytopenia: Suspect secondary to alcohol use and cirrhosis Baseline around 80 -Monitor CBC  8. Isch CM with systolic CHF: Without exacerbation EF 30-35%, needs outpatient cardiology follow-up -Strict I/Os, daily weights -Daily BMP -Fluids as above, but monitor volume status closely  9. Hx of SVT and pAF: Patient has seen Dr. Caryl Comes CHADS2Vasc 3. On anticoagulant and bisoprolol.  10. Hx of PE: CTA negative for PE. -Continue Xarelto -Trend platelets  11. Alcohol abuse: States he is sober. EtOH negative. Will defer CIWA for now.  12. HTN: -Hold spironolactone and bisoprolol given hypotension  13. Oral thrush: -Nystatin swish and spit -Probiotics  14. Prolonged QTc: Avoid ondansetron. -Phenergan or lorazepam for nausea     DVT prophylaxsis Xarelto  Code Status:  Full code    Family Communication: Discussed in detail with the patient, all imaging results, lab results explained to the patient   Disposition Plan: Anticipate discharge in 3-4 days      Consultants:  None  Procedures:  None  Antibiotics: Vancomycin -5/11 Zosyn-5/11      HPI/Subjective: Blood pressure still soft  Objective: Filed Vitals:   03/17/16 2025 03/17/16 2241 03/18/16 0150 03/18/16 [id number]  BP: 84/52 97/62  95/71  Pulse: 94 104  110  Temp: 98.2 F (36.8 C)   98.7 F  (37.1 C)  TempSrc: Oral   Oral  Resp: 16     Height:      Weight:   79.742 kg (175 lb 12.8 oz)   SpO2: 98%   96%    Intake/Output Summary (Last 24 hours) at 03/18/16 0858 Last data filed at 03/18/16 0530  Gross per 24 hour  Intake      0 ml  Output   1125 ml  Net  -1125 ml    Exam:  Examination:  General exam: Appears calm and comfortable  Respiratory system: Clear to auscultation. Respiratory effort normal. Cardiovascular system: S1 & S2 heard, RRR. No JVD, murmurs, rubs, gallops or clicks. No pedal edema. Gastrointestinal system: Abdomen is nondistended, soft and nontender. No organomegaly or masses felt. Normal bowel sounds heard. Central nervous system: Alert and oriented. No focal neurological deficits. Extremities: Symmetric 5 x 5 power. Skin: No rashes, lesions or ulcers Psychiatry: Judgement and insight appear normal. Mood & affect appropriate.     Data Reviewed: I have personally reviewed following labs and imaging studies  Micro Results Recent Results (from the past 240 hour(s))  Blood Culture (routine x 2)     Status: None (Preliminary result)   Collection Time: 03/16/16 10:10 PM  Result Value Ref Range Status   Specimen Description BLOOD RIGHT ANTECUBITAL  Final   Special Requests BOTTLES DRAWN AEROBIC ONLY 10CC  Final   Culture NO GROWTH < 24 HOURS  Final   Report Status PENDING  Incomplete  Blood Culture (routine x 2)     Status: None (Preliminary result)   Collection Time: 03/16/16 10:15 PM  Result Value Ref Range Status   Specimen Description BLOOD RIGHT HAND  Final   Special Requests IN PEDIATRIC BOTTLE 1CC  Final   Culture NO GROWTH < 24 HOURS  Final   Report Status PENDING  Incomplete    Radiology Reports Ct Head Wo Contrast  03/16/2016  CLINICAL DATA:  55 year old male with fall EXAM: CT HEAD WITHOUT CONTRAST TECHNIQUE: Contiguous axial images were obtained from the base of the skull through the vertex without intravenous contrast. COMPARISON:   Head CT dated 01/18/2016 FINDINGS: There is stable mild prominence of the ventricles and sulci compatible mild age-related atrophy. Minimal periventricular and deep white matter chronic microvascular ischemic changes noted. There is no acute intracranial hemorrhage. No mass effect or midline shift The visualized paranasal sinuses and mastoid air cells are clear. The calvarium is intact. IMPRESSION: No acute intracranial pathology. Electronically Signed   By: Anner Crete M.D.   On: 03/16/2016 19:38   Ct Angio Chest Pe W/cm &/or Wo Cm  03/16/2016  CLINICAL DATA:  Fall, history of pulmonary embolism, systolic heart failure, dyspnea, patient on Xarelto, hypotension EXAM: CT ANGIOGRAPHY CHEST WITH CONTRAST TECHNIQUE: Multidetector CT imaging of the chest was performed using the standard protocol during bolus administration of intravenous contrast. Multiplanar CT image reconstructions and MIPs were obtained to evaluate the vascular anatomy. CONTRAST:  100 mL Isovue 370 COMPARISON:  01/18/2016 FINDINGS: No infiltrate or consolidation. No evidence of pulmonary edema. No pleural effusion. Thoracic aorta shows no dissection or dilatation. Patulous fluid-filled esophagus with mild wall thickening. No pericardial effusion. Severe fatty infiltration of the liver. No defects in the pulmonary arterial system. No acute musculoskeletal findings. Two lead cardiac pacer noted. Review of the MIP images confirms the above findings. IMPRESSION: No  evidence pulmonary embolism Lungs clear Severe hepatic steatosis Patulous fluid-filled esophagus suggesting severe gastroesophageal reflux. Wall thickening noted. Cannot exclude esophagitis. Electronically Signed   By: Skipper Cliche M.D.   On: 03/16/2016 20:18     CBC  Recent Labs Lab 03/16/16 1822 03/17/16 0407 03/18/16 0542  WBC 15.2* 10.9* 8.6  HGB 11.8* 9.6* 8.6*  HCT 33.5* 27.0* 25.5*  PLT 87* 69* 73*  MCV 89.8 92.2 92.4  MCH 31.6 32.8 31.2  MCHC 35.2 35.6 33.7   RDW 15.9* 16.2* 16.2*  LYMPHSABS 1.6  --  1.7  MONOABS 1.0  --  1.0  EOSABS 0.0  --  0.1  BASOSABS 0.0  --  0.0    Chemistries   Recent Labs Lab 03/16/16 1822 03/17/16 0125 03/17/16 0407 03/17/16 0922 03/18/16 0542  NA 125*  --  124* 126* 130*  K 3.5  --  2.1* 2.5* 3.2*  CL 81*  --  86* 88* 96*  CO2 26  --  24 21* 23  GLUCOSE 121*  --  99 95 133*  BUN 22*  --  22* 20 12  CREATININE 1.44*  --  1.16 1.26* 1.13  CALCIUM 6.8*  --  5.9* 7.0* 7.4*  MG  --  0.7*  --  1.3* 1.5*  AST 36  --   --  36 28  ALT 22  --   --  22 20  ALKPHOS 115  --   --  108 76  BILITOT 1.6*  --   --  1.8* 0.8   ------------------------------------------------------------------------------------------------------------------ estimated creatinine clearance is 81.1 mL/min (by C-G formula based on Cr of 1.13). ------------------------------------------------------------------------------------------------------------------ No results for input(s): HGBA1C in the last 72 hours. ------------------------------------------------------------------------------------------------------------------ No results for input(s): CHOL, HDL, LDLCALC, TRIG, CHOLHDL, LDLDIRECT in the last 72 hours. ------------------------------------------------------------------------------------------------------------------  Recent Labs  03/17/16 0922  TSH 1.918   ------------------------------------------------------------------------------------------------------------------ No results for input(s): VITAMINB12, FOLATE, FERRITIN, TIBC, IRON, RETICCTPCT in the last 72 hours.  Coagulation profile  Recent Labs Lab 03/17/16 0407  INR 1.32    No results for input(s): DDIMER in the last 72 hours.  Cardiac Enzymes  Recent Labs Lab 03/17/16 0922 03/17/16 1604 03/17/16 2225  TROPONINI 0.04* 0.05* <0.03    ------------------------------------------------------------------------------------------------------------------ Invalid input(s): POCBNP   CBG: No results for input(s): GLUCAP in the last 168 hours.     Studies: Ct Head Wo Contrast  03/16/2016  CLINICAL DATA:  55 year old male with fall EXAM: CT HEAD WITHOUT CONTRAST TECHNIQUE: Contiguous axial images were obtained from the base of the skull through the vertex without intravenous contrast. COMPARISON:  Head CT dated 01/18/2016 FINDINGS: There is stable mild prominence of the ventricles and sulci compatible mild age-related atrophy. Minimal periventricular and deep white matter chronic microvascular ischemic changes noted. There is no acute intracranial hemorrhage. No mass effect or midline shift The visualized paranasal sinuses and mastoid air cells are clear. The calvarium is intact. IMPRESSION: No acute intracranial pathology. Electronically Signed   By: Anner Crete M.D.   On: 03/16/2016 19:38   Ct Angio Chest Pe W/cm &/or Wo Cm  03/16/2016  CLINICAL DATA:  Fall, history of pulmonary embolism, systolic heart failure, dyspnea, patient on Xarelto, hypotension EXAM: CT ANGIOGRAPHY CHEST WITH CONTRAST TECHNIQUE: Multidetector CT imaging of the chest was performed using the standard protocol during bolus administration of intravenous contrast. Multiplanar CT image reconstructions and MIPs were obtained to evaluate the vascular anatomy. CONTRAST:  100 mL Isovue 370 COMPARISON:  01/18/2016 FINDINGS: No infiltrate or consolidation.  No evidence of pulmonary edema. No pleural effusion. Thoracic aorta shows no dissection or dilatation. Patulous fluid-filled esophagus with mild wall thickening. No pericardial effusion. Severe fatty infiltration of the liver. No defects in the pulmonary arterial system. No acute musculoskeletal findings. Two lead cardiac pacer noted. Review of the MIP images confirms the above findings. IMPRESSION: No evidence  pulmonary embolism Lungs clear Severe hepatic steatosis Patulous fluid-filled esophagus suggesting severe gastroesophageal reflux. Wall thickening noted. Cannot exclude esophagitis. Electronically Signed   By: Skipper Cliche M.D.   On: 03/16/2016 20:18      No results found for: HGBA1C Lab Results  Component Value Date   LDLCALC 23 07/14/2014   CREATININE 1.13 03/18/2016       Scheduled Meds: . folic acid  1 mg Oral Daily  . multivitamin with minerals  1 tablet Oral Daily  . nystatin  5 mL Oral TID AC & HS  . pantoprazole  80 mg Oral Daily  . piperacillin-tazobactam (ZOSYN)  IV  3.375 g Intravenous Q8H  . potassium chloride  40 mEq Oral BID  . rivaroxaban  20 mg Oral Q supper  . saccharomyces boulardii  250 mg Oral BID  . sodium chloride  500 mL Intravenous Once  . sodium chloride flush  3 mL Intravenous Q12H  . thiamine  100 mg Oral Daily   Or  . thiamine  100 mg Intravenous Daily  . vancomycin  1,000 mg Intravenous Q12H   Continuous Infusions: . 0.9 % sodium chloride with kcl 100 mL/hr at 03/18/16 0117     LOS: 2 days    Time spent: >30 MINS    [facility]  [facility] Pager [phone]. If 7PM-7AM, please contact night-coverage at [email or web address], password [facility] 03/18/2016, 8:58 AM  LOS: 2 days

## 2016-03-19 LAB — COMPREHENSIVE METABOLIC PANEL
ALT: 25 U/L (ref 17–63)
ANION GAP: 9 (ref 5–15)
AST: 36 U/L (ref 15–41)
Albumin: 1.5 g/dL — ABNORMAL LOW (ref 3.5–5.0)
Alkaline Phosphatase: 86 U/L (ref 38–126)
BUN: 12 mg/dL (ref 6–20)
CHLORIDE: 95 mmol/L — AB (ref 101–111)
CO2: 27 mmol/L (ref 22–32)
Calcium: 8 mg/dL — ABNORMAL LOW (ref 8.9–10.3)
Creatinine, Ser: 0.88 mg/dL (ref 0.61–1.24)
Glucose, Bld: 90 mg/dL (ref 65–99)
Potassium: 4.5 mmol/L (ref 3.5–5.1)
SODIUM: 131 mmol/L — AB (ref 135–145)
Total Bilirubin: 0.7 mg/dL (ref 0.3–1.2)
Total Protein: 4.5 g/dL — ABNORMAL LOW (ref 6.5–8.1)

## 2016-03-19 LAB — CBC WITH DIFFERENTIAL/PLATELET
BASOS PCT: 1 %
Basophils Absolute: 0.1 10*3/uL (ref 0.0–0.1)
EOS ABS: 0.1 10*3/uL (ref 0.0–0.7)
EOS PCT: 1 %
HCT: 25.7 % — ABNORMAL LOW (ref 39.0–52.0)
Hemoglobin: 8.6 g/dL — ABNORMAL LOW (ref 13.0–17.0)
Lymphocytes Relative: 28 %
Lymphs Abs: 2.6 10*3/uL (ref 0.7–4.0)
MCH: 32.2 pg (ref 26.0–34.0)
MCHC: 33.5 g/dL (ref 30.0–36.0)
MCV: 96.3 fL (ref 78.0–100.0)
MONO ABS: 1 10*3/uL (ref 0.1–1.0)
MONOS PCT: 11 %
NEUTROS PCT: 60 %
Neutro Abs: 5.6 10*3/uL (ref 1.7–7.7)
PLATELETS: 87 10*3/uL — AB (ref 150–400)
RBC: 2.67 MIL/uL — ABNORMAL LOW (ref 4.22–5.81)
RDW: 16.7 % — AB (ref 11.5–15.5)
WBC: 9.3 10*3/uL (ref 4.0–10.5)

## 2016-03-19 LAB — URINE CULTURE

## 2016-03-19 LAB — CALCIUM, IONIZED: CALCIUM, IONIZED, SERUM: 4.2 mg/dL — AB (ref 4.5–5.6)

## 2016-03-19 LAB — LIPASE, BLOOD: LIPASE: 36 U/L (ref 11–51)

## 2016-03-19 LAB — MAGNESIUM: MAGNESIUM: 1.5 mg/dL — AB (ref 1.7–2.4)

## 2016-03-19 MED ORDER — MAGNESIUM SULFATE 50 % IJ SOLN
3.0000 g | Freq: Once | INTRAVENOUS | Status: AC
  Administered 2016-03-19: 3 g via INTRAVENOUS
  Filled 2016-03-19: qty 6

## 2016-03-19 NOTE — Progress Notes (Signed)
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 Recent diagnosis of esophageal ulcers on EGD. On Carafate and PPI. Patient presented with worsening imbalance and falls. Found to be hypotensive, tachycardic, hyponatremic hypokalemic and hypomagnesemic.   Assessment and plan  1. Hypotension and syncope:  Dehydration vs due to cardiomyopathy with NSVT  Patient has been treated for multiple electrolyte abnormalities during this admission.  Patient met criteria for tachycardia, tachypnea, leukocytosis, and evidence of organ dysfunction. Lactate >4 mmol/L and repeat down to 1.8. This patient is at high risk of poor outcomes with a SOFA score of 2 (at least 2 of the following clinical criteria: respiratory rate of 22/min or greater, altered mentation, or systolic blood pressure of 100 mm Hg or less).  -Blood culture no growth so far, urine culture positive for staph coag negative      Patient has been treated with vancomycin and Zosyn DC Zosyn , no pneumonia on CT scan UDS positive for marijuana, EtOH level negative              Replete electrolytes as the patient continues to have nonsustained ventricular tachycardia due to low magnesium,  low EF Consider cardiology consultation in the morning, patient was seen by EP  12/16/15 , has a defibrillator in place since 2006   2. Hyponatremia: /Hypokalemia/hypomagnesemiaImproving Hypovolemic hyponatremia, continue fluids, -Follow serial BMP   3. AKI vs dehydration:  History of present illness resolved  4. Hypocalcemia: Likely secondary to hypomagnesemia Slowly improving   5. Anemia of chronic disease:  Hemoglobin 11.8> 8.6, baseline was 14.5 on 12/16/15, check FOBT, recent EGD showed superficial esophageal ulcers, continue PPI, recent colonoscopy was negative Transfuse for hemoglobin less than 8  6. Elevated troponin: Prior troponins have been mildly abnormal, continue to cycle cardiac enzymes Doubt ACS, suspect demand in setting of hypovolemia and CHF.  recent 2-D echo 10/27/15 showed EF of 40-45% 2-D echo 03/17/16 shows EF of 30-35%,  7. Thrombocytopenia: Suspect secondary to alcohol use and cirrhosis Baseline around 80 -Monitor CBC  8. Isch CM with systolic CHF: Without exacerbation EF 30-35%, needs outpatient cardiology follow-up -Strict I/Os, daily weights -Daily BMP -Fluids as above, but monitor volume status closely  9. Hx of SVT and pAF: Patient has seen Dr. Caryl Comes CHADS2Vasc 3. On xarelto  and bisoprolol.  10. Hx of PE: CTA negative for PE. -Continue Xarelto -Trend platelets  11. Alcohol abuse: States he is sober. EtOH negative.  No withdrawal during this admission  12. HTN: -Hold spironolactone and bisoprolol given hypotension  13. Oral thrush: -Nystatin swish and spit -Probiotics  14. Prolonged QTc: Avoid ondansetron. -Phenergan or lorazepam for nausea     DVT prophylaxsis Xarelto  Code Status:  Full code    Family Communication: Discussed in detail with the patient, all  imaging results, lab results explained to the patient   Disposition Plan: Anticipate discharge Monday       Consultants:  None  Procedures:  None  Antibiotics: Vancomycin -5/11 Zosyn-5/11- 5/ 14       HPI/Subjective: Patient continues to have retching and dry heaving, no hematemesis  Objective: Filed Vitals:   03/18/16 1500 03/18/16 2141 03/19/16 0406 03/19/16 0607  BP: 91/58 95/66 116/80   Pulse: 108 117 103 101  Temp: 98.7 F (37.1 C) 97.6 F (36.4 C) 98 F (36.7 C)   TempSrc: Oral Oral Oral   Resp: _0 Height:      Weight:   82.237 kg (181 lb 4.8 oz)   SpO2: 99% 99% 100%     Intake/Output Summary (Last 24 hours) at 03/19/16 0850 Last data filed at 03/19/16 0432  Gross per 24 hour  Intake   1520 ml  Output   1000 ml  Net    520 ml    Exam:  Examination:  General exam: Appears calm and comfortable  Respiratory system: Clear to auscultation. Respiratory effort normal. Cardiovascular system: S1 & S2 heard, RRR. No JVD, murmurs, rubs, gallops or clicks. No pedal edema. Gastrointestinal system: Abdomen is nondistended, soft and nontender. No organomegaly or masses felt. Normal bowel sounds heard. Central nervous system: Alert and oriented. No focal neurological deficits. Extremities: Symmetric 5 x 5 power. Skin: No rashes, lesions or ulcers Psychiatry: Judgement and insight appear normal. Mood & affect appropriate.     Data Reviewed: I have personally reviewed following labs and imaging studies  Micro Results Recent Results (from the past 240 hour(s))  Blood Culture (routine x 2)     Status: None (Preliminary result)   Collection Time: 03/16/16 10:10 PM  Result Value Ref Range Status   Specimen Description BLOOD RIGHT ANTECUBITAL  Final   Special Requests BOTTLES DRAWN AEROBIC ONLY 10CC  Final   Culture NO GROWTH 2 DAYS  Final   Report Status PENDING  Incomplete  Blood Culture (routine x 2)     Status: None (Preliminary result)   Collection Time: 03/16/16 10:15 PM  Result Value Ref Range Status   Specimen Description BLOOD RIGHT HAND  Final    Special Requests IN PEDIATRIC BOTTLE 1CC  Final   Culture NO GROWTH 2 DAYS  Final   Report Status PENDING  Incomplete  Urine culture     Status: Abnormal   Collection Time: 03/16/16 11:52 PM  Result Value Ref Range Status   Specimen Description URINE, CLEAN CATCH  Final   Special Requests NONE  Final   Culture (A)  Final    >=100,000 COLONIES/mL STAPHYLOCOCCUS SPECIES (COAGULASE NEGATIVE)   Report Status 03/19/2016 FINAL  Final   Organism ID, Bacteria STAPHYLOCOCCUS SPECIES (COAGULASE NEGATIVE) (A)  Final      Susceptibility   Staphylococcus species (coagulase negative) - MIC*    CIPROFLOXACIN <=0.5 SENSITIVE Sensitive     GENTAMICIN <=0.5 SENSITIVE Sensitive     NITROFURANTOIN <=16 SENSITIVE Sensitive     OXACILLIN >=4 RESISTANT Resistant     TETRACYCLINE 2 SENSITIVE Sensitive     VANCOMYCIN 1 SENSITIVE Sensitive     TRIMETH/SULFA >=320 RESISTANT Resistant     CLINDAMYCIN <=0.25 SENSITIVE Sensitive     RIFAMPIN <=0.5 SENSITIVE Sensitive     Inducible Clindamycin NEGATIVE Sensitive     * >=100,000 COLONIES/mL STAPHYLOCOCCUS SPECIES (COAGULASE NEGATIVE)    Radiology Reports Ct Head Wo Contrast  03/16/2016  CLINICAL DATA:  55 year old male with fall EXAM: CT HEAD WITHOUT CONTRAST TECHNIQUE: Contiguous axial images were obtained from the base of the skull through the vertex without intravenous contrast. COMPARISON:  Head CT dated 01/18/2016 FINDINGS: There is stable mild prominence of the ventricles and sulci compatible mild age-related atrophy. Minimal periventricular and deep white matter chronic microvascular ischemic changes noted. There is no acute intracranial hemorrhage. No mass effect or midline shift The visualized paranasal sinuses and mastoid air cells are clear. The calvarium is intact. IMPRESSION: No acute intracranial pathology. Electronically Signed   By: Anner Crete M.D.   On: 03/16/2016 19:38   Ct Angio Chest Pe W/cm &/or Wo Cm  03/16/2016  CLINICAL DATA:  Fall,  history of pulmonary embolism, systolic heart failure, dyspnea, patient on Xarelto, hypotension EXAM: CT ANGIOGRAPHY CHEST WITH CONTRAST TECHNIQUE: Multidetector CT imaging of the chest was performed using the standard protocol during bolus administration of intravenous contrast. Multiplanar CT image reconstructions and MIPs were obtained to evaluate the vascular anatomy. CONTRAST:  100 mL Isovue 370 COMPARISON:  01/18/2016 FINDINGS: No infiltrate or consolidation. No evidence of pulmonary edema. No pleural effusion. Thoracic aorta shows no dissection or dilatation. Patulous fluid-filled esophagus with mild wall thickening. No pericardial effusion. Severe fatty infiltration of the liver. No defects in the pulmonary arterial system. No acute musculoskeletal findings. Two lead cardiac pacer noted. Review of the MIP images confirms the above findings. IMPRESSION: No evidence pulmonary embolism Lungs clear Severe hepatic steatosis Patulous fluid-filled esophagus suggesting severe gastroesophageal reflux. Wall thickening noted. Cannot exclude esophagitis. Electronically Signed   By: Skipper Cliche M.D.   On: 03/16/2016 20:18     CBC  Recent Labs Lab 03/16/16 1822 03/17/16 0407 03/18/16 0542 03/19/16 0505  WBC 15.2* 10.9* 8.6 9.3  HGB 11.8* 9.6* 8.6* 8.6*  HCT 33.5* 27.0* 25.5* 25.7*  PLT 87* 69* 73* 87*  MCV 89.8 92.2 92.4 96.3  MCH 31.6 32.8 31.2 32.2  MCHC 35.2 35.6 33.7 33.5  RDW 15.9* 16.2* 16.2* 16.7*  LYMPHSABS 1.6  --  1.7 2.6  MONOABS 1.0  --  1.0 1.0  EOSABS 0.0  --  0.1 0.1  BASOSABS 0.0  --  0.0 0.1    Chemistries   Recent Labs Lab 03/16/16 1822 03/17/16 0125 03/17/16 0407 03/17/16 0922 03/18/16 0542 03/19/16 0505  NA 125*  --  124* 126* 130* 131*  K 3.5  --  2.1* 2.5* 3.2* 4.5  CL 81*  --  86* 88* 96* 95*  CO2 26  --  24 21* 23 27  GLUCOSE 121*  --  99 95 133* 90  BUN 22*  --  22* _0 CREATININE 1.44*  --  1.16 1.26* 1.13 0.88  CALCIUM 6.8*  --  5.9* 7.0* 7.4*  8.0*  MG  --  0.7*  --  1.3* 1.5* 1.5*  AST 36  --   --  36 28 36  ALT 22  --   --  _1 ALKPHOS 115  --   --  108 76 86  BILITOT 1.6*  --   --  1.8* 0.8 0.7   ------------------------------------------------------------------------------------------------------------------ estimated creatinine clearance is 104.1 mL/min (by C-G formula based on Cr of 0.88). ------------------------------------------------------------------------------------------------------------------ No results for input(s): HGBA1C in the last 72 hours. ------------------------------------------------------------------------------------------------------------------ No results for input(s): CHOL, HDL, LDLCALC, TRIG, CHOLHDL, LDLDIRECT in the last 72 hours. ------------------------------------------------------------------------------------------------------------------  Recent Labs  03/17/16 0922  TSH 1.918   ------------------------------------------------------------------------------------------------------------------ No results for input(s): VITAMINB12,  FOLATE, FERRITIN, TIBC, IRON, RETICCTPCT in the last 72 hours.  Coagulation profile  Recent Labs Lab 03/17/16 0407  INR 1.32    No results for input(s): DDIMER in the last 72 hours.  Cardiac Enzymes  Recent Labs Lab 03/17/16 0922 03/17/16 1604 03/17/16 2225  TROPONINI 0.04* 0.05* <0.03   ------------------------------------------------------------------------------------------------------------------ Invalid input(s): POCBNP   CBG: No results for input(s): GLUCAP in the last 168 hours.     Studies: No results found.    No results found for: HGBA1C Lab Results  Component Value Date   LDLCALC 23 07/14/2014   CREATININE 0.88 03/19/2016       Scheduled Meds: . folic acid  1 mg Oral Daily  . magnesium sulfate 1 - 4 g bolus IVPB  3 g Intravenous Once  . multivitamin with minerals  1 tablet Oral Daily  . nystatin  5 mL Oral  TID AC & HS  . pantoprazole  80 mg Oral Daily  . piperacillin-tazobactam (ZOSYN)  IV  3.375 g Intravenous Q8H  . potassium chloride  40 mEq Oral TID  . rivaroxaban  20 mg Oral Q supper  . saccharomyces boulardii  250 mg Oral BID  . sodium chloride  500 mL Intravenous Once  . sodium chloride flush  3 mL Intravenous Q12H  . thiamine  100 mg Oral Daily  . vancomycin  1,000 mg Intravenous Q12H   Continuous Infusions:     LOS: 3 days    Time spent: >30 MINS    [facility]  [facility] Pager [phone]. If 7PM-7AM, please contact night-coverage at [email or web address], password [facility] 03/19/2016, 8:50 AM  LOS: 3 days

## 2016-03-19 NOTE — Evaluation (Signed)
Physical Therapy Evaluation Patient Details Name: Stephen [name] MRN: [id number] DOB: [birth year] Today's Date: 03/19/2016   History of Present Illness  Pt is a 55 y.o. male with a past medical history significant for ischemic cardiomyopathy with ICD, HTN, alcohol dependence in remission 6 weeks, and hx of PE on rivaroxaban who presents with syncope at his PCP's office. Hypotension and syncope w/ diff dx: dehydration vs. cardiomyopathy w/ NSVT.      Clinical Impression  Pt admitted with above diagnosis. Pt currently with functional limitations due to the deficits listed below (see PT Problem List). Stephen [name] presents w/ impaired safety awareness and becomes orthostatic w/ short distance ambulation.  BP sitting 101/75, standing 76/56.  Pt lives alone and reports falling 3x/wk. Pt is a high fall risk, recommending SNF at d/c. Pt will benefit from skilled PT to increase their independence and safety with mobility to allow discharge to the venue listed below.      Follow Up Recommendations SNF;Supervision for mobility/OOB    Equipment Recommendations  Other (comment) (TBD at next venue of care)    Recommendations for Other Services       Precautions / Restrictions Precautions Precautions: Fall Precaution Comments: orthostatic Restrictions Weight Bearing Restrictions: No      Mobility  Bed Mobility Overal bed mobility: Needs Assistance Bed Mobility: Supine to Sit     Supine to sit: Min guard;HOB elevated     General bed mobility comments: No dizzines and no physical assist or cues needed but close min guard due to +nausea  Transfers Overall transfer level: Needs assistance Equipment used: Rolling walker (2 wheeled) Transfers: Sit to/from Stand Sit to Stand: Min assist         General transfer comment: Min assist to steady and cues for hand placement.    Ambulation/Gait Ambulation/Gait assistance: Min assist Ambulation Distance (Feet): 30 Feet Assistive device: Rolling  walker (2 wheeled) Gait Pattern/deviations: Step-through pattern;Decreased stride length   Gait velocity interpretation: Below normal speed for age/gender General Gait Details: Pt became dizzy after ambulating short distance in room and required assist to steady and to sit in chair.  BP sitting 101/75, standing 76/56  Stairs            Wheelchair Mobility    Modified Rankin (Stroke Patients Only)       Balance Overall balance assessment: History of Falls;Needs assistance Sitting-balance support: Bilateral upper extremity supported;Feet supported Sitting balance-Leahy Scale: Fair Sitting balance - Comments: Close min guard as pt nauseous sitting EOB   Standing balance support: Bilateral upper extremity supported;During functional activity Standing balance-Leahy Scale: Poor Standing balance comment: RW for support                             Pertinent Vitals/Pain Pain Assessment: No/denies pain    Home Living Family/patient expects to be discharged to:: Private residence Living Arrangements: Alone Available Help at Discharge: Family;Available PRN/intermittently Type of Home: House Home Access: Stairs to enter Entrance Stairs-Rails: Can reach both;Left;Right Entrance Stairs-Number of Steps: 3 Home Layout: One level Home Equipment: Cane - single point      Prior Function Level of Independence: Needs assistance   Gait / Transfers Assistance Needed: Daughter assists w/ tub transfer and to dry off.  Pt uses cane at all times, says he falls ~3x/wk after getting dizzy.  ADL's / Homemaking Assistance Needed: Dresses independently.        Hand Dominance   Dominant Hand:  Right    Extremity/Trunk Assessment   Upper Extremity Assessment: Overall WFL for tasks assessed           Lower Extremity Assessment: Overall WFL for tasks assessed      Cervical / Trunk Assessment: Normal  Communication   Communication: No difficulties  Cognition  Arousal/Alertness: Awake/alert Behavior During Therapy: WFL for tasks assessed/performed Overall Cognitive Status: No family/caregiver present to determine baseline cognitive functioning                      General Comments      Exercises        Assessment/Plan    PT Assessment Patient needs continued PT services  PT Diagnosis Difficulty walking   PT Problem List Decreased activity tolerance;Decreased balance;Decreased safety awareness;Decreased knowledge of use of DME  PT Treatment Interventions DME instruction;Gait training;Stair training;Functional mobility training;Therapeutic activities;Therapeutic exercise;Balance training;Cognitive remediation;Patient/family education   PT Goals (Current goals can be found in the Care Plan section) Acute Rehab PT Goals Patient Stated Goal: to go home PT Goal Formulation: With patient Time For Goal Achievement: 04/02/16 Potential to Achieve Goals: Good    Frequency Min 3X/week   Barriers to discharge Inaccessible home environment;Decreased caregiver support lives alone w/ steps to enter home    Co-evaluation               End of Session Equipment Utilized During Treatment: Gait belt Activity Tolerance: Treatment limited secondary to medical complications (Comment) (orthostasis) Patient left: in chair;with call bell/phone within reach;with chair alarm set Nurse Communication: Mobility status;Other (comment) (BP)         Time: 1323-1350 PT Time Calculation (min) (ACUTE ONLY): 27 min   Charges:   PT Evaluation $PT Eval Low Complexity: 1 Procedure     PT G Codes:       Stephen [name] PT, DPT  Pager: [phone] Phone: [phone] 03/19/2016, 2:14 PM

## 2016-03-20 LAB — CBC WITH DIFFERENTIAL/PLATELET
BASOS ABS: 0.1 10*3/uL (ref 0.0–0.1)
Basophils Relative: 1 %
EOS PCT: 1 %
Eosinophils Absolute: 0.1 10*3/uL (ref 0.0–0.7)
HEMATOCRIT: 26 % — AB (ref 39.0–52.0)
HEMOGLOBIN: 8.9 g/dL — AB (ref 13.0–17.0)
Lymphocytes Relative: 27 %
Lymphs Abs: 2.4 10*3/uL (ref 0.7–4.0)
MCH: 32.7 pg (ref 26.0–34.0)
MCHC: 34.2 g/dL (ref 30.0–36.0)
MCV: 95.6 fL (ref 78.0–100.0)
MONO ABS: 1.1 10*3/uL — AB (ref 0.1–1.0)
Monocytes Relative: 12 %
NEUTROS PCT: 59 %
Neutro Abs: 5.3 10*3/uL (ref 1.7–7.7)
Platelets: 95 10*3/uL — ABNORMAL LOW (ref 150–400)
RBC: 2.72 MIL/uL — AB (ref 4.22–5.81)
RDW: 16.8 % — ABNORMAL HIGH (ref 11.5–15.5)
WBC: 9 10*3/uL (ref 4.0–10.5)

## 2016-03-20 LAB — BASIC METABOLIC PANEL
ANION GAP: 5 (ref 5–15)
BUN: 11 mg/dL (ref 6–20)
CALCIUM: 7.8 mg/dL — AB (ref 8.9–10.3)
CHLORIDE: 98 mmol/L — AB (ref 101–111)
CO2: 26 mmol/L (ref 22–32)
Creatinine, Ser: 0.82 mg/dL (ref 0.61–1.24)
GFR calc non Af Amer: >60 mL/min (ref >60)
Glucose, Bld: 96 mg/dL (ref 65–99)
POTASSIUM: 5.8 mmol/L — AB (ref 3.5–5.1)
Sodium: 129 mmol/L — ABNORMAL LOW (ref 135–145)

## 2016-03-20 LAB — MAGNESIUM: Magnesium: 1.4 mg/dL — ABNORMAL LOW (ref 1.7–2.4)

## 2016-03-20 MED ORDER — PANTOPRAZOLE SODIUM 40 MG PO TBEC: 40.0000 mg | DELAYED_RELEASE_TABLET | Freq: Two times a day (BID) | ORAL | Status: DC

## 2016-03-20 MED ORDER — MAGNESIUM OXIDE 400 (241.3 MG) MG PO TABS: 800.0000 mg | ORAL_TABLET | Freq: Three times a day (TID) | ORAL | Status: AC

## 2016-03-20 MED ORDER — SPIRONOLACTONE 25 MG PO TABS: 12.5000 mg | ORAL_TABLET | Freq: Every day | ORAL | Status: AC

## 2016-03-20 MED ORDER — POTASSIUM CHLORIDE ER 20 MEQ PO TBCR: 40.0000 meq | EXTENDED_RELEASE_TABLET | Freq: Two times a day (BID) | ORAL | Status: AC

## 2016-03-20 MED ORDER — THIAMINE HCL 100 MG PO TABS: 100.0000 mg | ORAL_TABLET | Freq: Every day | ORAL | Status: AC

## 2016-03-20 MED ORDER — MAGNESIUM OXIDE 400 (241.3 MG) MG PO TABS
800.0000 mg | ORAL_TABLET | Freq: Three times a day (TID) | ORAL | Status: DC
  Administered 2016-03-20 (×2): 800 mg via ORAL
  Filled 2016-03-20 (×2): qty 2

## 2016-03-20 MED ORDER — SACCHAROMYCES BOULARDII 250 MG PO CAPS: 250.0000 mg | ORAL_CAPSULE | Freq: Two times a day (BID) | ORAL | Status: AC

## 2016-03-20 MED ORDER — CALCIUM CARBONATE 1250 (500 CA) MG PO CHEW: 1.0000 | CHEWABLE_TABLET | Freq: Two times a day (BID) | ORAL | Status: AC

## 2016-03-20 MED ORDER — BISOPROLOL FUMARATE 5 MG PO TABS: 5.0000 mg | ORAL_TABLET | Freq: Every day | ORAL | Status: DC

## 2016-03-20 NOTE — Clinical Social Work Note (Signed)
CSW met with patient. Patient is refusing short term rehab at [facility]. CSW called patient's wife. She reported she is supportive of patient's decision and working out arrangements for patient's daughter to live with patient. She reports that she will be able to provide patient transportation home. CSW made RNCM aware.   [employer], LCSW [phone]

## 2016-03-20 NOTE — Care Management Note (Signed)
Case Management Note Marvetta Gibbons RN, BSN Unit 2W-Case Manager [phone]  Patient Details  Name: Stephen [name] MRN: [id number] Date of Birth: 02-03-1961 Also  Subjective/Objective:   Pt admitted with Syncope, vomiting, unsteady gait             Action/Plan: PTA pt was living at home alone, recently at [facility] and also recently had HH, recommendations for STSNF per PT, CSW has spoken with pt and pt states that he does not want to return to SNF at this time- per CSW she has spoken with wife who supports pt's decision and plan is to have daughter move in with pt to assist. CSW and CM spoke with pt at bedside to confirm pt's decision regarding d/c plans.- Per discussion with pt - he states " that I have things I need to go take care of at home financially and then I will go to SNF" explained to pt that this is not the way insurance covers STSNF stays- pt still states that he wants to return home and does not want to go to STSNF at this time. Choice offered to pt for [facility] services in [facility]- pt reports that he used [facility] in the past and wants to use them again for services- pt also states that he needs a RW and 3n1 for home- have notified MD of change in D/C plans and requested [facility] and DME orders- referral made to Tim with [facility] for [facility] services- HHPT/SW- call made to [facility] with [facility] for DME needs- RW and 3n1 to be delivered to room prior to discharge- CSW has confirmed with pt's wife that she will be able to provide transportation home later this evening.   Expected Discharge Date:      03/20/16            Expected Discharge Plan:  [place]  In-House Referral:  Clinical Social Work  Discharge planning Services  CM Consult  Post Acute Care Choice:  Durable Medical Equipment, Home Health Choice offered to:  Patient  DME Arranged:  3-N-1, Walker rolling DME Agency:  [place]:  PT, Nurse's Aide, Social Work [employer] Agency:   [place]  Status of Service:  Completed, signed off  Medicare Important Message Given:    Date Medicare IM Given:    Medicare IM give by:    Date Additional Medicare IM Given:    Additional Medicare Important Message give by:     If discussed at [place] of Stay Meetings, dates discussed:    Additional Comments:  Dawayne Patricia, RN 03/20/2016, 11:47 AM

## 2016-03-20 NOTE — Evaluation (Signed)
Occupational Therapy Evaluation Patient Details Name: Stephen [name] MRN: [id number] DOB: Feb 08, 1961 Today's Date: 03/20/2016    History of Present Illness Pt is a 55 y.o. male with a past medical history significant for ischemic cardiomyopathy with ICD, HTN, alcohol dependence in remission 6 weeks, and hx of PE on rivaroxaban who presents with syncope at his PCP's office. Hypotension and syncope w/ diff dx: dehydration vs. cardiomyopathy w/ NSVT.     Clinical Impression   Pt with hx of recent falls. Presents with generalized weakness and impaired balance with shooting LE pain intermittently. Pt requiring supervision for OOB mobility and use of a walker. Educated at length in home safety and importance of using walker as long as HHPT recommends. Pt will have daughter available to assist with ADL and IADL. Recommending HHOT as pt is to discharge home later today.    Follow Up Recommendations  Home health OT    Equipment Recommendations  3 in 1 bedside comode (RW)    Recommendations for Other Services       Precautions / Restrictions Precautions Precautions: Fall Precaution Comments: orthostatic Restrictions Weight Bearing Restrictions: No      Mobility Bed Mobility Overal bed mobility: Modified Independent             General bed mobility comments: no physical assist, denies dizziness  Transfers Overall transfer level: Needs assistance Equipment used: Rolling walker (2 wheeled) Transfers: Sit to/from Stand Sit to Stand: Supervision         General transfer comment: cues for hand placement, supervision for safety    Balance     Sitting balance-Leahy Scale: Good       Standing balance-Leahy Scale: Poor                              ADL Overall ADL's : Needs assistance/impaired Eating/Feeding: Independent;Bed level   Grooming: Wash/dry hands;Standing;Supervision/safety   Upper Body Bathing: Set up;Sitting   Lower Body Bathing: Supervison/  safety;Sit to/from stand   Upper Body Dressing : Set up;Sitting   Lower Body Dressing: Supervision/safety;Sit to/from stand   Toilet Transfer: Insurance risk surveyor           Functional mobility during ADLs: Min guard;Rolling walker;Cueing for sequencing General ADL Comments: Pt able to state multiple uses of 3 in 1. Agreeable to having daughter move 3 in 1 from toilet to shower and supervision for shower transfer. Pt also agreeable to using RW until his HHPT tells him he is safe to discontinue.     Vision     Perception     Praxis      Pertinent Vitals/Pain Pain Assessment: Faces Faces Pain Scale: Hurts even more Pain Location: LE Pain Descriptors / Indicators: Shooting;Stabbing Pain Intervention(s): Monitored during session     Hand Dominance Right   Extremity/Trunk Assessment Upper Extremity Assessment Upper Extremity Assessment: Overall WFL for tasks assessed   Lower Extremity Assessment Lower Extremity Assessment: Overall WFL for tasks assessed (reports he has neuropathy)   Cervical / Trunk Assessment Cervical / Trunk Assessment: Normal   Communication Communication Communication: No difficulties   Cognition Arousal/Alertness: Awake/alert Behavior During Therapy: WFL for tasks assessed/performed Overall Cognitive Status: No family/caregiver present to determine baseline cognitive functioning                     General Comments       Exercises       Shoulder Instructions  Home Living Family/patient expects to be discharged to:: Private residence Living Arrangements: Alone Available Help at Discharge: Family;Available PRN/intermittently Type of Home: House Home Access: Stairs to enter [employer] of Steps: 3 Entrance Stairs-Rails: Can reach both;Left;Right Home Layout: One level     Bathroom Shower/Tub: Occupational psychologist: Standard     Home Equipment: Cane - single point           Prior Functioning/Environment Level of Independence: Needs assistance  Gait / Transfers Assistance Needed: walks with cane, hx of falls ADL's / Homemaking Assistance Needed: daughter helps with shower transfer and sometimes drying feet, dresses independently, states he does not have any trouble preparing meals        OT Diagnosis: Generalized weakness;Acute pain;Cognitive deficits   OT Problem List: Impaired balance (sitting and/or standing);Decreased activity tolerance;Decreased strength;Decreased cognition;Decreased safety awareness;Decreased knowledge of use of DME or AE;Pain   OT Treatment/Interventions:      OT Goals(Current goals can be found in the care plan section) Acute Rehab OT Goals Patient Stated Goal: to go home  OT Frequency:     Barriers to D/C:            Co-evaluation              End of Session Equipment Utilized During Treatment: Gait belt;Rolling walker Nurse Communication: Mobility status  Activity Tolerance: Patient tolerated treatment well Patient left: in bed;with call bell/phone within reach   Time: 1131-1200 OT Time Calculation (min): 29 min Charges:  OT General Charges $OT Visit: 1 Procedure OT Evaluation $OT Eval Moderate Complexity: 1 Procedure OT Treatments $Self Care/Home Management : 8-22 mins G-Codes:    Malka So 03/20/2016, 12:12 PM  [phone]

## 2016-03-20 NOTE — Care Management Important Message (Signed)
Important Message  Patient Details  Name: Stephen [name] MRN: [id number] Date of Birth: Dec 13, 1960   Medicare Important Message Given:  Yes    Dawayne Patricia, RN 03/20/2016, 2:56 PM

## 2016-03-20 NOTE — Discharge Summary (Addendum)
Physician Discharge Summary  Stephen [name] MRN: [id number] DOB/AGE: February 10, 1961 55 y.o.  PCP: [name], MD   Admit date: 03/16/2016 Discharge date: 03/20/2016  Discharge Diagnoses:     Principal Problem:   Hypotension Active Problems:   Essential hypertension   Cardiac defibrillator-[employer]-single-chamber   Cardiomyopathy, ischemic   Thrombocytopenia (HCC)   Chronic systolic CHF (congestive heart failure) (HCC)   Hyponatremia   Elevated serum creatinine   Hypocalcemia   Elevated troponin   Anemia, chronic disease   Oral thrush   Acute hyponatremia Sepsis ruled out   Follow-up recommendations Follow-up with PCP in 3-5 days , including all  additional recommended appointments as below Recommend CBC, CMP, magnesium every 3 days  Patient needs to follow-up with cardiology for interrogation of ICD, sustained NSVT , syncope    Current Discharge Medication List    START taking these medications   Details  calcium carbonate (OS-CAL) 1250 (500 Ca) MG chewable tablet Chew 1 tablet (1,250 mg total) by mouth 2 (two) times daily. Qty: 60 tablet, Refills: 1    magnesium oxide (MAG-OX) 400 (241.3 Mg) MG tablet Take 2 tablets (800 mg total) by mouth 3 (three) times daily. Qty: 90 tablet, Refills: 1    pantoprazole (PROTONIX) 40 MG tablet Take 1 tablet (40 mg total) by mouth 2 (two) times daily. Qty: 60 tablet, Refills: 1    saccharomyces boulardii (FLORASTOR) 250 MG capsule Take 1 capsule (250 mg total) by mouth 2 (two) times daily. Qty: 60 capsule, Refills: 0    thiamine 100 MG tablet Take 1 tablet (100 mg total) by mouth daily. Qty: 30 tablet, Refills: 1      CONTINUE these medications which have CHANGED   Details  bisoprolol (ZEBETA) 5 MG tablet Take 1 tablet (5 mg total) by mouth daily. Qty: 30 tablet, Refills: 0    potassium chloride 20 MEQ TBCR Take 40 mEq by mouth 2 (two) times daily. Qty: 120 tablet, Refills: 0    spironolactone (ALDACTONE) 25 MG  tablet Take 0.5 tablets (12.5 mg total) by mouth daily. Qty: 30 tablet, Refills: 0      CONTINUE these medications which have NOT CHANGED   Details  acetaminophen (TYLENOL) 325 MG tablet Take 650 mg by mouth every 6 (six) hours as needed for moderate pain.    aspirin 81 MG EC tablet Take 1 tablet (81 mg total) by mouth every other day. Qty: 30 tablet, Refills: 12    cholecalciferol (VITAMIN D) 1000 UNITS tablet Take 1,000 Units by mouth daily.     folic acid (FOLVITE) 1 MG tablet Take 1 mg by mouth daily.     nitroGLYCERIN (NITROSTAT) 0.4 MG SL tablet Place 0.4 mg under the tongue every 5 (five) minutes as needed for chest pain.    rivaroxaban (XARELTO) 20 MG TABS tablet Take 1 tablet (20 mg total) by mouth daily with supper. Qty: 30 tablet    tiotropium (SPIRIVA) 18 MCG inhalation capsule Place 18 mcg into inhaler and inhale daily as needed (shortness of breath).     sucralfate (CARAFATE) 1 GM/10ML suspension Take 10 mLs (1 g total) by mouth 4 (four) times daily -  with meals and at bedtime. Qty: 420 mL, Refills: 0      STOP taking these medications     omeprazole (PRILOSEC) 40 MG capsule           Discharge Condition: Prognosis poor given cardiomyopathy   Discharge Instructions Get Medicines reviewed and adjusted: Please take all your  medications with you for your next visit with your Primary MD  Please request your Primary MD to go over all hospital tests and procedure/radiological results at the follow up, please ask your Primary MD to get all Hospital records sent to his/her office.  If you experience worsening of your admission symptoms, develop shortness of breath, life threatening emergency, suicidal or homicidal thoughts you must seek medical attention immediately by calling 911 or calling your MD immediately if symptoms less severe.  You must read complete instructions/literature along with all the possible adverse reactions/side effects for all the Medicines  you take and that have been prescribed to you. Take any new Medicines after you have completely understood and accpet all the possible adverse reactions/side effects.   Do not drive when taking Pain medications.   Do not take more than prescribed Pain, Sleep and Anxiety Medications  Special Instructions: If you have smoked or chewed Tobacco in the last 2 yrs please stop smoking, stop any regular Alcohol and or any Recreational drug use.  Wear Seat belts while driving.  Please note  You were cared for by a hospitalist during your hospital stay. Once you are discharged, your primary care physician will handle any further medical issues. Please note that NO REFILLS for any discharge medications will be authorized once you are discharged, as it is imperative that you return to your primary care physician (or establish a relationship with a primary care physician if you do not have one) for your aftercare needs so that they can reassess your need for medications and monitor your lab values.     No Known Allergies    Disposition: 01-Home or Self Care   Consults:  None     Significant Diagnostic Studies:  Ct Head Wo Contrast  03/16/2016  CLINICAL DATA:  55 year old male with fall EXAM: CT HEAD WITHOUT CONTRAST TECHNIQUE: Contiguous axial images were obtained from the base of the skull through the vertex without intravenous contrast. COMPARISON:  Head CT dated 01/18/2016 FINDINGS: There is stable mild prominence of the ventricles and sulci compatible mild age-related atrophy. Minimal periventricular and deep white matter chronic microvascular ischemic changes noted. There is no acute intracranial hemorrhage. No mass effect or midline shift The visualized paranasal sinuses and mastoid air cells are clear. The calvarium is intact. IMPRESSION: No acute intracranial pathology. Electronically Signed   By: Anner Crete M.D.   On: 03/16/2016 19:38   Ct Angio Chest Pe W/cm &/or Wo  Cm  03/16/2016  CLINICAL DATA:  Fall, history of pulmonary embolism, systolic heart failure, dyspnea, patient on Xarelto, hypotension EXAM: CT ANGIOGRAPHY CHEST WITH CONTRAST TECHNIQUE: Multidetector CT imaging of the chest was performed using the standard protocol during bolus administration of intravenous contrast. Multiplanar CT image reconstructions and MIPs were obtained to evaluate the vascular anatomy. CONTRAST:  100 mL Isovue 370 COMPARISON:  01/18/2016 FINDINGS: No infiltrate or consolidation. No evidence of pulmonary edema. No pleural effusion. Thoracic aorta shows no dissection or dilatation. Patulous fluid-filled esophagus with mild wall thickening. No pericardial effusion. Severe fatty infiltration of the liver. No defects in the pulmonary arterial system. No acute musculoskeletal findings. Two lead cardiac pacer noted. Review of the MIP images confirms the above findings. IMPRESSION: No evidence pulmonary embolism Lungs clear Severe hepatic steatosis Patulous fluid-filled esophagus suggesting severe gastroesophageal reflux. Wall thickening noted. Cannot exclude esophagitis. Electronically Signed   By: Skipper Cliche M.D.   On: 03/16/2016 20:18     2-D echo LV EF: 30% -  35%  ------------------------------------------------------------------- Indications: CHF - 428.0.  ------------------------------------------------------------------- History: PMH: Elevated Troponin. SVT, ETOH. Coronary artery disease. Risk factors: Current tobacco use. Hypertension. Dyslipidemia.  ------------------------------------------------------------------- Study Conclusions  - Left ventricle: The cavity size was normal. Wall thickness was  increased in a pattern of mild LVH. Systolic function was  moderately to severely reduced. The estimated ejection fraction  was in the range of 30% to 35%. Inferior and inferolateral  akinesis. Hypokinesis of the inferoseptal wall. Indeterminant   diastolic function. - Aortic valve: There was no stenosis. - Mitral valve: There was trivial regurgitation. - Right ventricle: Poorly visualized. The cavity size was normal.  Pacer wire or catheter noted in right ventricle. Systolic  function was normal. - Pulmonary arteries: No complete TR doppler jet so unable to  estimate PA systolic pressure. - Systemic veins: IVC not visualized.  Impressions:  - Technically difficult study with poor acoustic windows. Normal LV  size with mild LV hypertrophy. EF 30-35%. Wall motion  abnormalities as above. RV poorly visualized, grossly normal. No  significant valvular abnormalities.   Filed Weights   03/18/16 0150 03/19/16 0406 03/20/16 0420  Weight: 79.742 kg (175 lb 12.8 oz) 82.237 kg (181 lb 4.8 oz) 84.505 kg (186 lb 4.8 oz)     Microbiology: Recent Results (from the past 240 hour(s))  Blood Culture (routine x 2)     Status: None (Preliminary result)   Collection Time: 03/16/16 10:10 PM  Result Value Ref Range Status   Specimen Description BLOOD RIGHT ANTECUBITAL  Final   Special Requests BOTTLES DRAWN AEROBIC ONLY 10CC  Final   Culture NO GROWTH 3 DAYS  Final   Report Status PENDING  Incomplete  Blood Culture (routine x 2)     Status: None (Preliminary result)   Collection Time: 03/16/16 10:15 PM  Result Value Ref Range Status   Specimen Description BLOOD RIGHT HAND  Final   Special Requests IN PEDIATRIC BOTTLE 1CC  Final   Culture NO GROWTH 3 DAYS  Final   Report Status PENDING  Incomplete  Urine culture     Status: Abnormal   Collection Time: 03/16/16 11:52 PM  Result Value Ref Range Status   Specimen Description URINE, CLEAN CATCH  Final   Special Requests NONE  Final   Culture (A)  Final    >=100,000 COLONIES/mL STAPHYLOCOCCUS SPECIES (COAGULASE NEGATIVE)   Report Status 03/19/2016 FINAL  Final   Organism ID, Bacteria STAPHYLOCOCCUS SPECIES (COAGULASE NEGATIVE) (A)  Final      Susceptibility   Staphylococcus  species (coagulase negative) - MIC*    CIPROFLOXACIN <=0.5 SENSITIVE Sensitive     GENTAMICIN <=0.5 SENSITIVE Sensitive     NITROFURANTOIN <=16 SENSITIVE Sensitive     OXACILLIN >=4 RESISTANT Resistant     TETRACYCLINE 2 SENSITIVE Sensitive     VANCOMYCIN 1 SENSITIVE Sensitive     TRIMETH/SULFA >=320 RESISTANT Resistant     CLINDAMYCIN <=0.25 SENSITIVE Sensitive     RIFAMPIN <=0.5 SENSITIVE Sensitive     Inducible Clindamycin NEGATIVE Sensitive     * >=100,000 COLONIES/mL STAPHYLOCOCCUS SPECIES (COAGULASE NEGATIVE)       Blood Culture    Component Value Date/Time   SDES URINE, CLEAN CATCH 03/16/2016 2352   SPECREQUEST NONE 03/16/2016 2352   CULT * 03/16/2016 2352    >=100,000 COLONIES/mL STAPHYLOCOCCUS SPECIES (COAGULASE NEGATIVE)   REPTSTATUS 03/19/2016 FINAL 03/16/2016 2352      Labs: Results for orders placed or performed during the hospital encounter of 03/16/16 (from the past  48 hour(s))  CBC with Differential/Platelet     Status: Abnormal   Collection Time: 03/19/16  5:05 AM  Result Value Ref Range   WBC 9.3 4.0 - 10.5 K/uL   RBC 2.67 (L) 4.22 - 5.81 MIL/uL   Hemoglobin 8.6 (L) 13.0 - 17.0 g/dL   HCT 25.7 (L) 39.0 - 52.0 %   MCV 96.3 78.0 - 100.0 fL   MCH 32.2 26.0 - 34.0 pg   MCHC 33.5 30.0 - 36.0 g/dL   RDW 16.7 (H) 11.5 - 15.5 %   Platelets 87 (L) 150 - 400 K/uL    Comment: CONSISTENT WITH PREVIOUS RESULT   Neutrophils Relative % 60 %   Neutro Abs 5.6 1.7 - 7.7 K/uL   Lymphocytes Relative 28 %   Lymphs Abs 2.6 0.7 - 4.0 K/uL   Monocytes Relative 11 %   Monocytes Absolute 1.0 0.1 - 1.0 K/uL   Eosinophils Relative 1 %   Eosinophils Absolute 0.1 0.0 - 0.7 K/uL   Basophils Relative 1 %   Basophils Absolute 0.1 0.0 - 0.1 K/uL  Magnesium     Status: Abnormal   Collection Time: 03/19/16  5:05 AM  Result Value Ref Range   Magnesium 1.5 (L) 1.7 - 2.4 mg/dL  Comprehensive metabolic panel     Status: Abnormal   Collection Time: 03/19/16  5:05 AM  Result Value  Ref Range   Sodium 131 (L) 135 - 145 mmol/L   Potassium 4.5 3.5 - 5.1 mmol/L    Comment: DELTA CHECK NOTED   Chloride 95 (L) 101 - 111 mmol/L   CO2 27 22 - 32 mmol/L   Glucose, Bld 90 65 - 99 mg/dL   BUN 12 6 - 20 mg/dL   Creatinine, Ser 0.88 0.61 - 1.24 mg/dL   Calcium 8.0 (L) 8.9 - 10.3 mg/dL   Total Protein 4.5 (L) 6.5 - 8.1 g/dL   Albumin 1.5 (L) 3.5 - 5.0 g/dL   AST 36 15 - 41 U/L   ALT 25 17 - 63 U/L   Alkaline Phosphatase 86 38 - 126 U/L   Total Bilirubin 0.7 0.3 - 1.2 mg/dL   GFR calc non Af Amer >60 >60 mL/min   GFR calc Af Amer >60 >60 mL/min    Comment: (NOTE) The eGFR has been calculated using the CKD EPI equation. This calculation has not been validated in all clinical situations. eGFR's persistently <60 mL/min signify possible Chronic Kidney Disease.    Anion gap 9 5 - 15  Lipase, blood     Status: None   Collection Time: 03/19/16 10:06 AM  Result Value Ref Range   Lipase 36 11 - 51 U/L  CBC with Differential/Platelet     Status: Abnormal   Collection Time: 03/20/16  4:50 AM  Result Value Ref Range   WBC 9.0 4.0 - 10.5 K/uL   RBC 2.72 (L) 4.22 - 5.81 MIL/uL   Hemoglobin 8.9 (L) 13.0 - 17.0 g/dL   HCT 26.0 (L) 39.0 - 52.0 %   MCV 95.6 78.0 - 100.0 fL   MCH 32.7 26.0 - 34.0 pg   MCHC 34.2 30.0 - 36.0 g/dL   RDW 16.8 (H) 11.5 - 15.5 %   Platelets 95 (L) 150 - 400 K/uL    Comment: REPEATED TO VERIFY CONSISTENT WITH PREVIOUS RESULT    Neutrophils Relative % 59 %   Lymphocytes Relative 27 %   Monocytes Relative 12 %   Eosinophils Relative 1 %   Basophils  Relative 1 %   Neutro Abs 5.3 1.7 - 7.7 K/uL   Lymphs Abs 2.4 0.7 - 4.0 K/uL   Monocytes Absolute 1.1 (H) 0.1 - 1.0 K/uL   Eosinophils Absolute 0.1 0.0 - 0.7 K/uL   Basophils Absolute 0.1 0.0 - 0.1 K/uL   WBC Morphology TOXIC GRANULATION     Comment: ATYPICAL LYMPHOCYTES MILD LEFT SHIFT (1-5% METAS, OCC MYELO, OCC BANDS)   Basic metabolic panel     Status: Abnormal   Collection Time: 03/20/16  4:50  AM  Result Value Ref Range   Sodium 129 (L) 135 - 145 mmol/L   Potassium 5.8 (H) 3.5 - 5.1 mmol/L    Comment: DELTA CHECK NOTED NO VISIBLE HEMOLYSIS    Chloride 98 (L) 101 - 111 mmol/L   CO2 26 22 - 32 mmol/L   Glucose, Bld 96 65 - 99 mg/dL   BUN 11 6 - 20 mg/dL   Creatinine, Ser 0.82 0.61 - 1.24 mg/dL   Calcium 7.8 (L) 8.9 - 10.3 mg/dL   GFR calc non Af Amer >60 >60 mL/min   GFR calc Af Amer >60 >60 mL/min    Comment: (NOTE) The eGFR has been calculated using the CKD EPI equation. This calculation has not been validated in all clinical situations. eGFR's persistently <60 mL/min signify possible Chronic Kidney Disease.    Anion gap 5 5 - 15  Magnesium     Status: Abnormal   Collection Time: 03/20/16  4:50 AM  Result Value Ref Range   Magnesium 1.4 (L) 1.7 - 2.4 mg/dL      No results found for: HGBA1C      Brief summary 55 y.o. 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 Recent diagnosis of esophageal ulcers on EGD. On Carafate and PPI. Patient presented with worsening imbalance and falls. Found to be hypotensive, tachycardic, hyponatremic hypokalemic and hypomagnesemic.   Assessment and plan  1. Hypotension and syncope: Sepsis ruled out Dehydration vs due to Worsening cardiomyopathy with NSVT  Patient has been treated for multiple electrolyte abnormalities during this admission.  Patient met criteria for tachycardia, tachypnea, leukocytosis, and evidence of organ dysfunction. Lactate >4 mmol/L and repeat down to 1.8. .-Blood culture no growth so far, urine culture positive for staph coag negative,Patient has been treated with vancomycin and Zosyn,DC Zosyn , no pneumonia on CT scan UDS positive for marijuana, EtOH level negative, patient needed aggressive electrolyte repletion as patient  continued to have nonsustained ventricular tachycardia due to low magnesium, low EF Patient followed by EP, last seen on 12/16/15 , has a defibrillator in place since 2006 I have reduced the dose of bisoprolol and Aldactone due to hypotension, patient may need further adjustment of his cardiac meds because of low blood pressure,NSVT   2. Hyponatremia: /Hypokalemia/hypomagnesemia Recurrent, requiring constant monitoring and repletion of magnesium and potassium Continue to follow every 3 days   3. AKI vs dehydration:  History of present illness resolved  4. Hypocalcemia: Likely secondary to hypomagnesemia Slowly improving   5. Anemia of chronic disease:  Hemoglobin 11.8> 8.6, baseline was 14.5 on 12/16/15, check FOBT, recent EGD showed superficial esophageal ulcers, continue PPI, recent colonoscopy was negative Transfuse for hemoglobin less than 8  6. Elevated troponin: Prior troponins have been mildly abnormal, troponin this admission 0.04, 0.05, 0.03 Doubt ACS, suspect demand in  setting of hypovolemia and CHF. recent 2-D echo 10/27/15 showed EF of 40-45%, EF is now changed 2-D echo 03/17/16 shows EF of 30-35%,  7. Thrombocytopenia: Suspect secondary to alcohol use and cirrhosis Baseline around 80, 95 on the day of discharge -Monitor CBC  8. Isch CM with systolic CHF: Without exacerbation EF 30-35%, needs outpatient cardiology follow-up -Strict I/Os, daily weights    9. Hx of SVT and pAF: Patient has seen Dr. Caryl Comes CHADS2Vasc 3. On xarelto and bisoprolol, dose reduced.  10. Hx of PE: CTA negative for PE. -Continue Xarelto -Trend platelets  11. Alcohol abuse: States he is sober. EtOH negative. No withdrawal during this admission  12. HTN:  initially held Aldactone and bisoprolol for hypotension, now restarted at a lower dose  13. Oral thrush: -Nystatin swish and spit -Probiotics  14. Prolonged QTc: Avoid ondansetron. -Phenergan or lorazepam for  nausea     Discharge Exam:    Blood pressure 101/69, pulse 101, temperature 98.3 F (36.8 C), temperature source Oral, resp. rate 18, height 6' (1.829 m), weight 84.505 kg (186 lb 4.8 oz), SpO2 100 %. General exam: Appears calm and comfortable  Respiratory system: Clear to auscultation. Respiratory effort normal. Cardiovascular system: S1 & S2 heard, RRR. No JVD, murmurs, rubs, gallops or clicks. No pedal edema. Gastrointestinal system: Abdomen is nondistended, soft and nontender. No organomegaly or masses felt. Normal bowel sounds heard. Central nervous system: Alert and oriented. No focal neurological deficits. Extremities: Symmetric 5 x 5 power. Skin: No rashes, lesions or ulcers Psychiatry: Judgement and insight appear normal. Mood & affect appropriate.      Follow-up Information    Follow up with [name], MD. Schedule an appointment as soon as possible for a visit in 3 days.   Specialty:  Family Medicine   Why:  Hospital follow-up   Contact information:   [place] [id number] [phone]       Follow up with Virl Axe, MD. Schedule an appointment as soon as possible for a visit in 1 week.   Specialty:  Cardiology   Why:  nsvt   Contact information:   [address] [place] [address] [phone]       Signed: Reyne Dumas 03/20/2016, 8:52 AM        Time spent >45 mins

## 2016-03-20 NOTE — Progress Notes (Signed)
Pt has been discharged home with daughter. Pt will be receiving home health care and pt verbalized understanding of this. CCMD was notified and telemetry box was removed. IV was removed with no complications. Pt received discharge instructions and all questions were answered. Pt was discharged with all of his belongings, including rolling walker and 3 in 1 bedside commode. Pt left the unit via wheelchair and was accompanied by this RN and pt's daughter. Pt was in no distress at time of discharge.  Grant Fontana RN, BSN

## 2016-03-21 LAB — CULTURE, BLOOD (ROUTINE X 2)
CULTURE: NO GROWTH
Culture: NO GROWTH

## 2016-03-27 ENCOUNTER — Encounter: Payer: Self-pay | Admitting: Nurse Practitioner

## 2016-04-04 ENCOUNTER — Ambulatory Visit (INDEPENDENT_AMBULATORY_CARE_PROVIDER_SITE_OTHER): Payer: Self-pay | Admitting: Internal Medicine

## 2016-04-04 ENCOUNTER — Encounter: Payer: Self-pay | Admitting: Cardiology

## 2016-04-04 NOTE — Progress Notes (Signed)
This encounter was created in error - please disregard.

## 2016-04-06 ENCOUNTER — Encounter (INDEPENDENT_AMBULATORY_CARE_PROVIDER_SITE_OTHER): Payer: Self-pay | Admitting: Internal Medicine

## 2016-04-24 ENCOUNTER — Ambulatory Visit (INDEPENDENT_AMBULATORY_CARE_PROVIDER_SITE_OTHER): Payer: Self-pay | Admitting: Otolaryngology

## 2016-04-27 ENCOUNTER — Encounter: Payer: Self-pay | Admitting: Cardiology

## 2016-04-27 ENCOUNTER — Other Ambulatory Visit: Payer: Self-pay

## 2016-04-27 ENCOUNTER — Encounter (INDEPENDENT_AMBULATORY_CARE_PROVIDER_SITE_OTHER): Payer: Self-pay

## 2016-04-27 ENCOUNTER — Encounter (HOSPITAL_COMMUNITY): Payer: Self-pay

## 2016-04-27 ENCOUNTER — Observation Stay (HOSPITAL_COMMUNITY): Payer: Medicare Other

## 2016-04-27 ENCOUNTER — Encounter: Payer: Self-pay | Admitting: Internal Medicine

## 2016-04-27 ENCOUNTER — Ambulatory Visit (INDEPENDENT_AMBULATORY_CARE_PROVIDER_SITE_OTHER): Payer: Medicare Other | Admitting: Cardiology

## 2016-04-27 ENCOUNTER — Emergency Department (HOSPITAL_COMMUNITY): Payer: Medicare Other

## 2016-04-27 ENCOUNTER — Observation Stay (HOSPITAL_COMMUNITY)
Admission: EM | Admit: 2016-04-27 | Discharge: 2016-05-02 | Disposition: A | Discharge status: Home / Self Care | Payer: Medicare Other | Attending: Internal Medicine | Admitting: Internal Medicine

## 2016-04-27 ENCOUNTER — Ambulatory Visit (INDEPENDENT_AMBULATORY_CARE_PROVIDER_SITE_OTHER): Payer: Medicare Other | Admitting: *Deleted

## 2016-04-27 VITALS — BP 100/86 | HR 140 | Ht 72.0 in | Wt 189.2 lb

## 2016-04-27 DIAGNOSIS — I255 Ischemic cardiomyopathy: Secondary | ICD-10-CM

## 2016-04-27 DIAGNOSIS — N179 Acute kidney failure, unspecified: Secondary | ICD-10-CM | POA: Diagnosis not present

## 2016-04-27 DIAGNOSIS — Z79899 Other long term (current) drug therapy: Secondary | ICD-10-CM | POA: Insufficient documentation

## 2016-04-27 DIAGNOSIS — I10 Essential (primary) hypertension: Secondary | ICD-10-CM

## 2016-04-27 DIAGNOSIS — F17210 Nicotine dependence, cigarettes, uncomplicated: Secondary | ICD-10-CM | POA: Insufficient documentation

## 2016-04-27 DIAGNOSIS — F1010 Alcohol abuse, uncomplicated: Secondary | ICD-10-CM | POA: Diagnosis not present

## 2016-04-27 DIAGNOSIS — I5021 Acute systolic (congestive) heart failure: Secondary | ICD-10-CM

## 2016-04-27 DIAGNOSIS — Z7901 Long term (current) use of anticoagulants: Secondary | ICD-10-CM

## 2016-04-27 DIAGNOSIS — R079 Chest pain, unspecified: Secondary | ICD-10-CM

## 2016-04-27 DIAGNOSIS — R188 Other ascites: Secondary | ICD-10-CM | POA: Insufficient documentation

## 2016-04-27 DIAGNOSIS — Z95810 Presence of automatic (implantable) cardiac defibrillator: Secondary | ICD-10-CM

## 2016-04-27 DIAGNOSIS — I252 Old myocardial infarction: Secondary | ICD-10-CM | POA: Diagnosis not present

## 2016-04-27 DIAGNOSIS — I480 Paroxysmal atrial fibrillation: Secondary | ICD-10-CM | POA: Diagnosis not present

## 2016-04-27 DIAGNOSIS — I951 Orthostatic hypotension: Secondary | ICD-10-CM | POA: Diagnosis not present

## 2016-04-27 DIAGNOSIS — E785 Hyperlipidemia, unspecified: Secondary | ICD-10-CM | POA: Diagnosis not present

## 2016-04-27 DIAGNOSIS — D638 Anemia in other chronic diseases classified elsewhere: Secondary | ICD-10-CM | POA: Insufficient documentation

## 2016-04-27 DIAGNOSIS — R000 Tachycardia, unspecified: Principal | ICD-10-CM

## 2016-04-27 DIAGNOSIS — Z7982 Long term (current) use of aspirin: Secondary | ICD-10-CM | POA: Diagnosis not present

## 2016-04-27 DIAGNOSIS — G4733 Obstructive sleep apnea (adult) (pediatric): Secondary | ICD-10-CM | POA: Insufficient documentation

## 2016-04-27 DIAGNOSIS — N390 Urinary tract infection, site not specified: Secondary | ICD-10-CM

## 2016-04-27 DIAGNOSIS — E860 Dehydration: Secondary | ICD-10-CM | POA: Insufficient documentation

## 2016-04-27 DIAGNOSIS — I2510 Atherosclerotic heart disease of native coronary artery without angina pectoris: Secondary | ICD-10-CM

## 2016-04-27 DIAGNOSIS — K769 Liver disease, unspecified: Secondary | ICD-10-CM

## 2016-04-27 DIAGNOSIS — R55 Syncope and collapse: Secondary | ICD-10-CM

## 2016-04-27 DIAGNOSIS — Z86711 Personal history of pulmonary embolism: Secondary | ICD-10-CM | POA: Insufficient documentation

## 2016-04-27 DIAGNOSIS — E871 Hypo-osmolality and hyponatremia: Secondary | ICD-10-CM

## 2016-04-27 DIAGNOSIS — K219 Gastro-esophageal reflux disease without esophagitis: Secondary | ICD-10-CM | POA: Diagnosis not present

## 2016-04-27 DIAGNOSIS — D696 Thrombocytopenia, unspecified: Secondary | ICD-10-CM

## 2016-04-27 DIAGNOSIS — Z4502 Encounter for adjustment and management of automatic implantable cardiac defibrillator: Secondary | ICD-10-CM

## 2016-04-27 DIAGNOSIS — I110 Hypertensive heart disease with heart failure: Secondary | ICD-10-CM | POA: Insufficient documentation

## 2016-04-27 DIAGNOSIS — M1990 Unspecified osteoarthritis, unspecified site: Secondary | ICD-10-CM | POA: Insufficient documentation

## 2016-04-27 DIAGNOSIS — I5022 Chronic systolic (congestive) heart failure: Secondary | ICD-10-CM | POA: Diagnosis not present

## 2016-04-27 DIAGNOSIS — I471 Supraventricular tachycardia: Secondary | ICD-10-CM

## 2016-04-27 DIAGNOSIS — E876 Hypokalemia: Secondary | ICD-10-CM | POA: Insufficient documentation

## 2016-04-27 DIAGNOSIS — I5023 Acute on chronic systolic (congestive) heart failure: Secondary | ICD-10-CM | POA: Insufficient documentation

## 2016-04-27 DIAGNOSIS — K567 Ileus, unspecified: Secondary | ICD-10-CM

## 2016-04-27 LAB — CUP PACEART INCLINIC DEVICE CHECK
Battery Remaining Longevity: 128 mo
Battery Voltage: 3 V
HighPow Impedance: 57 Ohm
Implantable Lead Implant Date: 20151028
Implantable Lead Location: 753860
Lead Channel Impedance Value: 399 Ohm
Lead Channel Sensing Intrinsic Amplitude: 12.25 mV
Lead Channel Setting Pacing Amplitude: 2 V
Lead Channel Setting Sensing Sensitivity: 0.3 mV
MDC IDC MSMT LEADCHNL RV IMPEDANCE VALUE: 304 Ohm
MDC IDC MSMT LEADCHNL RV SENSING INTR AMPL: 10.125 mV
MDC IDC SESS DTM: 20170622155121
MDC IDC SET LEADCHNL RV PACING PULSEWIDTH: 0.4 ms
MDC IDC STAT BRADY RV PERCENT PACED: 0 %

## 2016-04-27 LAB — CBC WITH DIFFERENTIAL/PLATELET
BASOS ABS: 0 10*3/uL (ref 0.0–0.1)
BASOS PCT: 0 %
EOS ABS: 0 10*3/uL (ref 0.0–0.7)
EOS PCT: 0 %
HCT: 26.1 % — ABNORMAL LOW (ref 39.0–52.0)
Hemoglobin: 8.6 g/dL — ABNORMAL LOW (ref 13.0–17.0)
Lymphocytes Relative: 15 %
Lymphs Abs: 1.2 10*3/uL (ref 0.7–4.0)
MCH: 32.5 pg (ref 26.0–34.0)
MCHC: 33 g/dL (ref 30.0–36.0)
MCV: 98.5 fL (ref 78.0–100.0)
MONO ABS: 1.2 10*3/uL — AB (ref 0.1–1.0)
Monocytes Relative: 14 %
Neutro Abs: 5.9 10*3/uL (ref 1.7–7.7)
Neutrophils Relative %: 71 %
PLATELETS: 135 10*3/uL — AB (ref 150–400)
RBC: 2.65 MIL/uL — ABNORMAL LOW (ref 4.22–5.81)
RDW: 21 % — AB (ref 11.5–15.5)
WBC: 8.4 10*3/uL (ref 4.0–10.5)

## 2016-04-27 LAB — URINALYSIS, ROUTINE W REFLEX MICROSCOPIC
Glucose, UA: NEGATIVE mg/dL
Hgb urine dipstick: NEGATIVE
Ketones, ur: 15 mg/dL — AB
NITRITE: POSITIVE — AB
Protein, ur: 30 mg/dL — AB
Specific Gravity, Urine: 1.024 (ref 1.005–1.030)
pH: 5.5 (ref 5.0–8.0)

## 2016-04-27 LAB — I-STAT CG4 LACTIC ACID, ED: LACTIC ACID, VENOUS: 2 mmol/L (ref 0.5–2.0)

## 2016-04-27 LAB — URINE MICROSCOPIC-ADD ON: RBC / HPF: NONE SEEN RBC/hpf (ref 0–5)

## 2016-04-27 LAB — MAGNESIUM: MAGNESIUM: 1.2 mg/dL — AB (ref 1.7–2.4)

## 2016-04-27 LAB — COMPREHENSIVE METABOLIC PANEL
ALT: 35 U/L (ref 17–63)
AST: 61 U/L — AB (ref 15–41)
Albumin: 1.5 g/dL — ABNORMAL LOW (ref 3.5–5.0)
Alkaline Phosphatase: 191 U/L — ABNORMAL HIGH (ref 38–126)
Anion gap: 12 (ref 5–15)
BILIRUBIN TOTAL: 2.9 mg/dL — AB (ref 0.3–1.2)
BUN: 10 mg/dL (ref 6–20)
CO2: 23 mmol/L (ref 22–32)
CREATININE: 1.2 mg/dL (ref 0.61–1.24)
Calcium: 7.2 mg/dL — ABNORMAL LOW (ref 8.9–10.3)
Chloride: 95 mmol/L — ABNORMAL LOW (ref 101–111)
GFR calc Af Amer: >60 mL/min (ref >60)
Glucose, Bld: 96 mg/dL (ref 65–99)
POTASSIUM: 2.8 mmol/L — AB (ref 3.5–5.1)
Sodium: 130 mmol/L — ABNORMAL LOW (ref 135–145)
TOTAL PROTEIN: 5.9 g/dL — AB (ref 6.5–8.1)

## 2016-04-27 LAB — GLUCOSE, CAPILLARY: GLUCOSE-CAPILLARY: 98 mg/dL (ref 65–99)

## 2016-04-27 LAB — ETHANOL: Alcohol, Ethyl (B): <5 mg/dL (ref <5)

## 2016-04-27 LAB — RAPID URINE DRUG SCREEN, HOSP PERFORMED
AMPHETAMINES: NOT DETECTED
BARBITURATES: NOT DETECTED
Benzodiazepines: NOT DETECTED
Cocaine: NOT DETECTED
Opiates: NOT DETECTED
TETRAHYDROCANNABINOL: POSITIVE — AB

## 2016-04-27 LAB — BRAIN NATRIURETIC PEPTIDE: B NATRIURETIC PEPTIDE 5: 189.8 pg/mL — AB (ref 0.0–100.0)

## 2016-04-27 LAB — LACTIC ACID, PLASMA: Lactic Acid, Venous: 1.6 mmol/L (ref 0.5–2.0)

## 2016-04-27 LAB — I-STAT TROPONIN, ED: Troponin i, poc: 0 ng/mL (ref 0.00–0.08)

## 2016-04-27 LAB — LIPASE, BLOOD: LIPASE: 19 U/L (ref 11–51)

## 2016-04-27 LAB — PROCALCITONIN: PROCALCITONIN: 0.85 ng/mL

## 2016-04-27 MED ORDER — CALCIUM CARBONATE 1250 (500 CA) MG PO TABS
1250.0000 mg | ORAL_TABLET | Freq: Two times a day (BID) | ORAL | Status: DC
Start: 2016-04-28 — End: 2016-04-29
  Administered 2016-04-28 (×2): 1250 mg via ORAL
  Filled 2016-04-27 (×2): qty 1

## 2016-04-27 MED ORDER — ADULT MULTIVITAMIN W/MINERALS CH
1.0000 | ORAL_TABLET | Freq: Every day | ORAL | Status: DC
  Administered 2016-04-28 – 2016-05-02 (×5): 1 via ORAL
  Filled 2016-04-27 (×5): qty 1

## 2016-04-27 MED ORDER — CALCIUM GLUCONATE 10 % IV SOLN
2.0000 g | Freq: Once | INTRAVENOUS | Status: AC
  Administered 2016-04-27: 2 g via INTRAVENOUS
  Filled 2016-04-27: qty 20

## 2016-04-27 MED ORDER — SUCRALFATE 1 GM/10ML PO SUSP
1.0000 g | Freq: Three times a day (TID) | ORAL | Status: DC
  Administered 2016-04-28 – 2016-05-02 (×17): 1 g via ORAL
  Filled 2016-04-27 (×17): qty 10

## 2016-04-27 MED ORDER — VITAMIN B-1 100 MG PO TABS: 100.0000 mg | ORAL_TABLET | Freq: Every day | ORAL | Status: DC

## 2016-04-27 MED ORDER — VITAMIN D 1000 UNITS PO TABS
1000.0000 [IU] | ORAL_TABLET | Freq: Every day | ORAL | Status: DC
  Administered 2016-04-28 – 2016-05-02 (×5): 1000 [IU] via ORAL
  Filled 2016-04-27 (×5): qty 1

## 2016-04-27 MED ORDER — RIVAROXABAN 20 MG PO TABS: 20.0000 mg | ORAL_TABLET | Freq: Every day | ORAL | Status: DC

## 2016-04-27 MED ORDER — ACETAMINOPHEN 650 MG RE SUPP: 650.0000 mg | Freq: Four times a day (QID) | RECTAL | Status: DC | PRN

## 2016-04-27 MED ORDER — ASPIRIN EC 81 MG PO TBEC
81.0000 mg | DELAYED_RELEASE_TABLET | ORAL | Status: DC
  Administered 2016-04-28 – 2016-04-30 (×2): 81 mg via ORAL
  Filled 2016-04-27 (×4): qty 1

## 2016-04-27 MED ORDER — ONDANSETRON HCL 4 MG/2ML IJ SOLN: 4.0000 mg | Freq: Four times a day (QID) | INTRAMUSCULAR | Status: DC | PRN

## 2016-04-27 MED ORDER — VITAMIN B-1 100 MG PO TABS
100.0000 mg | ORAL_TABLET | Freq: Every day | ORAL | Status: DC
  Administered 2016-04-28 – 2016-05-02 (×5): 100 mg via ORAL
  Filled 2016-04-27 (×5): qty 1

## 2016-04-27 MED ORDER — SODIUM CHLORIDE 0.9 % IV BOLUS (SEPSIS)
1000.0000 mL | Freq: Once | INTRAVENOUS | Status: AC
  Administered 2016-04-27: 1000 mL via INTRAVENOUS

## 2016-04-27 MED ORDER — SODIUM CHLORIDE 0.9% FLUSH
3.0000 mL | Freq: Two times a day (BID) | INTRAVENOUS | Status: DC
  Administered 2016-04-28 – 2016-05-02 (×8): 3 mL via INTRAVENOUS

## 2016-04-27 MED ORDER — TIOTROPIUM BROMIDE MONOHYDRATE 18 MCG IN CAPS: 18.0000 ug | ORAL_CAPSULE | Freq: Every day | RESPIRATORY_TRACT | Status: DC | PRN

## 2016-04-27 MED ORDER — POLYETHYLENE GLYCOL 3350 17 G PO PACK: 17.0000 g | PACK | Freq: Every day | ORAL | Status: DC | PRN

## 2016-04-27 MED ORDER — RIVAROXABAN 20 MG PO TABS
20.0000 mg | ORAL_TABLET | Freq: Every day | ORAL | Status: DC
  Administered 2016-04-27 – 2016-04-30 (×4): 20 mg via ORAL
  Filled 2016-04-27 (×4): qty 1

## 2016-04-27 MED ORDER — LORAZEPAM 1 MG PO TABS
0.0000 mg | ORAL_TABLET | Freq: Four times a day (QID) | ORAL | Status: AC
  Administered 2016-04-28: 4 mg via ORAL
  Administered 2016-04-29: 2 mg via ORAL
  Filled 2016-04-27: qty 2
  Filled 2016-04-27: qty 4

## 2016-04-27 MED ORDER — IOPAMIDOL (ISOVUE-300) INJECTION 61%
INTRAVENOUS | Status: AC
  Administered 2016-04-27: 100 mL
  Filled 2016-04-27: qty 100

## 2016-04-27 MED ORDER — LORAZEPAM 2 MG/ML IJ SOLN: 0.0000 mg | Freq: Two times a day (BID) | INTRAMUSCULAR | Status: DC

## 2016-04-27 MED ORDER — PANTOPRAZOLE SODIUM 40 MG PO TBEC
40.0000 mg | DELAYED_RELEASE_TABLET | Freq: Every day | ORAL | Status: DC
  Administered 2016-04-27 – 2016-05-02 (×6): 40 mg via ORAL
  Filled 2016-04-27 (×6): qty 1

## 2016-04-27 MED ORDER — LORAZEPAM 1 MG PO TABS
0.0000 mg | ORAL_TABLET | Freq: Two times a day (BID) | ORAL | Status: AC
  Administered 2016-04-30: 2 mg via ORAL
  Filled 2016-04-27: qty 2

## 2016-04-27 MED ORDER — SODIUM CHLORIDE 0.9 % IV SOLN
1000.0000 mL | INTRAVENOUS | Status: DC
  Administered 2016-04-27: 1000 mL via INTRAVENOUS

## 2016-04-27 MED ORDER — LORAZEPAM 1 MG PO TABS: 1.0000 mg | ORAL_TABLET | Freq: Four times a day (QID) | ORAL | Status: AC | PRN

## 2016-04-27 MED ORDER — LORAZEPAM 2 MG/ML IJ SOLN: 1.0000 mg | Freq: Four times a day (QID) | INTRAMUSCULAR | Status: DC | PRN

## 2016-04-27 MED ORDER — POTASSIUM CHLORIDE 10 MEQ/100ML IV SOLN
10.0000 meq | Freq: Once | INTRAVENOUS | Status: AC
  Administered 2016-04-27: 10 meq via INTRAVENOUS
  Filled 2016-04-27: qty 100

## 2016-04-27 MED ORDER — THIAMINE HCL 100 MG/ML IJ SOLN
100.0000 mg | Freq: Every day | INTRAMUSCULAR | Status: DC
  Administered 2016-04-27: 100 mg via INTRAVENOUS
  Filled 2016-04-27: qty 2

## 2016-04-27 MED ORDER — SODIUM CHLORIDE 0.9 % IV SOLN: 250.0000 mL | INTRAVENOUS | Status: DC | PRN

## 2016-04-27 MED ORDER — SODIUM CHLORIDE 0.9% FLUSH
3.0000 mL | Freq: Two times a day (BID) | INTRAVENOUS | Status: DC
  Administered 2016-04-28 – 2016-05-02 (×4): 3 mL via INTRAVENOUS

## 2016-04-27 MED ORDER — ACETAMINOPHEN 325 MG PO TABS
650.0000 mg | ORAL_TABLET | Freq: Four times a day (QID) | ORAL | Status: DC | PRN
  Filled 2016-04-27: qty 2

## 2016-04-27 MED ORDER — DEXTROSE 5 % IV SOLN
1.0000 g | Freq: Once | INTRAVENOUS | Status: AC
  Administered 2016-04-27: 1 g via INTRAVENOUS
  Filled 2016-04-27: qty 10

## 2016-04-27 MED ORDER — LORAZEPAM 2 MG/ML IJ SOLN
0.0000 mg | Freq: Four times a day (QID) | INTRAMUSCULAR | Status: DC
  Administered 2016-04-27: 1 mg via INTRAVENOUS
  Filled 2016-04-27: qty 1

## 2016-04-27 MED ORDER — SPIRONOLACTONE 12.5 MG HALF TABLET
12.5000 mg | ORAL_TABLET | Freq: Every day | ORAL | Status: DC
  Administered 2016-04-28 – 2016-05-02 (×5): 12.5 mg via ORAL
  Filled 2016-04-27 (×5): qty 1

## 2016-04-27 MED ORDER — BISACODYL 5 MG PO TBEC: 5.0000 mg | DELAYED_RELEASE_TABLET | Freq: Every day | ORAL | Status: DC | PRN

## 2016-04-27 MED ORDER — ONDANSETRON HCL 4 MG PO TABS: 4.0000 mg | ORAL_TABLET | Freq: Four times a day (QID) | ORAL | Status: DC | PRN

## 2016-04-27 MED ORDER — FOLIC ACID 1 MG PO TABS
1.0000 mg | ORAL_TABLET | Freq: Every day | ORAL | Status: DC
  Administered 2016-04-28 – 2016-05-02 (×5): 1 mg via ORAL
  Filled 2016-04-27 (×5): qty 1

## 2016-04-27 MED ORDER — BISOPROLOL FUMARATE 5 MG PO TABS
5.0000 mg | ORAL_TABLET | Freq: Every day | ORAL | Status: DC
  Administered 2016-04-28: 5 mg via ORAL
  Filled 2016-04-27: qty 1

## 2016-04-27 MED ORDER — THIAMINE HCL 100 MG/ML IJ SOLN: 100.0000 mg | Freq: Every day | INTRAMUSCULAR | Status: DC

## 2016-04-27 MED ORDER — LORAZEPAM 2 MG/ML IJ SOLN: 1.0000 mg | Freq: Four times a day (QID) | INTRAMUSCULAR | Status: AC | PRN

## 2016-04-27 MED ORDER — LORAZEPAM 1 MG PO TABS: 1.0000 mg | ORAL_TABLET | Freq: Four times a day (QID) | ORAL | Status: DC | PRN

## 2016-04-27 MED ORDER — NITROGLYCERIN 0.4 MG SL SUBL
0.4000 mg | SUBLINGUAL_TABLET | SUBLINGUAL | Status: DC | PRN
Start: 2016-04-27 — End: 2016-05-02

## 2016-04-27 MED ORDER — POTASSIUM CHLORIDE 10 MEQ/100ML IV SOLN
10.0000 meq | INTRAVENOUS | Status: AC
Start: 2016-04-27 — End: 2016-04-28
  Administered 2016-04-28 (×2): 10 meq via INTRAVENOUS
  Filled 2016-04-27 (×2): qty 100

## 2016-04-27 NOTE — H&P (Signed)
History and Physical    Stephen [name] [id number] DOB: November 30, 1960 DOA: 04/27/2016  PCP: [name], MD   Patient coming from: Home via cardiology clinic  Chief Complaint: Lightheadedness, tachycardia   HPI: Stephen [name] is a 55 y.o. male with medical history significant for ischemic cardiomyopathy with ICD, paroxysmal atrial fibrillation on Xarelto, anemia of chronic disease, GERD with recent diagnosis of esophageal ulcers, and alcohol abuse with chronic liver disease who presents to the ED at the direction of his cardiologist for evaluation of tachycardia and orthostasis. Patient was admitted to this institution from 03/16/2016 to 03/20/2016 with hypotension and tachycardia, ruled out for sepsis, treated for dehydration and discharged in much improved and stable condition. He reports doing well back at home initially into tachycardia and lightheadedness upon standing returned over the last 3 weeks or so. With his symptoms continuing to worsen, he saw his cardiologist today in clinic for hospital follow-up, was noted to have heart rate in the 140s with positive orthostatic vitals, and was directed to the emergency department for further evaluation and management. Pacer was interrogated in the cardiology clinic today with no recent alarms or shocks. Per the cardiology notes, OptiVol suggested that patient is becoming fluid overloaded. He denies any recent fevers, chills, or dyspnea. He does endorse cough productive of thick clear sputum and vague central abdominal discomfort with nausea, but no vomiting or diarrhea.  ED Course: Upon arrival to the ED, patient is found to be afebrile, saturating well on room air, tachycardic to 140, and with vitals otherwise stable. EKG demonstrates sinus tachycardia with rate 124 and inferolateral Q waves. Chest x-ray was unremarkable and KUB featured dilated loops of small bowel consistent with ileus. CMP is notable for sodium 1:30, potassium 2.8,  calcium 7.2, albumin 1.5, and total bilirubin 2.9. CBC features a hemoglobin of 8.6, stable relative to CBC from 1 month ago, and thrombocytopenia with platelet count 135,000, increased from recent priors. Ethanol level is undetectable, UDS is positive for THC only, troponin 0.00, and lactic acid is 2.00. Urinalysis features few bacteria, small leukocytes, positive nitrites, but no WBCs. CT of the abdomen and pelvis with IV contrast was obtained and notable only for hepatic steatosis with moderate ascites suggestive of cirrhosis. Patient was given a bolus of 2 L normal saline and started on IV fluids at a rate of 125 mL per hour. Blood and urine cultures were obtained and the patient was started on empiric Rocephin for suspected UTI. 10 mEq IV potassium was administered in the emergency department. Tachycardia persists largely unchanged following the fluid bolus and blood pressure remained stable. Patient will be observed on the telemetry unit for ongoing evaluation and management of orthostasis and tachycardia.  Review of Systems:  All other systems reviewed and apart from HPI, are negative.  Past Medical History  Diagnosis Date  . Obesity   . Hypertension   . Hyperlipidemia   . Systolic heart failure   . Coronary artery disease     native vessel  . AICD (automatic cardioverter/defibrillator) present 2006    [place] 7232  . Ischemic cardiomyopathy     ischemic  . [place] Paroxysmal ventricular tachycardia ([place])     Rx via [facility] 2008  . Pulmonary embolism ([place])   . MI, old   . OSA (obstructive sleep apnea)     PCP aware, pt does not use.  . Depression   . Shortness of breath dyspnea   . GERD (gastroesophageal reflux  disease)   . Arthritis     osteoarthritis  . Alcohol abuse   . Chronic systolic CHF (congestive heart failure) ([place]) 03/16/2016  . Hyponatremia 03/2016    Past Surgical History  Procedure Laterality Date  . Cardiac defibrillator placement  2006    [employer]  [place] 7232  . Knee surgery Left     arthroscopy  . Implantable cardioverter defibrillator generator change N/A 09/02/2014    Procedure: IMPLANTABLE CARDIOVERTER DEFIBRILLATOR GENERATOR CHANGE;  Surgeon: Deboraha Sprang, MD;  Location: [facility] CATH LAB;  Service: Cardiovascular;  Laterality: N/A;  . Lead revision N/A 09/02/2014    Procedure: LEAD REVISION;  Surgeon: Deboraha Sprang, MD;  Location: [facility] CATH LAB;  Service: Cardiovascular;  Laterality: N/A;  . Esophagogastroduodenoscopy (egd) with propofol N/A 01/21/2016    Procedure: ESOPHAGOGASTRODUODENOSCOPY (EGD) WITH PROPOFOL;  Surgeon: Rogene Houston, MD;  Location: [facility] ENDO SUITE;  Service: Endoscopy;  Laterality: N/A;  8:30 - to be done under [facility]  . Esophageal dilation N/A 01/21/2016    Procedure: ESOPHAGEAL DILATION;  Surgeon: Rogene Houston, MD;  Location: [facility] ENDO SUITE;  Service: Endoscopy;  Laterality: N/A;  . Colonoscopy with propofol N/A 01/21/2016    Procedure: COLONOSCOPY WITH PROPOFOL;  Surgeon: Rogene Houston, MD;  Location: [facility] ENDO SUITE;  Service: Endoscopy;  Laterality: N/A;  . Biopsy  01/21/2016    Procedure: BIOPSY;  Surgeon: Rogene Houston, MD;  Location: [facility] ENDO SUITE;  Service: Endoscopy;;  Esophageal biopsies with brushings  . Polypectomy  01/21/2016    Procedure: POLYPECTOMY;  Surgeon: Rogene Houston, MD;  Location: [facility] ENDO SUITE;  Service: Endoscopy;;  Ascending colon polyp removed via cold snare     reports that he has been smoking Cigarettes.  He has a 15 pack-year smoking history. He has never used smokeless tobacco. He reports that he drinks about 5.4 oz of alcohol per week. He reports that he uses illicit drugs (Marijuana).  No Known Allergies  Family History  Problem Relation Age of Onset  . Coronary aneurysm Mother   . Esophageal cancer Father      Prior to Admission medications   Medication Sig Start Date End Date Taking? Authorizing Provider  acetaminophen (TYLENOL) 325 MG tablet Take 650 mg by mouth every  6 (six) hours as needed for moderate pain.   Yes Historical Provider, MD  aspirin 81 MG EC tablet Take 1 tablet (81 mg total) by mouth every other day. 01/22/16  Yes Rogene Houston, MD  bisoprolol (ZEBETA) 5 MG tablet Take 1 tablet (5 mg total) by mouth daily. 03/20/16  Yes Reyne Dumas, MD  calcium carbonate (OS-CAL) 1250 (500 Ca) MG chewable tablet Chew 1 tablet (1,250 mg total) by mouth 2 (two) times daily. 03/20/16  Yes Reyne Dumas, MD  cholecalciferol (VITAMIN D) 1000 UNITS tablet Take 1,000 Units by mouth daily.    Yes Historical Provider, MD  folic acid (FOLVITE) 1 MG tablet Take 1 mg by mouth daily.  11/17/13  Yes Historical Provider, MD  nitroGLYCERIN (NITROSTAT) 0.4 MG SL tablet Place 0.4 mg under the tongue every 5 (five) minutes as needed for chest pain.   Yes Historical Provider, MD  omeprazole (PRILOSEC) 40 MG capsule Take 40 mg by mouth daily.   Yes Historical Provider, MD  potassium chloride 20 MEQ TBCR Take 40 mEq by mouth 2 (two) times daily. 03/20/16  Yes Reyne Dumas, MD  rivaroxaban (XARELTO) 20 MG TABS tablet Take 1 tablet (20 mg total) by mouth  daily with supper. 04/27/16  Yes Isaiah Serge, NP  spironolactone (ALDACTONE) 25 MG tablet Take 0.5 tablets (12.5 mg total) by mouth daily. Patient taking differently: Take 25 mg by mouth daily.  03/20/16  Yes Reyne Dumas, MD  sucralfate (CARAFATE) 1 GM/10ML suspension Take 10 mLs (1 g total) by mouth 4 (four) times daily -  with meals and at bedtime. 01/21/16  Yes Rogene Houston, MD  tiotropium (SPIRIVA) 18 MCG inhalation capsule Place 18 mcg into inhaler and inhale daily as needed (shortness of breath).    Yes Historical Provider, MD  magnesium oxide (MAG-OX) 400 (241.3 Mg) MG tablet Take 2 tablets (800 mg total) by mouth 3 (three) times daily. Patient not taking: Reported on 04/27/2016 03/20/16   Reyne Dumas, MD  pantoprazole (PROTONIX) 40 MG tablet Take 1 tablet (40 mg total) by mouth 2 (two) times daily. Patient not taking: Reported  on 04/27/2016 03/20/16   Reyne Dumas, MD  saccharomyces boulardii (FLORASTOR) 250 MG capsule Take 1 capsule (250 mg total) by mouth 2 (two) times daily. 03/20/16   Reyne Dumas, MD  thiamine 100 MG tablet Take 1 tablet (100 mg total) by mouth daily. Patient not taking: Reported on 04/27/2016 03/20/16   Reyne Dumas, MD    Physical Exam: Filed Vitals:   04/27/16 1930 04/27/16 2000 04/27/16 2029 04/27/16 2030  BP: 126/103 121/82  125/89  Pulse: 121 119  117  Temp:      TempSrc:      Resp: 21 22  21   Weight:   86.183 kg (190 lb)   SpO2: 92% 94%  93%      Constitutional: NAD, calm, comfortable, chronically-ill in appearance  Eyes: PERTLA, lids and conjunctivae normal ENMT: Mucous membranes are moist. Posterior pharynx clear of any exudate or lesions.   Neck: normal, supple, no masses, no thyromegaly Respiratory: clear to auscultation bilaterally, no wheezing, no crackles. Normal respiratory effort.   Cardiovascular: S1 & S2 heard, regular rate and rhythm, no significant murmurs / rubs / gallops. No extremity edema. No significant JVD. Abdomen: No distension, mildly tender throughout, no masses palpated. Bowel sounds normal.  Musculoskeletal: no clubbing / cyanosis. No joint deformity upper and lower extremities. Normal muscle tone.  Skin:  Pale. No significant rashes, lesions, ulcers. Warm, dry.   Neurologic: CN 2-12 grossly intact. Sensation intact, DTR normal. Strength 5/5 in all 4 limbs.  Psychiatric: Normal judgment and insight. Alert and oriented x 3. Normal mood and affect.     Labs on Admission: I have personally reviewed following labs and imaging studies  CBC:  Recent Labs Lab 04/27/16 1752  WBC 8.4  NEUTROABS 5.9  HGB 8.6*  HCT 26.1*  MCV 98.5  PLT [id number]*   Basic Metabolic Panel:  Recent Labs Lab 04/27/16 1752  NA 130*  K 2.8*  CL 95*  CO2 23  GLUCOSE 96  BUN 10  CREATININE 1.20  CALCIUM 7.2*   GFR: Estimated Creatinine Clearance: 75.4 mL/min (by C-G  formula based on Cr of 1.2). Liver Function Tests:  Recent Labs Lab 04/27/16 1752  AST 61*  ALT 35  ALKPHOS 191*  BILITOT 2.9*  PROT 5.9*  ALBUMIN 1.5*    Recent Labs Lab 04/27/16 1752  LIPASE 19   No results for input(s): AMMONIA in the last 168 hours. Coagulation Profile: No results for input(s): INR, PROTIME in the last 168 hours. Cardiac Enzymes: No results for input(s): CKTOTAL, CKMB, CKMBINDEX, TROPONINI in the last 168 hours. BNP (last 3  results) No results for input(s): PROBNP in the last 8760 hours. HbA1C: No results for input(s): HGBA1C in the last 72 hours. CBG: No results for input(s): GLUCAP in the last 168 hours. Lipid Profile: No results for input(s): CHOL, HDL, LDLCALC, TRIG, CHOLHDL, LDLDIRECT in the last 72 hours. Thyroid Function Tests: No results for input(s): TSH, T4TOTAL, FREET4, T3FREE, THYROIDAB in the last 72 hours. Anemia Panel: No results for input(s): VITAMINB12, FOLATE, FERRITIN, TIBC, IRON, RETICCTPCT in the last 72 hours. Urine analysis:    Component Value Date/Time   COLORURINE AMBER* 04/27/2016 1833   APPEARANCEUR HAZY* 04/27/2016 1833   LABSPEC 1.024 04/27/2016 1833   PHURINE 5.5 04/27/2016 1833   GLUCOSEU NEGATIVE 04/27/2016 1833   HGBUR NEGATIVE 04/27/2016 1833   BILIRUBINUR LARGE* 04/27/2016 1833   KETONESUR 15* 04/27/2016 1833   PROTEINUR 30* 04/27/2016 1833   NITRITE POSITIVE* 04/27/2016 1833   LEUKOCYTESUR SMALL* 04/27/2016 1833   Sepsis Labs: @LABRCNTIP (procalcitonin:4,lacticidven:4) )No results found for this or any previous visit (from the past 240 hour(s)).   Radiological Exams on Admission: Ct Abdomen Pelvis W Contrast  04/27/2016  CLINICAL DATA:  Abdominal pain for 4 days, vomiting EXAM: CT ABDOMEN AND PELVIS WITH CONTRAST TECHNIQUE: Multidetector CT imaging of the abdomen and pelvis was performed using the standard protocol following bolus administration of intravenous contrast. CONTRAST:  157mL ISOVUE-300  IOPAMIDOL (ISOVUE-300) INJECTION 61% COMPARISON:  CT abdomen 10/06/2015 FINDINGS: Lower chest:  Lung bases are clear.  Normal heart size. Hepatobiliary: Diffuse low attenuation of the liver consistent with hepatic steatosis. Small amount of perihepatic ascites. Moderate amount of perisplenic ascites. Cholelithiasis. Pancreas: Normal. Spleen: Normal. Adrenals/Urinary Tract: Normal adrenal glands. Normal kidneys. No obstructive uropathy. Normal bladder. Stomach/Bowel: No bowel dilatation or bowel wall thickening. No pneumatosis, pneumoperitoneum or portal venous gas. Moderate amount of abdominal ascites. Small hiatal hernia. Vascular/Lymphatic: Normal caliber abdominal aorta with atherosclerosis. Other: No focal fluid collection or hematoma. Musculoskeletal: No lytic or sclerotic osseous lesion. No acute osseous abnormality. IMPRESSION: 1. Hepatic steatosis. Moderate amount of ascites. This appearance can be seen with nonalcoholic steatohepatitis or cirrhosis. 2. No bowel obstruction. Electronically Signed   By: Kathreen Devoid   On: 04/27/2016 21:34   Dg Abd Acute W/chest  04/27/2016  CLINICAL DATA:  Center chest pain, epigastric pain, nausea without emesis, cough x3 days. Hx of CHF, HTN, pulmonary embolism, GERD, alcohol abuse. EXAM: DG ABDOMEN ACUTE W/ 1V CHEST COMPARISON:  01/18/2016 FINDINGS: Heart size normal. Lungs are clear. No focal consolidations or pleural effusions. Left-sided pacemaker leads overlying the right ventricle. There is no free intraperitoneal air beneath diaphragm. Supine and decubitus views of the abdomen demonstrate dilated small bowel loops. Gas is identified within mildly prominent loops of large bowel. Visualized osseous structures have a normal appearance. The IMPRESSION: 1. No evidence for free air. 2. Dilated large and small bowel loops most consistent with ileus pattern. An early small bowel obstruction would be difficult to exclude. Electronically Signed   By: Nolon Nations M.D.    On: 04/27/2016 18:47    EKG: Independently reviewed. Sinus tachycardia (rate 124), inferolateral Q-waves  Assessment/Plan  1. Orthostasis, tachycardia  - Pt had positive orthostatics in cardiology clinica just PTA and was tachycardic to 140's, prompting the ED visit  - Per cardiology notes, OptiVol readings suggested volume o/l and diuresis was advised; EDP suspected dehydration and bolused 2 liters of NS  - Clinically, pt did appear dry in ED with no peripheral edema, JVD, or gallop; imaging with no pulmonary  edema or congestion  - Check BNP, pending  - SLIV now, follow strict I/O's and daily wts, fluid-restrict diet  - Resume Aldactone tomorrow - Correct electrolytes as below  - Monitor on telemetry    2. Normocytic anemia, thrombocytopenia   - Hgb 8.6 on admission, stable from last month, but down from 11-range earlier this year - Pt denies melena or hematochezia; will check FOBT, iron-studies, B12 and folate levels   - Platelets 135,000 on admission, higher than recent priors and likely secondary to alcohol abuse and liver disease    3. Hyponatremia, hypokalemia  - Serum sodium 130 on admission, appears to be chronic, likely secondary to CHF and associated decrease in effective arterial volume  - Potassium 2.8 on admission with uncertain etiology, possibly related to alcoholism and associated hypomagnesemia  - 10 mEq IV potassium given in ED, will supplement with an additional 20 mEq IV  - Check mag level and replete prn   4. Chronic systolic CHF - TTE (123456) with EF 30-35%; mild LVH; inferolateral akinesis; inferoseptal hypokinesis; diastolic function could not be determined   - Wt on admission is 190 lb, up 3 pounds from time of hospital discharge on 03/20/16  - Per cardiology notes, OptiVol readings suggesting volume overload; clinically appears dry and has been bolused 2 L NS in ED; BNP pending  - Continue current management with Aldactone and bisoprolol  - SLIV,  fluid-restrict diet, follow daily wts and strict I/O's    5. Paroxysmal atrial fibrillation  - In sinus rhythm on admission  - CHADS-VASc is at least 2 (CHF, CAD); also has history of PE in 2012  - Anticoagulated with Xarelto, will continue  - Monitoring on telemetry   6. CAD - Has stent to RCA  - Managed with ASA 81, bisoprolol, prn NTG  - No anginal complaints on admission, no acute ischemic features on EKG, trop 0.00  - Continue current management    7. Alcohol abuse  - Pt reports he has returned to drinking after several weeks of abstinence, but notes that he is drinking less  - EtOH <5 on admission  - Monitor with CIWA and prn Ativan  - Abstinence encouraged    8. Liver disease with ascites - CT demonstrates hepatic steatosis with moderate ascites  - Likely secondary to alcohol abuse, ongoing  - Counseled toward abstinence    9. ?UTI  - UA with few bacteria, small leukocyte, positive nitrite, but has squam epi's and only 0-5 WBCs  - Sample sent for culture; empiric Rocephin administered in ED  - Pt denies dysuria or lower abd pain; no fever or leukocytosis; hold abx for now and monitor culture - Check PCT    DVT prophylaxis: Xarelto  Code Status: Full  Family Communication: Discussed with patient  Disposition Plan: Observe on telemetry   Consults called: None Admission status: Observation   Vianne Bulls, MD [facility] Pager [phone]  If 7PM-7AM, please contact night-coverage [email or web address] Password [facility]  04/27/2016, 10:05 PM

## 2016-04-27 NOTE — ED Notes (Signed)
Patient comes from [facility] healthcare after facility stated he was having SVT at 130-140.  Patient started to have nausea on the trip over here.  Having dry heaves upon arrival. Patient A&Ox4

## 2016-04-27 NOTE — ED Provider Notes (Signed)
CSN: [id number]     Arrival date & time 04/27/16  1636 History   First MD Initiated Contact with Patient 04/27/16 1654     Chief Complaint  Patient presents with  . Tachycardia  . Abdominal Pain     (Consider location/radiation/quality/duration/timing/severity/associated sxs/prior Treatment) HPI   Pt with hx HTN, HLD, CAD, AICD in place, PE on xarelto, ongoing workup for syncope, alcoholism sent to ED by cardiology for tachycardia and orthostasis.  Pt c/o diffuse abdominal pain, N/V, productive cough x 4 days. The pain is twisting, 10/10 intensity.  Has not had a bowel movement today but notes bowel movement yesterday. Pt also c/o chest pain that he states is his typical daily chest pain, not currently present.  Denies fevers, SOB, urinary symptoms.  Pt often contradicts himself in giving his history, whether or not he has pain.  He also does not differentiate between vomiting and productive cough.    Level V caveat for poor historian  Past Medical History  Diagnosis Date  . Obesity   . Hypertension   . Hyperlipidemia   . Systolic heart failure   . Coronary artery disease     native vessel  . AICD (automatic cardioverter/defibrillator) present 2006    [place] 7232  . Ischemic cardiomyopathy     ischemic  . [place] Paroxysmal ventricular tachycardia ([place])     Rx via ATP 2008  . Pulmonary embolism ([place])   . MI, old   . OSA (obstructive sleep apnea)     PCP aware, pt does not use.  . Depression   . Shortness of breath dyspnea   . GERD (gastroesophageal reflux disease)   . Arthritis     osteoarthritis  . Alcohol abuse   . Chronic systolic CHF (congestive heart failure) ([place]) 03/16/2016  . Hyponatremia 03/2016   Past Surgical History  Procedure Laterality Date  . Cardiac defibrillator placement  2006    Medtronic Maximo 7232  . Knee surgery Left     arthroscopy  . Implantable cardioverter defibrillator generator change N/A 09/02/2014    Procedure:  IMPLANTABLE CARDIOVERTER DEFIBRILLATOR GENERATOR CHANGE;  Surgeon: Deboraha Sprang, MD;  Location: [facility] CATH LAB;  Service: Cardiovascular;  Laterality: N/A;  . Lead revision N/A 09/02/2014    Procedure: LEAD REVISION;  Surgeon: Deboraha Sprang, MD;  Location: [facility] C[facility] LAB;  Service: Cardiovascular;  Laterality: N/A;  . Esophagogastroduodenoscopy (egd) with propofol N/A 01/21/2016    Procedure: ESOPHAGOGASTRODUODENOSCOPY (EGD) WITH PROPOFOL;  Surgeon: Rogene Houston, MD;  Location: [facility];  Service: Endoscopy;  Laterality: N/A;  8:30 - to be done under Fluoro  . Esophageal dilation N/A 01/21/2016    Procedure: ESOPHAGEAL DILATION;  Surgeon: Rogene Houston, MD;  Location: [facility];  Service: Endoscopy;  Laterality: N/A;  . Colonoscopy with propofol N/A 01/21/2016    Procedure: COLONOSCOPY WITH PROPOFOL;  Surgeon: Rogene Houston, MD;  Location: [facility];  Service: Endoscopy;  Laterality: N/A;  . Biopsy  01/21/2016    Procedure: BIOPSY;  Surgeon: Rogene Houston, MD;  Location: [facility];  Service: Endoscopy;;  Esophageal biopsies with brushings  . Polypectomy  01/21/2016    Procedure: POLYPECTOMY;  Surgeon: Rogene Houston, MD;  Location: [facility];  Service: Endoscopy;;  Ascending colon polyp removed via cold snare   Family History  Problem Relation Age of Onset  . Coronary aneurysm Mother   . Esophageal cancer Father    Social  History  Substance Use Topics  . Smoking status: Current Every Day Smoker -- 0.50 packs/day for 30 years    Types: Cigarettes  . Smokeless tobacco: Never Used     Comment: 3-4 cigarettes a day.  . Alcohol Use: 5.4 oz/week    9 Shots of liquor, 0 Standard drinks or equivalent per week     Comment: 1/2 pint rarely per pt - past hx of heavy alcoholism per pt, just stopped Feb 3//2017    Review of Systems  Unable to perform ROS: Other   Unclear historian.      Allergies  Review of patient's allergies indicates no known allergies.  Home  Medications   Prior to Admission medications   Medication Sig Start Date End Date Taking? Authorizing Provider  acetaminophen (TYLENOL) 325 MG tablet Take 650 mg by mouth every 6 (six) hours as needed for moderate pain.   Yes Historical Provider, MD  aspirin 81 MG EC tablet Take 1 tablet (81 mg total) by mouth every other day. 01/22/16  Yes Rogene Houston, MD  bisoprolol (ZEBETA) 5 MG tablet Take 1 tablet (5 mg total) by mouth daily. 03/20/16  Yes Reyne Dumas, MD  calcium carbonate (OS-CAL) 1250 (500 Ca) MG chewable tablet Chew 1 tablet (1,250 mg total) by mouth 2 (two) times daily. 03/20/16  Yes Reyne Dumas, MD  cholecalciferol (VITAMIN D) 1000 UNITS tablet Take 1,000 Units by mouth daily.    Yes Historical Provider, MD  folic acid (FOLVITE) 1 MG tablet Take 1 mg by mouth daily.  11/17/13  Yes Historical Provider, MD  nitroGLYCERIN (NITROSTAT) 0.4 MG SL tablet Place 0.4 mg under the tongue every 5 (five) minutes as needed for chest pain.   Yes Historical Provider, MD  omeprazole (PRILOSEC) 40 MG capsule Take 40 mg by mouth daily.   Yes Historical Provider, MD  potassium chloride 20 MEQ TBCR Take 40 mEq by mouth 2 (two) times daily. 03/20/16  Yes Reyne Dumas, MD  rivaroxaban (XARELTO) 20 MG TABS tablet Take 1 tablet (20 mg total) by mouth daily with supper. 04/27/16  Yes Isaiah Serge, NP  spironolactone (ALDACTONE) 25 MG tablet Take 0.5 tablets (12.5 mg total) by mouth daily. Patient taking differently: Take 25 mg by mouth daily.  03/20/16  Yes Reyne Dumas, MD  sucralfate (CARAFATE) 1 GM/10ML suspension Take 10 mLs (1 g total) by mouth 4 (four) times daily -  with meals and at bedtime. 01/21/16  Yes Rogene Houston, MD  tiotropium (SPIRIVA) 18 MCG inhalation capsule Place 18 mcg into inhaler and inhale daily as needed (shortness of breath).    Yes Historical Provider, MD  magnesium oxide (MAG-OX) 400 (241.3 Mg) MG tablet Take 2 tablets (800 mg total) by mouth 3 (three) times daily. Patient not  taking: Reported on 04/27/2016 03/20/16   Reyne Dumas, MD  pantoprazole (PROTONIX) 40 MG tablet Take 1 tablet (40 mg total) by mouth 2 (two) times daily. Patient not taking: Reported on 04/27/2016 03/20/16   Reyne Dumas, MD  saccharomyces boulardii (FLORASTOR) 250 MG capsule Take 1 capsule (250 mg total) by mouth 2 (two) times daily. 03/20/16   Reyne Dumas, MD  thiamine 100 MG tablet Take 1 tablet (100 mg total) by mouth daily. Patient not taking: Reported on 04/27/2016 03/20/16   Reyne Dumas, MD   BP 126/85 mmHg  Pulse 130  Temp(Src) 98.5 F (36.9 C) (Oral)  Resp 22  SpO2 98% Physical Exam  Constitutional: He appears well-developed and well-nourished. No  distress.  HENT:  Head: Normocephalic and atraumatic.  Neck: Neck supple.  Cardiovascular: Normal rate and regular rhythm.   Pulmonary/Chest: Effort normal and breath sounds normal. No respiratory distress. He has no wheezes. He has no rales.  Defibrillator in place, left chest  Abdominal: Soft. He exhibits distension. There is tenderness. There is no rebound and no guarding.  Neurological: He is alert. He exhibits normal muscle tone.  Skin: He is not diaphoretic.  Nursing note and vitals reviewed.   ED Course  Procedures (including critical care time) Labs Review Labs Reviewed  COMPREHENSIVE METABOLIC PANEL - Abnormal; Notable for the following:    Sodium 130 (*)    Potassium 2.8 (*)    Chloride 95 (*)    Calcium 7.2 (*)    Total Protein 5.9 (*)    Albumin 1.5 (*)    AST 61 (*)    Alkaline Phosphatase 191 (*)    Total Bilirubin 2.9 (*)    All other components within normal limits  CBC WITH DIFFERENTIAL/PLATELET - Abnormal; Notable for the following:    RBC 2.65 (*)    Hemoglobin 8.6 (*)    HCT 26.1 (*)    RDW 21.0 (*)    Platelets 135 (*)    Monocytes Absolute 1.2 (*)    All other components within normal limits  URINALYSIS, ROUTINE W REFLEX MICROSCOPIC (NOT AT [facility]) - Abnormal; Notable for the following:    Color,  Urine AMBER (*)    APPearance HAZY (*)    Bilirubin Urine LARGE (*)    Ketones, ur 15 (*)    Protein, ur 30 (*)    Nitrite POSITIVE (*)    Leukocytes, UA SMALL (*)    All other components within normal limits  URINE RAPID DRUG SCREEN, HOSP PERFORMED - Abnormal; Notable for the following:    Tetrahydrocannabinol POSITIVE (*)    All other components within normal limits  URINE MICROSCOPIC-ADD ON - Abnormal; Notable for the following:    Squamous Epithelial / LPF 0-5 (*)    Bacteria, UA FEW (*)    Casts HYALINE CASTS (*)    All other components within normal limits  LIPASE, BLOOD  ETHANOL    Imaging Review No results found. I have personally reviewed and evaluated these images and lab results as part of my medical decision-making.   EKG Interpretation   Date/Time:  Thursday April 27 2016 16:48:09 EDT Ventricular Rate:  124 PR Interval:    QRS Duration: 102 QT Interval:  347 QTC Calculation: 499 R Axis:   23 Text Interpretation:  Sinus tachycardia Inferior infarct, old  Anterolateral infarct, age indeterminate No significant change since last  tracing Confirmed by JACUBOWITZ  MD, SAM [phone]) on 04/27/2016 4:58:16 PM      MDM   Final diagnoses:  Tachycardia  Orthostasis  UTI (lower urinary tract infection)  Ileus (HCC)  Hypokalemia   Afebrile patient with pale, sickly appearance sent in by cardiology office with tachycardia and orthostasis.  He is clinically dry appearing, reports abdominal pain and vomiting.  Labs significant for electrolyte and liver abnormalities, most notably hypokalemia, other changes c/w chronic alcoholism.  UA appears infected with positive nitrites, rocephin ordered.  Acute abd xray demonstrates pattern c/w ileus, small bowel obstruction not excluded.  Abdomen is soft and pt not currently vomiting, actually asking for food.  CT abd/pelvis ordered though I doubt bowel obstruction.  Pt also seen and examined by Dr Winfred Leeds who recommends adding  sepsis protocol orders, proceeding  with CT.  Admission to [facility], Dr Myna Hidalgo accepting.      Clayton Bibles, PA-C 04/27/16 2043  Orlie Dakin, MD 04/27/16 [phone]

## 2016-04-27 NOTE — ED Provider Notes (Signed)
Patient reports multiple episodes of vomiting over the past 2 days and vague periumbilical pain. He is presently pain-free, without treatment. On exam patient is alert Glasgow Coma Score 15 chronically ill-appearing. HNT exam mucus memories dry, pale. Neck supple lungs clear auscultation heart tachycardic regular rhythm abdomen mildly distended, normoactive bowels, nontender. Extremities with trace pretibial pitting edema bilaterally  Orlie Dakin, MD 04/27/16 1956

## 2016-04-27 NOTE — Progress Notes (Signed)
ICD check in clinic- add on with Cecilie Kicks, NP for syncope/ post hospital follow up. Presenting rhythm: V sense @ 136bpm. Normal device function. Sensing consistent with previous device measurements. Impedance trends stable over time. No evidence of any ventricular arrhythmias (VT zone @ 200bpm). Histogram distribution appropriate for patient and level of activity- increased V rates noted. Optivol extremely unstable- LI made aware. No changes made this session. Estimated longevity 10.6 years.  Patient symptomatic x 3-4 days, orthostatic hypotension today. Per GT and LI, patient to report to the ER for evaluation, lab work and HR control.

## 2016-04-27 NOTE — Progress Notes (Signed)
Cardiology Office Note   Date:  04/27/2016   ID:  Stephen [name], DOB [birth year], MRN [id number]  PCP:  [name], MD  Cardiologist:  Dr. Caryl Comes    Chief Complaint  Patient presents with  . Follow-up    NO CP, SOB OR SWELLING. NO OTHER COMPLAINTS.      History of Present Illness: Stephen [name] is a 55 y.o. male who presents for post hospitalization  03/16/16 to 03/20/16 with syncope and hypotension.      Past medical history significant for ischemic CM with EF 40-45% with ICD, HTN, alcohol dependence in remission 6 weeks, and hx of PE on rivaroxaban who presented to ER with syncope at his PCP's office.  Interrogation of ICD without VT on day of syncope 03/16/16.  CODE SEPSIS was called but eventually ruled out.  He was tachycardic then as well.  He did have bursts of NSVT.    Today he is here for follow up and is not feeling well- he has had chest pain but he has passed out again.  Initially after discharge he felt well but now like he did.  His HR is 140 today may be SR but also may be A flutter.  He is orthostatic and he almost passed out when he stood up.   He denies any blood in his stool or urine.  He does admit to drinking half of pint of alcohol per day.  He has no appetite.  His ICD was interrogated and optivol is elevated for HF.  His VTach alarm is set at 200 and this has not gone odd, no shocks.  Discussed with Dr. Lovena Le DOD and with HR of 140 and symptoms he is being sent to ER by EMS and further eval.        Past Medical History  Diagnosis Date  . Obesity   . Hypertension   . Hyperlipidemia   . Systolic heart failure   . Coronary artery disease     native vessel  . AICD (automatic cardioverter/defibrillator) present 2006    [place] 7232  . Ischemic cardiomyopathy     ischemic  . [place] Paroxysmal ventricular tachycardia ([place])     Rx via ATP 2008  . Pulmonary embolism ([place])   . MI, old   . OSA (obstructive sleep apnea)     PCP  aware, pt does not use.  . Depression   . Shortness of breath dyspnea   . GERD (gastroesophageal reflux disease)   . Arthritis     osteoarthritis  . Alcohol abuse   . Chronic systolic CHF (congestive heart failure) ([place]) 03/16/2016  . Hyponatremia 03/2016    Past Surgical History  Procedure Laterality Date  . Cardiac defibrillator placement  2006    Medtronic Maximo 7232  . Knee surgery Left     arthroscopy  . Implantable cardioverter defibrillator generator change N/A 09/02/2014    Procedure: IMPLANTABLE CARDIOVERTER DEFIBRILLATOR GENERATOR CHANGE;  Surgeon: Deboraha Sprang, MD;  Location: [facility] CATH LAB;  Service: Cardiovascular;  Laterality: N/A;  . Lead revision N/A 09/02/2014    Procedure: LEAD REVISION;  Surgeon: Deboraha Sprang, MD;  Location: [facility] CATH LAB;  Service: Cardiovascular;  Laterality: N/A;  . Esophagogastroduodenoscopy (egd) with propofol N/A 01/21/2016    Procedure: ESOPHAGOGASTRODUODENOSCOPY (EGD) WITH PROPOFOL;  Surgeon: Rogene Houston, MD;  Location: [facility];  Service: Endoscopy;  Laterality: N/A;  8:30 - to be done under Fluoro  .  Esophageal dilation N/A 01/21/2016    Procedure: ESOPHAGEAL DILATION;  Surgeon: Rogene Houston, MD;  [facility]: [facility];  Service: Endoscopy;  Laterality: N/A;  . Colonoscopy with propofol N/A 01/21/2016    Procedure: COLONOSCOPY WITH PROPOFOL;  Surgeon: Rogene Houston, MD;  [facility];  Service: Endoscopy;  Laterality: N/A;  . Biopsy  01/21/2016    Procedure: BIOPSY;  Surgeon: Rogene Houston, MD;  [facility];  Service: Endoscopy;;  Esophageal biopsies with brushings  . Polypectomy  01/21/2016    Procedure: POLYPECTOMY;  Surgeon: Rogene Houston, MD;  [facility];  Service: Endoscopy;;  Ascending colon polyp removed via cold snare     Current Outpatient Prescriptions  Medication Sig Dispense Refill  . acetaminophen (TYLENOL) 325 MG tablet Take 650 mg by mouth every 6 (six) hours as needed for  moderate pain.    Marland Kitchen aspirin 81 MG EC tablet Take 1 tablet (81 mg total) by mouth every other day. 30 tablet 12  . bisoprolol (ZEBETA) 5 MG tablet Take 1 tablet (5 mg total) by mouth daily. 30 tablet 0  . calcium carbonate (OS-CAL) 1250 (500 Ca) MG chewable tablet Chew 1 tablet (1,250 mg total) by mouth 2 (two) times daily. 60 tablet 1  . cholecalciferol (VITAMIN D) 1000 UNITS tablet Take 1,000 Units by mouth daily.     . folic acid (FOLVITE) 1 MG tablet Take 1 mg by mouth daily.     . magnesium oxide (MAG-OX) 400 (241.3 Mg) MG tablet Take 2 tablets (800 mg total) by mouth 3 (three) times daily. 90 tablet 1  . nitroGLYCERIN (NITROSTAT) 0.4 MG SL tablet Place 0.4 mg under the tongue every 5 (five) minutes as needed for chest pain.    . pantoprazole (PROTONIX) 40 MG tablet Take 1 tablet (40 mg total) by mouth 2 (two) times daily. 60 tablet 1  . potassium chloride 20 MEQ TBCR Take 40 mEq by mouth 2 (two) times daily. 120 tablet 0  . rivaroxaban (XARELTO) 20 MG TABS tablet Take 1 tablet (20 mg total) by mouth daily with supper. 30 tablet 11  . saccharomyces boulardii (FLORASTOR) 250 MG capsule Take 1 capsule (250 mg total) by mouth 2 (two) times daily. 60 capsule 0  . spironolactone (ALDACTONE) 25 MG tablet Take 0.5 tablets (12.5 mg total) by mouth daily. 30 tablet 0  . sucralfate (CARAFATE) 1 GM/10ML suspension Take 10 mLs (1 g total) by mouth 4 (four) times daily -  with meals and at bedtime. 420 mL 0  . thiamine 100 MG tablet Take 1 tablet (100 mg total) by mouth daily. 30 tablet 1  . tiotropium (SPIRIVA) 18 MCG inhalation capsule Place 18 mcg into inhaler and inhale daily as needed (shortness of breath).      No current facility-administered medications for this visit.    Allergies:   Review of patient's allergies indicates no known allergies.    Social History:  The patient  reports that he has been smoking Cigarettes.  He has a 15 pack-year smoking history. He has never used smokeless  tobacco. He reports that he drinks about 5.4 oz of alcohol per week. He reports that he uses illicit drugs (Marijuana).   Family History:  The patient's family history includes Coronary aneurysm in his mother; Esophageal cancer in his father.    ROS:  General:no colds or fevers, + weight decrease Skin:no rashes or ulcers HEENT:no blurred vision, no congestion CV:see HPI PUL:see HPI  GI:no diarrhea constipation or melena, no indigestion GU:no hematuria, no dysuria MS:no joint pain, no claudication Neuro:no syncope, no lightheadedness Endo:no diabetes, no thyroid disease  Wt Readings from Last 3 Encounters:  04/27/16 189 lb 3.2 oz (85.821 kg)  03/20/16 186 lb 4.8 oz (84.505 kg)  01/18/16 195 lb (88.451 kg)     PHYSICAL EXAM: VS:  BP 100/86 mmHg  Pulse 140  Ht 6' (1.829 m)  Wt 189 lb 3.2 oz (85.821 kg)  BMI 25.65 kg/m2 , BMI Body mass index is 25.65 kg/(m^2). General:Pleasant affect, NAD Skin:Warm and dry, brisk capillary refill HEENT:normocephalic, sclera clear, mucus membranes moist Neck:supple, no JVD, no bruits  Heart:S1S2 RRR without murmur, gallup, rub or click Lungs: without rales,  +rhonchi, no wheezes [id number], non tender, + BS, do not palpate liver spleen or masses Ext:1+ lower ext edema,  2+ radial pulses Neuro:alert and oriented, MAE, follows commands, + facial symmetry    EKG:  EKG is ordered today. The ekg ordered today demonstrates tachycardia LVH new Q waves in V4-V6.  Reviewed with Dr. Lovena Le, unsure if ST or a flutter.      Recent Labs: 03/16/2016: B Natriuretic Peptide 168.1* 03/17/2016: TSH 1.918 03/19/2016: ALT 25 03/20/2016: BUN 11; Creatinine, Ser 0.82; Hemoglobin 8.9*; Magnesium 1.4*; Platelets 95*; Potassium 5.8*; Sodium 129*    Lipid Panel    Component Value Date/Time   CHOL 95 07/14/2014 0436   TRIG 85 07/14/2014 0436   HDL 55 07/14/2014 0436   CHOLHDL 1.7 07/14/2014 0436   VLDL 17 07/14/2014 0436   LDLCALC 23 07/14/2014 0436        Other studies Reviewed: Additional studies/ records that were reviewed today include:   Echo 10/27/15  Study Conclusions ef 40-45%  - Left ventricle: The cavity size was normal. There was mild focal  basal hypertrophy of the septum. Systolic function was mildly to  moderately reduced. The estimated ejection fraction was in the  range of 40% to 45%. Akinesis and scarring of the inferolateral,  inferior, and inferoseptal myocardium; in the distribution of the  right coronary artery. Due to tachycardia, there was fusion of  early and atrial contributions to ventricular filling. The study  is not technically sufficient to allow evaluation of LV diastolic  function. - Mitral valve: There was mild to moderate regurgitation directed  eccentrically and posteriorly.  Study Conclusions  - Left ventricle: The cavity size was normal. Wall thickness was  increased in a pattern of mild LVH. Systolic function was  moderately to severely reduced. The estimated ejection fraction  was in the range of 30% to 35%. Inferior and inferolateral  akinesis. Hypokinesis of the inferoseptal wall. Indeterminant  diastolic function. - Aortic valve: There was no stenosis. - Mitral valve: There was trivial regurgitation. - Right ventricle: Poorly visualized. The cavity size was normal.  Pacer wire or catheter noted in right ventricle. Systolic  function was normal. - Pulmonary arteries: No complete TR doppler jet so unable to  estimate PA systolic pressure. - Systemic veins: IVC not visualized.  Impressions:  - Technically difficult study with poor acoustic windows. Normal LV  size with mild LV hypertrophy. EF 30-35%. Wall motion  abnormalities as above. RV poorly visualized, grossly normal. No  significant valvular abnormalities.  ASSESSMENT AND PLAN:  1. Hypotension and syncope: now with tachycardia, at 140 at times.  + orthostatic hypotension.    2.  Anemia with  last Hgb 8.9 --pt pale today  3.  Hx Hyponatremia  4. Ischemic cardiomyopathy.  Has ICD interrogated today.  optivol is elevated.   5. Sleep apnea but does not tolerate cpap   6. Hx of PE on xarelto.   7.  Acute on chronic systolic HF.  optivol elevated, needs to be diuresed as well.  8. ETOH use has been drinking half a pint a day. His wife is no longer with him and his daughters help as often as possible.  One daughter may move in with him.  difficulut because pt not helping himself.    9. Hx of SVT and pAF:  CHADS2Vasc 3. On anticoagulant and bisoprolol.  Dr. Lovena Le has seen the pt as well.  Will send by EMS (daughter did not think she could manage him) to ER we have called to let them know he is coming.    The following changes have been made:  See above Labs/ tests ordered today include:see above  Disposition:   FU:  see above  Signed, Cecilie Kicks, NP  04/27/2016 4:10 PM    [facility] [place], [address] [place], [place] Phone: [phone]; Fax: [phone]  Cardiology Attending  Patient seen and examined. Agree with above note by Cecilie Kicks, NP.  He has a constellation of syncope, hypotension and tachycardia. Will transfer to the ED for additional evaluation. I am concerned about occult bleeding.  Mikle Bosworth.D.

## 2016-04-27 NOTE — Patient Instructions (Signed)
Your provider recommends you go directly to [facility] Emergency Department for treatment of SVT

## 2016-04-28 ENCOUNTER — Telehealth: Payer: Self-pay

## 2016-04-28 ENCOUNTER — Encounter (HOSPITAL_COMMUNITY): Payer: Self-pay | Admitting: General Practice

## 2016-04-28 DIAGNOSIS — I951 Orthostatic hypotension: Secondary | ICD-10-CM

## 2016-04-28 DIAGNOSIS — N179 Acute kidney failure, unspecified: Secondary | ICD-10-CM | POA: Diagnosis not present

## 2016-04-28 DIAGNOSIS — F1010 Alcohol abuse, uncomplicated: Secondary | ICD-10-CM | POA: Diagnosis not present

## 2016-04-28 DIAGNOSIS — I10 Essential (primary) hypertension: Secondary | ICD-10-CM | POA: Diagnosis not present

## 2016-04-28 DIAGNOSIS — E876 Hypokalemia: Secondary | ICD-10-CM

## 2016-04-28 DIAGNOSIS — D638 Anemia in other chronic diseases classified elsewhere: Secondary | ICD-10-CM

## 2016-04-28 LAB — FERRITIN: Ferritin: 760 ng/mL — ABNORMAL HIGH (ref 24–336)

## 2016-04-28 LAB — GLUCOSE, CAPILLARY
Glucose-Capillary: 96 mg/dL (ref 65–99)
Glucose-Capillary: 147 mg/dL — ABNORMAL HIGH (ref 65–99)

## 2016-04-28 LAB — IRON AND TIBC: Iron: 71 ug/dL (ref 45–182)

## 2016-04-28 LAB — LACTIC ACID, PLASMA: LACTIC ACID, VENOUS: 1.6 mmol/L (ref 0.5–2.0)

## 2016-04-28 LAB — VITAMIN B12: VITAMIN B 12: 1365 pg/mL — AB (ref 180–914)

## 2016-04-28 LAB — POTASSIUM: Potassium: 2.8 mmol/L — ABNORMAL LOW (ref 3.5–5.1)

## 2016-04-28 MED ORDER — MAGNESIUM SULFATE 2 GM/50ML IV SOLN
2.0000 g | Freq: Once | INTRAVENOUS | Status: AC
  Administered 2016-04-28: 2 g via INTRAVENOUS
  Filled 2016-04-28: qty 50

## 2016-04-28 MED ORDER — METOPROLOL TARTRATE 25 MG PO TABS
25.0000 mg | ORAL_TABLET | Freq: Two times a day (BID) | ORAL | Status: DC
  Administered 2016-04-28 – 2016-05-02 (×9): 25 mg via ORAL
  Filled 2016-04-28 (×9): qty 1

## 2016-04-28 MED ORDER — ENSURE ENLIVE PO LIQD
237.0000 mL | Freq: Two times a day (BID) | ORAL | Status: DC
  Administered 2016-04-28 – 2016-05-02 (×9): 237 mL via ORAL

## 2016-04-28 NOTE — Discharge Instructions (Addendum)
Information on my medicine - XARELTO® (Rivaroxaban) ° °This medication education was reviewed with me or my healthcare representative as part of my discharge preparation.   ° °Why was Xarelto® prescribed for you? °Xarelto® was prescribed for you to reduce the risk of a blood clot forming that can cause a stroke if you have a medical condition called atrial fibrillation (a type of irregular heartbeat). ° °What do you need to know about xarelto® ? °Take your Xarelto® ONCE DAILY at the same time every day with your evening meal. °If you have difficulty swallowing the tablet whole, you may crush it and mix in applesauce just prior to taking your dose. ° °Take Xarelto® exactly as prescribed by your doctor and DO NOT stop taking Xarelto® without talking to the doctor who prescribed the medication.  Stopping without other stroke prevention medication to take the place of Xarelto® may increase your risk of developing a clot that causes a stroke.  Refill your prescription before you run out. ° °After discharge, you should have regular check-up appointments with your healthcare provider that is prescribing your Xarelto®.  In the future your dose may need to be changed if your kidney function or weight changes by a significant amount. ° °What do you do if you miss a dose? °If you are taking Xarelto® ONCE DAILY and you miss a dose, take it as soon as you remember on the same day then continue your regularly scheduled once daily regimen the next day. Do not take two doses of Xarelto® at the same time or on the same day.  ° °Important Safety Information °A possible side effect of Xarelto® is bleeding. You should call your healthcare provider right away if you experience any of the following: °? Bleeding from an injury or your nose that does not stop. °? Unusual colored urine (red or dark [place]) or unusual colored stools (red or black). °? Unusual bruising for unknown reasons. °? A serious fall or if you hit your head (even if  there is no bleeding). ° °Some medicines may interact with Xarelto® and might increase your risk of bleeding while on Xarelto®. To help avoid this, consult your healthcare provider or pharmacist prior to using any new prescription or non-prescription medications, including herbals, vitamins, non-steroidal anti-inflammatory drugs (NSAIDs) and supplements. ° °This website has more information on Xarelto®: www.xarelto.com. ° ° °

## 2016-04-28 NOTE — Progress Notes (Signed)
Initial Nutrition Assessment  DOCUMENTATION CODES:   Not applicable  INTERVENTION:    Continue Ensure Enlive po BID, each supplement provides 350 kcal and 20 grams of protein  NUTRITION DIAGNOSIS:   Increased nutrient needs related to chronic illness as evidenced by estimated needs  GOAL:   Patient will meet greater than or equal to 90% of their needs  MONITOR:   PO intake, Supplement acceptance, Labs, Weight trends, I & O's  REASON FOR ASSESSMENT:   Malnutrition Screening Tool  ASSESSMENT:   55 y.o. Male with PMH significant for ischemic cardiomyopathy with ICD, paroxysmal atrial fibrillation on Xarelto, anemia of chronic disease, GERD with recent diagnosis of esophageal ulcers and alcohol abuse with chronic liver disease who presented to ED from cardiology office for evaluation of tachycardia (HR in 140's range) and orthostasis. Patient was recently hospitalized from 03/16/2016 to 03/20/2016 with hypotension and tachycardia, ruled out for sepsis, treated for dehydration and discharged in stable condition.   PO intake good at 75-100% per flowsheet records. Nutrient needs increased given chronic illness. Ensure Enlive nutrition supplements in place. Nutrition focused physical exam completed.  No muscle or subcutaneous fat depletion noticed.  Diet Order:  Diet Heart Room service appropriate?: Yes; Fluid consistency:: Thin; Fluid restriction:: 1500 mL Fluid  Skin:  Reviewed, no issues  Last BM:  6/22  Height:   Ht Readings from Last 1 Encounters:  04/27/16 6' (1.829 m)    Weight:   Wt Readings from Last 1 Encounters:  04/28/16 192 lb 4.8 oz (87.227 kg)    Wt Readings from Last 10 Encounters:  04/28/16 192 lb 4.8 oz (87.227 kg)  04/27/16 189 lb 3.2 oz (85.821 kg)  03/20/16 186 lb 4.8 oz (84.505 kg)  01/18/16 195 lb (88.451 kg)  01/17/16 194 lb (87.998 kg)  01/05/16 192 lb 12.8 oz (87.454 kg)  12/21/15 193 lb 3.2 oz (87.635 kg)  11/11/15 215 lb (97.523 kg)   10/25/15 214 lb (97.07 kg)  10/15/15 214 lb (97.07 kg)    Ideal Body Weight:  81 kg  BMI:  Body mass index is 26.07 kg/(m^2).  Estimated Nutritional Needs:   Kcal:  2000-2200  Protein:  100-110 gm  Fluid:  2.0-2.2 L  EDUCATION NEEDS:   No education needs identified at this time  Arthur Holms, RD, LDN Pager #: [phone] After-Hours Pager #: [phone]

## 2016-04-28 NOTE — Telephone Encounter (Signed)
Prior auth for Xarelto 20mg submitted to [facility]. 

## 2016-04-28 NOTE — Progress Notes (Addendum)
Patient ID: Stephen [name], male   DOB: 08-Jul-1961, 55 y.o.   MRN: [id number]  PROGRESS NOTE    AAHAAN [name]  [id number] DOB: 12/29/1960 DOA: 04/27/2016  PCP: [name], MD   Brief Narrative:  55 y.o. male with past medical history significant for ischemic cardiomyopathy with ICD, paroxysmal atrial fibrillation on Xarelto, anemia of chronic disease, GERD with recent diagnosis of esophageal ulcers and alcohol abuse with chronic liver disease who presented to ED from cardiology office for evaluation of tachycardia (HR in 140's range) and orthostasis. Patient was recently hospitalized from 03/16/2016 to 03/20/2016 with hypotension and tachycardia, ruled out for sepsis, treated for dehydration and discharged in stable condition.   Patient was hemodynamically stable on the admission. His oxygen saturation was 92% on room air. His heart rate was 140. At the 12-lead EKG shows Q waves in inferior leads, old, sinus Tachycardia. Abdominal x-ray showed no evidence of free air. CT abdomen showed hepatic stat ptosis with moderate amount of ascites. Ethanol level was undetectable. Lipase was within normal limits. AST was 61 and alkaline phosphatase 191 with total bilirubin 2.9. Potassium was 2.8 and was supplemented. UDS was positive for THC. He was admitted for further evaluation of tachycardia and orthostasis.  Assessment & Plan:   Orthostasis, tachycardia  - Patient has sinus tachycardia on 12-lead EKG. No reports of chest pain. - Alcohol level was within normal limits, UDS positive for THC - No evidence of acute infectious process. UTI has small leukocytes but patient has no urinary symptoms such as dysuria or hematuria or urinary frequency or urgency - Obtain PT evaluation - He has received IV fluids in ED - We stopped bisoprolol and we started metoprolol for heart rate control - Will observe for next 24 hours on telemetry   Anemia of chronic disease / thrombocytopenia - Related to  bone marrow suppression from alcohol abuse - Continue to monitor cell counts  Hyponatremia, hypokalemia, hypomagnesemia - Potassium within normal limits in sodium 130 - Supplemented magnesium as it was 1.2 - Check electrolytes tomorrow morning  Chronic systolic CHF - TTE (123456) with EF 30-35%; mild LVH; inferolateral akinesis; inferoseptal hypokinesis; diastolic function could not be determined  - Patient appeared dehydrated on the admission and has received 2 L normal saline in ED - He feels better this morning, stable respiratory status   Paroxysmal atrial fibrillation  - CHADS-VASc is at least 2 (CHF, CAD); also has history of PE in 2012  - Continue anticoagulation with Xarelto - Continue rate controlled with metoprolol  CAD - Has stent to RCA  - No chest pain - Continue aspirin daily  Alcohol abuse  - EtOH <5 on admission   Liver disease with ascites - CT demonstrates hepatic steatosis with moderate ascites  - No evidence of decompensation, abdomen is soft but not very distended and he feels comfortable in regards to his respiratory status   DVT prophylaxis: On full dose anticoagulation with xarelto  Code Status: full code  Family Communication: no family at the bedside this am  Disposition Plan: home once tachycardia improves    Consultants:   None   Procedures:   None   Antimicrobials:   None    Subjective: Feels okay this am.  Objective: Filed Vitals:   04/27/16 2030 04/27/16 2207 04/28/16 0635 04/28/16 0812  BP: 125/89 113/80 125/98 125/89  Pulse: 117 127 120 127  Temp:  98.9 F (37.2 C) 98.7 F (37.1 C)   TempSrc:  Oral Oral  Resp: 21 21 22 20   Height:  6' (1.829 m)    Weight:  86.501 kg (190 lb 11.2 oz) 87.227 kg (192 lb 4.8 oz)   SpO2: 93% 100% 96% 99%    Intake/Output Summary (Last 24 hours) at 04/28/16 1123 Last data filed at 04/28/16 0907  Gross per 24 hour  Intake    480 ml  Output      0 ml  Net    480 ml   Filed  Weights   04/27/16 2029 04/27/16 2207 04/28/16 0635  Weight: 86.183 kg (190 lb) 86.501 kg (190 lb 11.2 oz) 87.227 kg (192 lb 4.8 oz)    Examination:  General exam: Appears calm and comfortable, ashy skin  Respiratory system: Clear to auscultation. Respiratory effort normal. Cardiovascular system: S1 & S2 heard, tachycardic  Gastrointestinal system: Abdomen is nondistended, soft and nontender. No organomegaly or masses felt. Normal bowel sounds heard. Central nervous system: Alert and oriented. No focal neurological deficits. Extremities: Symmetric 5 x 5 power. Trace LE edema  Skin: No rashes, lesions or ulcers Psychiatry: Judgement and insight appear normal. Mood & affect appropriate.   Data Reviewed: I have personally reviewed following labs and imaging studies  CBC:  Recent Labs Lab 04/27/16 1752  WBC 8.4  NEUTROABS 5.9  HGB 8.6*  HCT 26.1*  MCV 98.5  PLT [id number]*   Basic Metabolic Panel:  Recent Labs Lab 04/27/16 1752 04/27/16 2218  NA 130*  --   K 2.8*  --   CL 95*  --   CO2 23  --   GLUCOSE 96  --   BUN 10  --   CREATININE 1.20  --   CALCIUM 7.2*  --   MG  --  1.2*   GFR: Estimated Creatinine Clearance: 75.4 mL/min (by C-G formula based on Cr of 1.2). Liver Function Tests:  Recent Labs Lab 04/27/16 1752  AST 61*  ALT 35  ALKPHOS 191*  BILITOT 2.9*  PROT 5.9*  ALBUMIN 1.5*    Recent Labs Lab 04/27/16 1752  LIPASE 19   No results for input(s): AMMONIA in the last 168 hours. Coagulation Profile: No results for input(s): INR, PROTIME in the last 168 hours. Cardiac Enzymes: No results for input(s): CKTOTAL, CKMB, CKMBINDEX, TROPONINI in the last 168 hours. BNP (last 3 results) No results for input(s): PROBNP in the last 8760 hours. HbA1C: No results for input(s): HGBA1C in the last 72 hours. CBG:  Recent Labs Lab 04/27/16 2221 04/28/16 0602  GLUCAP 98 96   Lipid Profile: No results for input(s): CHOL, HDL, LDLCALC, TRIG, CHOLHDL,  LDLDIRECT in the last 72 hours. Thyroid Function Tests: No results for input(s): TSH, T4TOTAL, FREET4, T3FREE, THYROIDAB in the last 72 hours. Anemia Panel:  Recent Labs  04/28/16 0100  VITAMINB12 1365*  FERRITIN 760*  TIBC NOT CALCULATED  IRON 71   Urine analysis:    Component Value Date/Time   COLORURINE AMBER* 04/27/2016 1833   APPEARANCEUR HAZY* 04/27/2016 1833   LABSPEC 1.024 04/27/2016 1833   PHURINE 5.5 04/27/2016 1833   GLUCOSEU NEGATIVE 04/27/2016 1833   HGBUR NEGATIVE 04/27/2016 1833   BILIRUBINUR LARGE* 04/27/2016 1833   KETONESUR 15* 04/27/2016 1833   PROTEINUR 30* 04/27/2016 1833   NITRITE POSITIVE* 04/27/2016 1833   LEUKOCYTESUR SMALL* 04/27/2016 1833   Sepsis Labs: @LABRCNTIP (procalcitonin:4,lacticidven:4)   )No results found for this or any previous visit (from the past 240 hour(s)).    Radiology Studies: Ct Abdomen Pelvis W Contrast 04/27/2016  1.  Hepatic steatosis. Moderate amount of ascites. This appearance can be seen with nonalcoholic steatohepatitis or cirrhosis. 2. No bowel obstruction.   Dg Abd Acute W/chest 04/27/2016  1. No evidence for free air. 2. Dilated large and small bowel loops most consistent with ileus pattern. An early small bowel obstruction would be difficult to exclude.    Scheduled Meds: . aspirin EC  81 mg Oral QODAY  . bisoprolol  5 mg Oral Daily  . calcium carbonate  1,250 mg Oral BID WC  . cholecalciferol  1,000 Units Oral Daily  . feeding supplement (ENSURE ENLIVE)  237 mL Oral BID BM  . folic acid  1 mg Oral Daily  . LORazepam  0-4 mg Oral Q6H   Followed by  . [START ON 04/29/2016] LORazepam  0-4 mg Oral Q12H  . multivitamin with minerals  1 tablet Oral Daily  . pantoprazole  40 mg Oral Daily  . rivaroxaban  20 mg Oral Q supper  . sodium chloride flush  3 mL Intravenous Q12H  . sodium chloride flush  3 mL Intravenous Q12H  . spironolactone  12.5 mg Oral Daily  . sucralfate  1 g Oral TID WC & HS  . thiamine  100 mg  Oral Daily   Continuous Infusions:       Time spent: 25 minutes  Greater than 50% of the time spent on counseling and coordinating the care.   Leisa Lenz, MD [facility] Pager [phone]  If 7PM-7AM, please contact night-coverage [email or web address] Password [email or web address] 04/28/2016, 11:23 AM

## 2016-04-29 DIAGNOSIS — I951 Orthostatic hypotension: Secondary | ICD-10-CM | POA: Diagnosis not present

## 2016-04-29 DIAGNOSIS — D638 Anemia in other chronic diseases classified elsewhere: Secondary | ICD-10-CM | POA: Diagnosis not present

## 2016-04-29 DIAGNOSIS — I255 Ischemic cardiomyopathy: Secondary | ICD-10-CM

## 2016-04-29 DIAGNOSIS — F1010 Alcohol abuse, uncomplicated: Secondary | ICD-10-CM | POA: Diagnosis not present

## 2016-04-29 DIAGNOSIS — N179 Acute kidney failure, unspecified: Secondary | ICD-10-CM | POA: Diagnosis not present

## 2016-04-29 LAB — URINE CULTURE: Culture: 2000 — AB

## 2016-04-29 LAB — CBC
HEMATOCRIT: 22.3 % — AB (ref 39.0–52.0)
Hemoglobin: 7.2 g/dL — ABNORMAL LOW (ref 13.0–17.0)
MCH: 31.3 pg (ref 26.0–34.0)
MCHC: 32.3 g/dL (ref 30.0–36.0)
MCV: 97 fL (ref 78.0–100.0)
PLATELETS: 127 10*3/uL — AB (ref 150–400)
RBC: 2.3 MIL/uL — ABNORMAL LOW (ref 4.22–5.81)
RDW: 21.9 % — AB (ref 11.5–15.5)
WBC: 8.1 10*3/uL (ref 4.0–10.5)

## 2016-04-29 LAB — BASIC METABOLIC PANEL
ANION GAP: 6 (ref 5–15)
BUN: 9 mg/dL (ref 6–20)
CALCIUM: 7.8 mg/dL — AB (ref 8.9–10.3)
CO2: 26 mmol/L (ref 22–32)
Chloride: 99 mmol/L — ABNORMAL LOW (ref 101–111)
Creatinine, Ser: 1.03 mg/dL (ref 0.61–1.24)
GFR calc Af Amer: >60 mL/min (ref >60)
Glucose, Bld: 98 mg/dL (ref 65–99)
POTASSIUM: 2.8 mmol/L — AB (ref 3.5–5.1)
SODIUM: 131 mmol/L — AB (ref 135–145)

## 2016-04-29 LAB — GLUCOSE, CAPILLARY
GLUCOSE-CAPILLARY: 100 mg/dL — AB (ref 65–99)
Glucose-Capillary: 138 mg/dL — ABNORMAL HIGH (ref 65–99)

## 2016-04-29 LAB — PROCALCITONIN: PROCALCITONIN: 0.76 ng/mL

## 2016-04-29 LAB — POTASSIUM: POTASSIUM: 3.2 mmol/L — AB (ref 3.5–5.1)

## 2016-04-29 LAB — MAGNESIUM: MAGNESIUM: 1.7 mg/dL (ref 1.7–2.4)

## 2016-04-29 MED ORDER — CALCIUM CARBONATE 1250 (500 CA) MG PO TABS
1250.0000 mg | ORAL_TABLET | Freq: Two times a day (BID) | ORAL | Status: DC
  Administered 2016-04-29 – 2016-05-02 (×7): 1250 mg via ORAL
  Filled 2016-04-29 (×7): qty 1

## 2016-04-29 MED ORDER — POTASSIUM CHLORIDE CRYS ER 20 MEQ PO TBCR
40.0000 meq | EXTENDED_RELEASE_TABLET | Freq: Once | ORAL | Status: AC
  Administered 2016-04-29: 40 meq via ORAL
  Filled 2016-04-29: qty 2

## 2016-04-29 NOTE — Progress Notes (Addendum)
Patient ID: Stephen [name], male   DOB: [birth year], 55 y.o.   MRN: [id number]  PROGRESS NOTE    Stephen [name]  [id number] DOB: November 16, 1960 DOA: 04/27/2016  PCP: [name], MD   Brief Narrative:  55 y.o. male with past medical history significant for ischemic cardiomyopathy with ICD, paroxysmal atrial fibrillation on Xarelto, anemia of chronic disease, GERD with recent diagnosis of esophageal ulcers and alcohol abuse with chronic liver disease who presented to ED from cardiology office for evaluation of tachycardia (HR in 140's range) and orthostasis. Patient was recently hospitalized from 03/16/2016 to 03/20/2016 with hypotension and tachycardia, ruled out for sepsis, treated for dehydration and discharged in stable condition.   Patient was hemodynamically stable on the admission. His oxygen saturation was 92% on room air. His heart rate was 140. At the 12-lead EKG shows Q waves in inferior leads, old, sinus Tachycardia. Abdominal x-ray showed no evidence of free air. CT abdomen showed hepatic stat ptosis with moderate amount of ascites. Ethanol level was undetectable. Lipase was within normal limits. AST was 61 and alkaline phosphatase 191 with total bilirubin 2.9. Potassium was 2.8 and was supplemented. UDS was positive for THC. He was admitted for further evaluation of tachycardia and orthostasis.  Assessment & Plan:   Orthostasis, tachycardia  - Patient has sinus tachycardia on 12-lead EKG. No reports of chest pain. - Alcohol level was within normal limits, UDS positive for THC - No evidence of acute infectious process. UTI has small leukocytes but patient has no urinary symptoms such as dysuria or hematuria or urinary frequency or urgency - Changed bisoprolol to metoprolol for better HR control. HR 70-80 range   Anemia of chronic disease / thrombocytopenia - Related to bone marrow suppression from alcohol abuse - Hemoglobin 7.2 this am - Monitor CBC daily as pt on  AC  Hyponatremia, hypokalemia, hypomagnesemia - Potassium within normal limits in sodium 130 - Likely related to hepatic steatosis, history of alcohol use - Supplemented magnesium and this am WNL - Continue potassium supplementation - Repeat potassium later on today and tomorrow am  Chronic systolic CHF - TTE (123456) with EF 30-35%; mild LVH; inferolateral akinesis; inferoseptal hypokinesis; diastolic function could not be determined  - Compensated    Paroxysmal atrial fibrillation  - CHADS-VASc is at least 2 (CHF, CAD); also has history of PE in 2012  - Continue anticoagulation with Xarelto - Continue heart rate controlled with metoprolol  CAD - Has stent to RCA  - Continue aspirin daily  Alcohol abuse  - EtOH <5 on admission  - No withdrawals   Liver disease with ascites - CT demonstrates hepatic steatosis with moderate ascites  - No evidence of decompensation - Continue protonix daily - Continue multivitamin and folic acid     DVT prophylaxis: On full dose anticoagulation with xarelto  Code Status: full code  Family Communication: no family at the bedside this am  Disposition Plan: home tomorrow if potassium WNL   Consultants:   None   Procedures:   None   Antimicrobials:   None    Subjective: No respiratory distress.   Objective: Filed Vitals:   04/28/16 2157 04/29/16 0000 04/29/16 0400 04/29/16 0614  BP: 100/62   95/68  Pulse: 84 79 82 79  Temp: 98.6 F (37 C)   98 F (36.7 C)  TempSrc: Oral   Oral  Resp: 20   20  Height:      Weight:    87.363 kg (192 lb 9.6  oz)  SpO2: 99%   97%    Intake/Output Summary (Last 24 hours) at 04/29/16 1225 Last data filed at 04/29/16 1003  Gross per 24 hour  Intake    843 ml  Output    575 ml  Net    268 ml   Filed Weights   04/27/16 2207 04/28/16 0635 04/29/16 0614  Weight: 86.501 kg (190 lb 11.2 oz) 87.227 kg (192 lb 4.8 oz) 87.363 kg (192 lb 9.6 oz)    Examination:  General exam: no  distress  Respiratory system: Respiratory effort normal. No wheezing. Cardiovascular system: S1 & S2 heard, rate controlled   Gastrointestinal system: (+) BS, non tender  Central nervous system: No focal neurological deficits. Extremities: trace LE edema, no tenderness  Skin: Warm and dry  Psychiatry: Normal mood and behavior    Data Reviewed: I have personally reviewed following labs and imaging studies  CBC:  Recent Labs Lab 04/27/16 1752 04/29/16 0446  WBC 8.4 8.1  NEUTROABS 5.9  --   HGB 8.6* 7.2*  HCT 26.1* 22.3*  MCV 98.5 97.0  PLT 135* [id number]*   Basic Metabolic Panel:  Recent Labs Lab 04/27/16 1752 04/27/16 2218 04/28/16 1134 04/29/16 0446  NA 130*  --   --  131*  K 2.8*  --  2.8* 2.8*  CL 95*  --   --  99*  CO2 23  --   --  26  GLUCOSE 96  --   --  98  BUN 10  --   --  9  CREATININE 1.20  --   --  1.03  CALCIUM 7.2*  --   --  7.8*  MG  --  1.2*  --  1.7   GFR: Estimated Creatinine Clearance: 87.9 mL/min (by C-G formula based on Cr of 1.03). Liver Function Tests:  Recent Labs Lab 04/27/16 1752  AST 61*  ALT 35  ALKPHOS 191*  BILITOT 2.9*  PROT 5.9*  ALBUMIN 1.5*    Recent Labs Lab 04/27/16 1752  LIPASE 19   No results for input(s): AMMONIA in the last 168 hours. Coagulation Profile: No results for input(s): INR, PROTIME in the last 168 hours. Cardiac Enzymes: No results for input(s): CKTOTAL, CKMB, CKMBINDEX, TROPONINI in the last 168 hours. BNP (last 3 results) No results for input(s): PROBNP in the last 8760 hours. HbA1C: No results for input(s): HGBA1C in the last 72 hours. CBG:  Recent Labs Lab 04/27/16 2221 04/28/16 0602 04/28/16 2100 04/29/16 0616  GLUCAP 98 96 147* 100*   Lipid Profile: No results for input(s): CHOL, HDL, LDLCALC, TRIG, CHOLHDL, LDLDIRECT in the last 72 hours. Thyroid Function Tests: No results for input(s): TSH, T4TOTAL, FREET4, T3FREE, THYROIDAB in the last 72 hours. Anemia Panel:  Recent Labs   04/28/16 0100  VITAMINB12 1365*  FERRITIN 760*  TIBC NOT CALCULATED  IRON 71   Urine analysis:    Component Value Date/Time   COLORURINE AMBER* 04/27/2016 1833   APPEARANCEUR HAZY* 04/27/2016 1833   LABSPEC 1.024 04/27/2016 1833   PHURINE 5.5 04/27/2016 1833   GLUCOSEU NEGATIVE 04/27/2016 1833   HGBUR NEGATIVE 04/27/2016 1833   BILIRUBINUR LARGE* 04/27/2016 1833   KETONESUR 15* 04/27/2016 1833   PROTEINUR 30* 04/27/2016 1833   NITRITE POSITIVE* 04/27/2016 1833   LEUKOCYTESUR SMALL* 04/27/2016 1833   Sepsis Labs: @LABRCNTIP (procalcitonin:4,lacticidven:4)   Recent Results (from the past 240 hour(s))  Urine culture     Status: Abnormal   Collection Time: 04/27/16  6:33 PM  Result Value Ref Range Status   Specimen Description URINE, CLEAN CATCH  Final   Special Requests NONE  Final   Culture 2,000 COLONIES/mL INSIGNIFICANT GROWTH (A)  Final   Report Status 04/29/2016 FINAL  Final  Blood Culture (routine x 2)     Status: None (Preliminary result)   Collection Time: 04/27/16  8:11 PM  Result Value Ref Range Status   Specimen Description BLOOD LEFT ANTECUBITAL  Final   Special Requests BOTTLES DRAWN AEROBIC AND ANAEROBIC 5CC  Final   Culture NO GROWTH < 24 HOURS  Final   Report Status PENDING  Incomplete  Blood Culture (routine x 2)     Status: None (Preliminary result)   Collection Time: 04/27/16 10:18 PM  Result Value Ref Range Status   Specimen Description BLOOD LEFT ANTECUBITAL  Final   Special Requests BOTTLES DRAWN AEROBIC ONLY 6CC  Final   Culture NO GROWTH < 24 HOURS  Final   Report Status PENDING  Incomplete      Radiology Studies: Ct Abdomen Pelvis W Contrast 04/27/2016  1. Hepatic steatosis. Moderate amount of ascites. This appearance can be seen with nonalcoholic steatohepatitis or cirrhosis. 2. No bowel obstruction.   Dg Abd Acute W/chest 04/27/2016  1. No evidence for free air. 2. Dilated large and small bowel loops most consistent with ileus pattern. An  early small bowel obstruction would be difficult to exclude.    Scheduled Meds: . aspirin EC  81 mg Oral QODAY  . calcium carbonate  1,250 mg Oral BID WC  . cholecalciferol  1,000 Units Oral Daily  . feeding supplement (ENSURE ENLIVE)  237 mL Oral BID BM  . folic acid  1 mg Oral Daily  . LORazepam  0-4 mg Oral Q6H   Followed by  . LORazepam  0-4 mg Oral Q12H  . metoprolol tartrate  25 mg Oral BID  . multivitamin with minerals  1 tablet Oral Daily  . pantoprazole  40 mg Oral Daily  . rivaroxaban  20 mg Oral Q supper  . sodium chloride flush  3 mL Intravenous Q12H  . sodium chloride flush  3 mL Intravenous Q12H  . spironolactone  12.5 mg Oral Daily  . sucralfate  1 g Oral TID WC & HS  . thiamine  100 mg Oral Daily   Continuous Infusions:       Time spent: 25 minutes  Greater than 50% of the time spent on counseling and coordinating the care.   Leisa Lenz, MD [facility] Pager [phone]  If 7PM-7AM, please contact night-coverage [email or web address] Password [facility] 04/29/2016, 12:25 PM

## 2016-04-29 NOTE — Progress Notes (Signed)
PT Cancellation Note  Patient Details Name: Stephen [name] MRN: [id number] DOB: 02/26/1961   Cancelled Treatment:    Reason Eval/Treat Not Completed: Patient just starting breakfast.  Assisted pt in getting tray set up in front, with pt having difficulty re-positioning self in bed. Will check back to complete PT eval.   [name] LUBECK 04/29/2016, 8:39 AM

## 2016-04-29 NOTE — Evaluation (Signed)
Physical Therapy Evaluation Patient Details Name: Stephen [name] MRN: [id number] DOB: June 12, 1961 Today's Date: 04/29/2016   History of Present Illness  55 y.o. male with past medical history significant for ischemic cardiomyopathy with ICD, paroxysmal atrial fibrillation on Xarelto, anemia of chronic disease, GERD with recent diagnosis of esophageal ulcers and alcohol abuse with chronic liver disease who presented to ED from cardiology office for evaluation of tachycardia (HR in 140's range) and orthostasis.   Clinical Impression  Pt admitted with above diagnosis. Pt currently with functional limitations due to the deficits listed below (see PT Problem List).  Pt will benefit from skilled PT to increase their independence and safety with mobility to allow discharge to the venue listed below.  Unable to ambulate today due to orthostatic vitals and lethargy(possibly due to earlier Ativan per nursing).  Once pt is more awake, hopefully he will be moving better and  be able to return home, but if he continues to have difficulty with mobility then would need to consider SNF, but will continue to assess.     Follow Up Recommendations Supervision for mobility/OOB;Home health PT (if progresses)    Equipment Recommendations  None recommended by PT    Recommendations for Other Services       Precautions / Restrictions Precautions Precautions: Fall      Mobility  Bed Mobility Overal bed mobility: Needs Assistance Bed Mobility: Rolling Rolling: Min assist;Min guard         General bed mobility comments: MIN A to get from R sidelying onto back, min /guard to EOB  Transfers Overall transfer level: Needs assistance Equipment used: Rolling walker (2 wheeled) Transfers: Sit to/from Stand Sit to Stand: Min assist         General transfer comment: MIN A to steady Orthostatic BP taken  Ambulation/Gait             General Gait Details: deferred due to orthostatic vitals  Stairs             Wheelchair Mobility    Modified Rankin (Stroke Patients Only)       Balance Overall balance assessment: Needs assistance   Sitting balance-Leahy Scale: Fair Sitting balance - Comments: Difficulty donning socks. Had to lean far back to get hip/knee bent so don     Standing balance-Leahy Scale: Poor Standing balance comment: required UE support                             Pertinent Vitals/Pain Pain Assessment: No/denies pain    Home Living Family/patient expects to be discharged to:: Private residence Living Arrangements: Alone Available Help at Discharge: Friend(s);Available PRN/intermittently Type of Home: House Home Access: Stairs to enter Entrance Stairs-Rails: Left;Right;Can reach both Entrance Stairs-Number of Steps: 2 Home Layout: One level Home Equipment: Cane - single point;Walker - 2 wheels      Prior Function Level of Independence: Independent with assistive device(s)         Comments: Amb with cane or RW     Hand Dominance        Extremity/Trunk Assessment   Upper Extremity Assessment: Generalized weakness           Lower Extremity Assessment: Generalized weakness      Cervical / Trunk Assessment: Normal  Communication      Cognition Arousal/Alertness: Lethargic;Suspect due to medications Behavior During Therapy: [facility] for tasks assessed/performed;Restless Overall Cognitive Status: Impaired/Different from baseline Area of Impairment: Orientation;Following commands;Problem solving  Orientation Level: Disoriented to;Time     Following Commands: Follows one step commands inconsistently;Follows one step commands with increased time     Problem Solving: Slow processing;Decreased initiation;Difficulty sequencing;Requires tactile cues;Requires verbal cues      General Comments General comments (skin integrity, edema, etc.): Pt restless, but lethargic    Exercises        Assessment/Plan    PT Assessment  Patient needs continued PT services  PT Diagnosis Difficulty walking;Generalized weakness   PT Problem List Decreased balance;Decreased mobility;Decreased safety awareness;Decreased cognition;Decreased activity tolerance  PT Treatment Interventions DME instruction;Gait training;Functional mobility training;Therapeutic activities;Therapeutic exercise;Balance training;Patient/family education   PT Goals (Current goals can be found in the Care Plan section) Acute Rehab PT Goals Patient Stated Goal: to get comfortable in the bed PT Goal Formulation: With patient Time For Goal Achievement: 05/06/16 Potential to Achieve Goals: Good    Frequency Min 3X/week   Barriers to discharge        Co-evaluation               End of Session Equipment Utilized During Treatment: Gait belt Activity Tolerance: Treatment limited secondary to medical complications (Comment) Patient left: in bed;with call bell/phone within reach;with bed alarm set Nurse Communication: Mobility status;Other (comment) (orthostatics)    Functional Assessment Tool Used: clinical judgement and objective findings Functional Limitation: Changing and maintaining body position Changing and Maintaining Body Position Current Status [id number]): At least 1 percent but less than 20 percent impaired, limited or restricted Changing and Maintaining Body Position Goal Status [id number]): 0 percent impaired, limited or restricted    Time: 1050-1118 PT Time Calculation (min) (ACUTE ONLY): 28 min   Charges:   PT Evaluation $PT Eval Moderate Complexity: 1 Procedure PT Treatments $Therapeutic Activity: 8-22 mins   PT G Codes:   PT G-Codes **NOT FOR INPATIENT CLASS** Functional Assessment Tool Used: clinical judgement and objective findings Functional Limitation: Changing and maintaining body position Changing and Maintaining Body Position Current Status [id number]): At least 1 percent but less than 20 percent impaired, limited or  restricted Changing and Maintaining Body Position Goal Status [id number]): 0 percent impaired, limited or restricted    D[place]as [place] LUBECK 04/29/2016, 11:33 AM

## 2016-04-30 DIAGNOSIS — F1010 Alcohol abuse, uncomplicated: Secondary | ICD-10-CM | POA: Diagnosis not present

## 2016-04-30 DIAGNOSIS — I10 Essential (primary) hypertension: Secondary | ICD-10-CM | POA: Diagnosis not present

## 2016-04-30 DIAGNOSIS — I951 Orthostatic hypotension: Secondary | ICD-10-CM | POA: Diagnosis not present

## 2016-04-30 DIAGNOSIS — D638 Anemia in other chronic diseases classified elsewhere: Secondary | ICD-10-CM | POA: Diagnosis not present

## 2016-04-30 LAB — GLUCOSE, CAPILLARY: Glucose-Capillary: 101 mg/dL — ABNORMAL HIGH (ref 65–99)

## 2016-04-30 LAB — CBC
HCT: 24 % — ABNORMAL LOW (ref 39.0–52.0)
Hemoglobin: 7.7 g/dL — ABNORMAL LOW (ref 13.0–17.0)
MCH: 31.8 pg (ref 26.0–34.0)
MCHC: 32.1 g/dL (ref 30.0–36.0)
MCV: 99.2 fL (ref 78.0–100.0)
PLATELETS: 150 10*3/uL (ref 150–400)
RBC: 2.42 MIL/uL — AB (ref 4.22–5.81)
RDW: 22.7 % — AB (ref 11.5–15.5)
WBC: 8.7 10*3/uL (ref 4.0–10.5)

## 2016-04-30 LAB — BASIC METABOLIC PANEL
Anion gap: 5 (ref 5–15)
BUN: 6 mg/dL (ref 6–20)
CALCIUM: 8.1 mg/dL — AB (ref 8.9–10.3)
CHLORIDE: 102 mmol/L (ref 101–111)
CO2: 29 mmol/L (ref 22–32)
CREATININE: 0.86 mg/dL (ref 0.61–1.24)
GFR calc non Af Amer: >60 mL/min (ref >60)
Glucose, Bld: 106 mg/dL — ABNORMAL HIGH (ref 65–99)
Potassium: 3.6 mmol/L (ref 3.5–5.1)
SODIUM: 136 mmol/L (ref 135–145)

## 2016-04-30 NOTE — Care Management Note (Signed)
Case Management Note  Patient Details  Name: Stephen [name] MRN: [id number] Date of Birth: 1961/05/23  Subjective/Objective:                  Chief Complaint: Lightheadedness, tachycardia    Action/Plan: CM spoke to patient at the bedside who states that he is from home with his spouse but PT is recommending SNF at this time due to safety as pt's spouse unable to be with him 24/7 at home. CM will follow for discharge planning needs. SW consulted for possible SNF placement depending on insurance approval and PT reccomendations.   Expected Discharge Date:                  Expected Discharge Plan:  Skilled Nursing Facility  In-House Referral:  Clinical Social Work  Discharge planning Services  CM Consult  Post Acute Care Choice:    Choice offered to:     DME Arranged:    DME Agency:     HH Arranged:    [place] Agency:     Status of Service:  In process, will continue to follow  If discussed at Long Length of Stay Meetings, dates discussed:    Additional Comments:  Guido Sander, RN 04/30/2016, 3:54 PM

## 2016-04-30 NOTE — Care Management Obs Status (Signed)
[place] NOTIFICATION   Patient Details  Name: Stephen [name] MRN: [id number] Date of Birth: 1960-11-08   Medicare Observation Status Notification Given:  Yes CM explained [facility] letter to patient at the bedside. He said that he had no questions, signed the [place] letter, CM placed a copy at the bedside.    Guido Sander, RN 04/30/2016, 3:51 PM

## 2016-04-30 NOTE — Progress Notes (Signed)
Physical Therapy Treatment Patient Details Name: Stephen ANGON MRN: [id number] DOB: 1961-05-25 Today's Date: 04/30/2016    History of Present Illness 55 y.o. male with past medical history significant for ischemic cardiomyopathy with ICD, paroxysmal atrial fibrillation on Xarelto, anemia of chronic disease, GERD with recent diagnosis of esophageal ulcers and alcohol abuse with chronic liver disease who presented to ED from cardiology office for evaluation of tachycardia (HR in 140's range) and orthostasis.     PT Comments    Noting improvement in activity tolerance and postural hypotension over yesterday, but BP drop concomitant with about 5-10 minutes of upright activity still persists; This is concerning for safety at home, and at this point we must consider SNF to rehab to maximize independence and safety with mobility and activity tolerance prior to dc home;   If he makes excellent improvements with orthostatics tomorrow, may consider home with HHPT; He will be home alone at times  Follow Up Recommendations  SNF;Other (comment) (if orthostatics much improved tomorrow, consider home)     Equipment Recommendations  Rolling walker with 5" wheels;3in1 (PT)    Recommendations for Other Services       Precautions / Restrictions Precautions Precautions: Fall Restrictions Weight Bearing Restrictions: No    Mobility  Bed Mobility Overal bed mobility: Needs Assistance Bed Mobility: Supine to Sit     Supine to sit: Supervision     General bed mobility comments: Slow moving, but able to sit up without physical assist  Transfers Overall transfer level: Needs assistance Equipment used: Rolling walker (2 wheeled) Transfers: Sit to/from Stand Sit to Stand: Min guard         General transfer comment: Cues for hand placement and safety; Cues also to self-monitor for activity tolernace  Ambulation/Gait Ambulation/Gait assistance: Min assist Ambulation Distance (Feet): 60 Feet  (x2 with seated rest break) Assistive device: Rolling walker (2 wheeled) Gait Pattern/deviations: Step-through pattern;Decreased step length - right;Decreased step length - left Gait velocity: decr Gait velocity interpretation: Below normal speed for age/gender General Gait Details: Standing BP checked out for walking; cues to self-monitor for activity tolerance; dizzy and needing to sit after stairs   Stairs Stairs: Yes Stairs assistance: Min assist Stair Management: Two rails;Step to pattern;Forwards Number of Stairs: 3 General stair comments: Cues to self-monitor for activity tolerance; reported some diziness afetr stairs, so proceeded to first opportunity to sit  Wheelchair Mobility    Modified Rankin (Stroke Patients Only)       Balance     Sitting balance-Leahy Scale: Fair       Standing balance-Leahy Scale: Poor                      Cognition Arousal/Alertness: Awake/alert Behavior During Therapy: WFL for tasks assessed/performed;Flat affect Overall Cognitive Status: Impaired/Different from baseline Area of Impairment: Safety/judgement         Safety/Judgement: Decreased awareness of safety;Decreased awareness of deficits (watch him closely for symptoms of postural hypotension)     General Comments: Needs near constant cues to self-monitor for activity tolerance    Exercises      General Comments General comments (skin integrity, edema, etc.):    04/30/16 0937 04/30/16 0955 04/30/16 0958  Vital Signs  Pulse Rate --  (!) 102 (!) 101  BP --  100/70 mmHg 113/87 mmHg  Patient Position (if appropriate) --  Sitting (post stair negotiation; symptomatic for dizziness) Sitting (Reclined)  Orthostatic Lying   BP- Lying 105/58 mmHg --  --  Pulse- Lying 95 --  --   Orthostatic Sitting  BP- Sitting 113/77 mmHg --  --   Pulse- Sitting 102 --  --   Orthostatic Standing at 0 minutes  BP- Standing at 0 minutes 131/71 mmHg --  --   Pulse- Standing at 0  minutes 113 --  --          Pertinent Vitals/Pain Pain Assessment: Faces Faces Pain Scale: Hurts a little bit Pain Location: Reported pain in his "ears" which seemed to resolve as this therapist attempted to speak less and at a lower volume    Home Living                      Prior Function            PT Goals (current goals can now be found in the care plan section) Acute Rehab PT Goals Patient Stated Goal: did not state PT Goal Formulation: With patient Time For Goal Achievement: 05/06/16 Potential to Achieve Goals: Good Progress towards PT goals: Progressing toward goals    Frequency  Min 3X/week    PT Plan Discharge plan needs to be updated    Co-evaluation             End of Session Equipment Utilized During Treatment: Gait belt Activity Tolerance: Other (comment) (limited by hypotension after approx 5-8 minutes upright) Patient left: in chair;with call bell/phone within reach;with chair alarm set     Time: 0935-1000 PT Time Calculation (min) (ACUTE ONLY): 25 min  Charges:  $Gait Training: 8-22 mins $Therapeutic Activity: 8-22 mins                    G Codes:      Quin Hoop 04/30/2016, 10:37 AM  Roney Marion, [place] Pager [phone] Office [phone]

## 2016-04-30 NOTE — Progress Notes (Addendum)
Patient ID: Stephen [name], male   DOB: 11/23/1960, 55 y.o.   MRN: [id number]  PROGRESS NOTE    Stephen [name]  [id number] DOB: [birth year] DOA: 04/27/2016  PCP: [name], MD   Brief Narrative:  55 y.o. male with past medical history significant for ischemic cardiomyopathy with ICD, paroxysmal atrial fibrillation on Xarelto, anemia of chronic disease, GERD with recent diagnosis of esophageal ulcers and alcohol abuse with chronic liver disease who presented to ED from cardiology office for evaluation of tachycardia (HR in 140's range) and orthostasis. Patient was recently hospitalized from 03/16/2016 to 03/20/2016 with hypotension and tachycardia, ruled out for sepsis, treated for dehydration and discharged in stable condition.   Patient was hemodynamically stable on the admission. His oxygen saturation was 92% on room air. His heart rate was 140. At the 12-lead EKG shows Q waves in inferior leads, old, sinus Tachycardia. Abdominal x-ray showed no evidence of free air. CT abdomen showed hepatic stat ptosis with moderate amount of ascites. Ethanol level was undetectable. Lipase was within normal limits. AST was 61 and alkaline phosphatase 191 with total bilirubin 2.9. Potassium was 2.8 and was supplemented. UDS was positive for THC. He was admitted for further evaluation of tachycardia and orthostasis.  Assessment & Plan:   Orthostasis, tachycardia  - Patient has sinus tachycardia on 12-lead EKG. No reports of chest pain. - Alcohol level was within normal limits, UDS positive for THC - No evidence of acute infectious process. UTI has small leukocytes but patient has no urinary symptoms such as dysuria or hematuria or urinary frequency or urgency - Changed bisoprolol to metoprolol for better HR control.  - with physical therapy patient orthostatic after ambulating systolic blood pressure dropped by 30. Per physical therapy, recommendation for skilled nursing facility  placement.  Anemia of chronic disease / thrombocytopenia - Related to bone marrow suppression from alcohol abuse - Hemoglobin stable at 7.7  Hyponatremia, hypokalemia, hypomagnesemia - Potassium within normal limits in sodium 130 - Likely related to hepatic steatosis, history of alcohol use - Supplemented magnesium and potassium. - Potassium within normal limits, magnesium within normal limits  Chronic systolic CHF - TTE (123456) with EF 30-35%; mild LVH; inferolateral akinesis; inferoseptal hypokinesis; diastolic function could not be determined  - Compensated    Paroxysmal atrial fibrillation  - CHADS-VASc is at least 2 (CHF, CAD); also has history of PE in 2012  - Continue to hold anticoagulation with Xarelto - Heart rate controlled with metoprolol  CAD - Has stent to RCA  - Continue aspirin daily  Alcohol abuse  - EtOH <5 on admission  - No withdrawals   Liver disease with ascites - CT demonstrates hepatic steatosis with moderate ascites  - No evidence of decompensation - Continue protonix daily - Continue multivitamin and folic acid     DVT prophylaxis: On full dose anticoagulation with xarelto  Code Status: full code  Family Communication: no family at the bedside this am; tried calling patient's spuse over the phone home phone number but no answer Disposition Plan: Patient medically stable for discharge, awaiting social work input whether patient can go to skilled nursing facility   Consultants:   Physical therapy  Procedures:   None   Antimicrobials:   None    Subjective: Feels better this morning.  Objective: Filed Vitals:   04/29/16 2257 04/30/16 0400 04/30/16 0955 04/30/16 0958  BP: 106/71 118/76 100/70 113/87  Pulse: 91 88 102 101  Temp:  98.1 F (36.7 C)  TempSrc:      Resp:  20    Height:      Weight:  88.724 kg (195 lb 9.6 oz)    SpO2:  100%      Intake/Output Summary (Last 24 hours) at 04/30/16 1111 Last data filed  at 04/30/16 0900  Gross per 24 hour  Intake    480 ml  Output    350 ml  Net    130 ml   Filed Weights   04/28/16 0635 04/29/16 0614 04/30/16 0400  Weight: 87.227 kg (192 lb 4.8 oz) 87.363 kg (192 lb 9.6 oz) 88.724 kg (195 lb 9.6 oz)    Examination:  General exam: Comment comfortable Respiratory system: No wheezing, no rhonchi Cardiovascular system: S1 & S2 heard, rate controlled   Gastrointestinal system: (+) BS, non tender, non-distended abdomen Central nervous system: Nonfocal Extremities: Trace edema, pulses palpable Skin: Warm and dry  Psychiatry: No agitation or restlessness   Data Reviewed: I have personally reviewed following labs and imaging studies  CBC:  Recent Labs Lab 04/27/16 1752 04/29/16 0446 04/30/16 0416  WBC 8.4 8.1 8.7  NEUTROABS 5.9  --   --   HGB 8.6* 7.2* 7.7*  HCT 26.1* 22.3* 24.0*  MCV 98.5 97.0 99.2  PLT 135* 127* [id number]   Basic Metabolic Panel:  Recent Labs Lab 04/27/16 1752 04/27/16 2218 04/28/16 1134 04/29/16 0446 04/29/16 1153 04/30/16 0416  NA 130*  --   --  131*  --  136  K 2.8*  --  2.8* 2.8* 3.2* 3.6  CL 95*  --   --  99*  --  102  CO2 23  --   --  26  --  29  GLUCOSE 96  --   --  98  --  106*  BUN 10  --   --  9  --  6  CREATININE 1.20  --   --  1.03  --  0.86  CALCIUM 7.2*  --   --  7.8*  --  8.1*  MG  --  1.2*  --  1.7  --   --    GFR: Estimated Creatinine Clearance: 105.3 mL/min (by C-G formula based on Cr of 0.86). Liver Function Tests:  Recent Labs Lab 04/27/16 1752  AST 61*  ALT 35  ALKPHOS 191*  BILITOT 2.9*  PROT 5.9*  ALBUMIN 1.5*    Recent Labs Lab 04/27/16 1752  LIPASE 19   No results for input(s): AMMONIA in the last 168 hours. Coagulation Profile: No results for input(s): INR, PROTIME in the last 168 hours. Cardiac Enzymes: No results for input(s): CKTOTAL, CKMB, CKMBINDEX, TROPONINI in the last 168 hours. BNP (last 3 results) No results for input(s): PROBNP in the last 8760  hours. HbA1C: No results for input(s): HGBA1C in the last 72 hours. CBG:  Recent Labs Lab 04/28/16 0602 04/28/16 2100 04/29/16 0616 04/29/16 1253 04/30/16 0712  GLUCAP 96 147* 100* 138* 101*   Lipid Profile: No results for input(s): CHOL, HDL, LDLCALC, TRIG, CHOLHDL, LDLDIRECT in the last 72 hours. Thyroid Function Tests: No results for input(s): TSH, T4TOTAL, FREET4, T3FREE, THYROIDAB in the last 72 hours. Anemia Panel:  Recent Labs  04/28/16 0100  VITAMINB12 1365*  FERRITIN 760*  TIBC NOT CALCULATED  IRON 71   Urine analysis:    Component Value Date/Time   COLORURINE AMBER* 04/27/2016 1833   APPEARANCEUR HAZY* 04/27/2016 1833   LABSPEC 1.024 04/27/2016 1833   PHURINE 5.5 04/27/2016 1833  GLUCOSEU NEGATIVE 04/27/2016 1833   HGBUR NEGATIVE 04/27/2016 1833   BILIRUBINUR LARGE* 04/27/2016 1833   KETONESUR 15* 04/27/2016 1833   PROTEINUR 30* 04/27/2016 1833   NITRITE POSITIVE* 04/27/2016 1833   LEUKOCYTESUR SMALL* 04/27/2016 1833   Sepsis Labs: @LABRCNTIP (procalcitonin:4,lacticidven:4)   Recent Results (from the past 240 hour(s))  Urine culture     Status: Abnormal   Collection Time: 04/27/16  6:33 PM  Result Value Ref Range Status   Specimen Description URINE, CLEAN CATCH  Final   Special Requests NONE  Final   Culture 2,000 COLONIES/mL INSIGNIFICANT GROWTH (A)  Final   Report Status 04/29/2016 FINAL  Final  Blood Culture (routine x 2)     Status: None (Preliminary result)   Collection Time: 04/27/16  8:11 PM  Result Value Ref Range Status   Specimen Description BLOOD LEFT ANTECUBITAL  Final   Special Requests BOTTLES DRAWN AEROBIC AND ANAEROBIC 5CC  Final   Culture NO GROWTH 2 DAYS  Final   Report Status PENDING  Incomplete  Blood Culture (routine x 2)     Status: None (Preliminary result)   Collection Time: 04/27/16 10:18 PM  Result Value Ref Range Status   Specimen Description BLOOD LEFT ANTECUBITAL  Final   Special Requests BOTTLES DRAWN AEROBIC  ONLY 6CC  Final   Culture NO GROWTH 2 DAYS  Final   Report Status PENDING  Incomplete      Radiology Studies: Ct Abdomen Pelvis W Contrast 04/27/2016  1. Hepatic steatosis. Moderate amount of ascites. This appearance can be seen with nonalcoholic steatohepatitis or cirrhosis. 2. No bowel obstruction.   Dg Abd Acute W/chest 04/27/2016  1. No evidence for free air. 2. Dilated large and small bowel loops most consistent with ileus pattern. An early small bowel obstruction would be difficult to exclude.    Scheduled Meds: . aspirin EC  81 mg Oral QODAY  . calcium carbonate  1,250 mg Oral BID WC  . cholecalciferol  1,000 Units Oral Daily  . feeding supplement (ENSURE ENLIVE)  237 mL Oral BID BM  . folic acid  1 mg Oral Daily  . LORazepam  0-4 mg Oral Q12H  . metoprolol tartrate  25 mg Oral BID  . multivitamin with minerals  1 tablet Oral Daily  . pantoprazole  40 mg Oral Daily  . rivaroxaban  20 mg Oral Q supper  . sodium chloride flush  3 mL Intravenous Q12H  . sodium chloride flush  3 mL Intravenous Q12H  . spironolactone  12.5 mg Oral Daily  . sucralfate  1 g Oral TID WC & HS  . thiamine  100 mg Oral Daily   Continuous Infusions:       Time spent: 15 minutes  Greater than 50% of the time spent on counseling and coordinating the care.   Leisa Lenz, MD [facility] Pager [phone]  If 7PM-7AM, please contact night-coverage [email or web address] Password [email or web address] 04/30/2016, 11:11 AM

## 2016-05-01 ENCOUNTER — Telehealth: Payer: Self-pay

## 2016-05-01 DIAGNOSIS — I480 Paroxysmal atrial fibrillation: Secondary | ICD-10-CM

## 2016-05-01 DIAGNOSIS — I951 Orthostatic hypotension: Secondary | ICD-10-CM | POA: Diagnosis not present

## 2016-05-01 DIAGNOSIS — I10 Essential (primary) hypertension: Secondary | ICD-10-CM | POA: Diagnosis not present

## 2016-05-01 DIAGNOSIS — F1010 Alcohol abuse, uncomplicated: Secondary | ICD-10-CM | POA: Diagnosis not present

## 2016-05-01 DIAGNOSIS — D638 Anemia in other chronic diseases classified elsewhere: Secondary | ICD-10-CM | POA: Diagnosis not present

## 2016-05-01 LAB — FOLATE RBC
FOLATE, HEMOLYSATE: 304.7 ng/mL
Folate, RBC: 1336 ng/mL (ref >498)
HEMATOCRIT: 22.8 % — AB (ref 37.5–51.0)

## 2016-05-01 LAB — BASIC METABOLIC PANEL WITH GFR
Anion gap: 4 — ABNORMAL LOW (ref 5–15)
BUN: 9 mg/dL (ref 6–20)
CO2: 30 mmol/L (ref 22–32)
Calcium: 8 mg/dL — ABNORMAL LOW (ref 8.9–10.3)
Chloride: 104 mmol/L (ref 101–111)
Creatinine, Ser: 0.83 mg/dL (ref 0.61–1.24)
GFR calc Af Amer: >60 mL/min
GFR calc non Af Amer: >60 mL/min
Glucose, Bld: 100 mg/dL — ABNORMAL HIGH (ref 65–99)
Potassium: 3.8 mmol/L (ref 3.5–5.1)
Sodium: 138 mmol/L (ref 135–145)

## 2016-05-01 LAB — CBC
HCT: 23 % — ABNORMAL LOW (ref 39.0–52.0)
Hemoglobin: 7.3 g/dL — ABNORMAL LOW (ref 13.0–17.0)
MCH: 32.7 pg (ref 26.0–34.0)
MCHC: 31.7 g/dL (ref 30.0–36.0)
MCV: 103.1 fL — ABNORMAL HIGH (ref 78.0–100.0)
Platelets: 153 K/uL (ref 150–400)
RBC: 2.23 MIL/uL — ABNORMAL LOW (ref 4.22–5.81)
RDW: 22.6 % — ABNORMAL HIGH (ref 11.5–15.5)
WBC: 8.1 K/uL (ref 4.0–10.5)

## 2016-05-01 LAB — GLUCOSE, CAPILLARY: Glucose-Capillary: 122 mg/dL — ABNORMAL HIGH (ref 65–99)

## 2016-05-01 LAB — ABO/RH: ABO/RH(D): A POS

## 2016-05-01 LAB — PROCALCITONIN: Procalcitonin: 0.41 ng/mL

## 2016-05-01 LAB — PREPARE RBC (CROSSMATCH)

## 2016-05-01 MED ORDER — ENSURE ENLIVE PO LIQD: 237.0000 mL | Freq: Two times a day (BID) | ORAL | Status: AC

## 2016-05-01 MED ORDER — SODIUM CHLORIDE 0.9 % IV SOLN
Freq: Once | INTRAVENOUS | Status: AC
  Administered 2016-05-01: 250 mL via INTRAVENOUS

## 2016-05-01 MED ORDER — METOPROLOL TARTRATE 25 MG PO TABS: 25.0000 mg | ORAL_TABLET | Freq: Two times a day (BID) | ORAL | Status: AC

## 2016-05-01 NOTE — Discharge Summary (Signed)
Physician Discharge Summary  ANTOHNY [name] [id number] DOB: [birth year] DOA: 04/27/2016  PCP: [name], MD  Admit date: 04/27/2016 Discharge date: 05/01/2016  Recommendations for Outpatient Follow-up:  Home health orders placed as patient declined skilled nursing facility placement. Please continue to hold xarelto for at least another 1 week on discharge, follow-up with primary care physician who can recheck your hemoglobin and make sure that it is stable prior to resuming xarelto. 1 unit of PRBC transfusion was given prior to discharge. Please note that we stopped bisoprolol and we prescribed metoprolol 25 mg twice daily for heart rate control.  Discharge Diagnoses:  Principal Problem:   Orthostasis Active Problems:   Essential hypertension   CAD, NATIVE VESSEL   SYSTOLIC HEART FAILURE, CHRONIC   Cardiomyopathy, ischemic   Alcohol abuse   Hypokalemia   Thrombocytopenia (HCC)   Hyponatremia   Hypocalcemia   Anemia, chronic disease   AKI (acute kidney injury) ([place])   Sinus tachycardia (HCC)   Liver disease, chronic   Paroxysmal atrial fibrillation (HCC)   UTI (lower urinary tract infection)    Discharge Condition: stable; Patient insists on going home today. He does not want to go to skilled nursing facility. He is alert and oriented to time, place and person.  Diet recommendation: as tolerated   History of present illness:  55 y.o. male with past medical history significant for ischemic cardiomyopathy with ICD, paroxysmal atrial fibrillation on Xarelto, anemia of chronic disease, GERD with recent diagnosis of esophageal ulcers and alcohol abuse with chronic liver disease who presented to ED from cardiology office for evaluation of tachycardia (HR in 140's range) and orthostasis. Patient was recently hospitalized from 03/16/2016 to 03/20/2016 with hypotension and tachycardia, ruled out for sepsis, treated for dehydration and discharged in stable condition.    Patient was hemodynamically stable on the admission. His oxygen saturation was 92% on room air. His heart rate was 140. At the 12-lead EKG shows Q waves in inferior leads, old, sinus Tachycardia. Abdominal x-ray showed no evidence of free air. CT abdomen showed hepatic stat ptosis with moderate amount of ascites. Ethanol level was undetectable. Lipase was within normal limits. AST was 61 and alkaline phosphatase 191 with total bilirubin 2.9. Potassium was 2.8 and was supplemented. UDS was positive for THC. He was admitted for further evaluation of tachycardia and orthostasis.  Hospital Course:    Assessment & Plan:  Orthostasis, tachycardia  - Patient has sinus tachycardia on 12-lead EKG. No reports of chest pain. - Alcohol level was within normal limits, UDS positive for THC - No evidence of acute infectious process. UTI has small leukocytes but patient has no urinary symptoms such as dysuria or hematuria or urinary frequency or urgency - Changed bisoprolol to metoprolol for better HR control.  - Patient declined skilled nursing facility placement. Home health orders placed.   Anemia of chronic disease / thrombocytopenia - Related to bone marrow suppression from alcohol abuse - Hemoglobin further down to 7.3 this morning. We'll stop anticoagulation, aspirin. Patient instructed to hold those medications for 1 week discharge due to risk of bleeding. He can follow-up with primary care physician in one week, recheck hemoglobin and if at least 8 or above he may resume those medications.  Hyponatremia, hypokalemia, hypomagnesemia - Potassium within normal limits in sodium 130 - Likely related to hepatic steatosis, history of alcohol use - Supplemented magnesium and potassium. - Potassium within normal limits, magnesium within normal limits  Chronic systolic CHF - TTE (123456) with EF  30-35%; mild LVH; inferolateral akinesis; inferoseptal hypokinesis; diastolic function could not be  determined  - Compensated   Paroxysmal atrial fibrillation  - CHADS-VASc is at least 2 (CHF, CAD); also has history of PE in 2012  - he was on [facility] with xarelto but his Hgb is not trending up - He will get 1 U pRBC today - Recommended to hold anticoagulation for 1 week, recheck hemoglobin level and then resume if hemoglobin at least 8 or above. - Heart rate controlled with metoprolol  CAD - Has stent to RCA  - As noted above, patient instructed to hold aspirin and anticoagulation for 1 week due to risk of bleeding. His hemoglobin is 7.3 this morning. It was 7.7 yesterday. He will get 1 unit of PRBC prior to transfusion.  Alcohol abuse  - EtOH <5 on admission  - No withdrawals   Liver disease with ascites - CT demonstrates hepatic steatosis with moderate ascites  - No evidence of decompensation - Continue omeprazole daily - Continue multivitamin and folic acid     DVT prophylaxis: On full dose anticoagulation with xarelto  Code Status: full code  Family Communication: no family at the bedside this am; tried calling patient's spouse over the phone, no VM on home phone but left message on VM cell phone for her to call me on my cell phone, i told her that he is ready for discharge today   Consultants:   Physical therapy  Procedures:   None  Antimicrobials:   None   Signed:  Leisa Lenz, MD  [facility] 05/01/2016, 11:38 AM  Pager #: [phone]  Time spent in minutes: more than 30 minutes   Discharge Exam: Filed Vitals:   05/01/16 0900 05/01/16 0941  BP: 105/77 105/77  Pulse: 92 92  Temp:    Resp:     Filed Vitals:   04/30/16 2353 05/01/16 0700 05/01/16 0900 05/01/16 0941  BP: 113/76 117/81 105/77 105/77  Pulse: 97 94 92 92  Temp: 98.9 F (37.2 C) 97.5 F (36.4 C)    TempSrc: Oral Oral    Resp: 20 20    Height:      Weight:  90.765 kg (200 lb 1.6 oz)    SpO2: 99% 100%      General: Pt is alert, follows commands appropriately,  not in acute distress Cardiovascular: Regular rate and rhythm, S1/S2 + Respiratory: Clear to auscultation bilaterally, no wheezing, no crackles, no rhonchi Abdominal: Soft, non tender, non distended, bowel sounds +, no guarding Extremities: no edema, no cyanosis, pulses palpable bilaterally DP and PT Neuro: Grossly nonfocal  Discharge Instructions  Discharge Instructions    Call MD for:  difficulty breathing, headache or visual disturbances    Complete by:  As directed      Call MD for:  persistant dizziness or light-headedness    Complete by:  As directed      Call MD for:  persistant nausea and vomiting    Complete by:  As directed      Call MD for:  severe uncontrolled pain    Complete by:  As directed      Diet - low sodium heart healthy    Complete by:  As directed      Discharge instructions    Complete by:  As directed   Home health orders placed as patient declined skilled nursing facility placement. Please continue to hold aspirin and xarelto for at least another 1 week on discharge, follow-up with primary care  physician who can recheck your hemoglobin and make sure that it is stable prior to resuming xarelto. 1 unit of PRBC transfusion was given prior to discharge. Please note that we stopped bisoprolol and we prescribed metoprolol 25 mg twice daily for heart rate control.     Increase activity slowly    Complete by:  As directed             Medication List    STOP taking these medications        aspirin 81 MG EC tablet     bisoprolol 5 MG tablet  Commonly known as:  ZEBETA     pantoprazole 40 MG tablet  Commonly known as:  PROTONIX     rivaroxaban 20 MG Tabs tablet  Commonly known as:  XARELTO      TAKE these medications        acetaminophen 325 MG tablet  Commonly known as:  TYLENOL  Take 650 mg by mouth every 6 (six) hours as needed for moderate pain.     calcium carbonate 1250 (500 Ca) MG chewable tablet  Commonly known as:  OS-CAL  Chew 1 tablet  (1,250 mg total) by mouth 2 (two) times daily.     cholecalciferol 1000 units tablet  Commonly known as:  VITAMIN D  Take 1,000 Units by mouth daily.     feeding supplement (ENSURE ENLIVE) Liqd  Take 237 mLs by mouth 2 (two) times daily between meals.     folic acid 1 MG tablet  Commonly known as:  FOLVITE  Take 1 mg by mouth daily.     magnesium oxide 400 (241.3 Mg) MG tablet  Commonly known as:  MAG-OX  Take 2 tablets (800 mg total) by mouth 3 (three) times daily.     metoprolol tartrate 25 MG tablet  Commonly known as:  LOPRESSOR  Take 1 tablet (25 mg total) by mouth 2 (two) times daily.     nitroGLYCERIN 0.4 MG SL tablet  Commonly known as:  NITROSTAT  Place 0.4 mg under the tongue every 5 (five) minutes as needed for chest pain.     omeprazole 40 MG capsule  Commonly known as:  PRILOSEC  Take 40 mg by mouth daily.     Potassium Chloride ER 20 MEQ Tbcr  Take 40 mEq by mouth 2 (two) times daily.     saccharomyces boulardii 250 MG capsule  Commonly known as:  FLORASTOR  Take 1 capsule (250 mg total) by mouth 2 (two) times daily.     spironolactone 25 MG tablet  Commonly known as:  ALDACTONE  Take 0.5 tablets (12.5 mg total) by mouth daily.     sucralfate 1 GM/10ML suspension  Commonly known as:  CARAFATE  Take 10 mLs (1 g total) by mouth 4 (four) times daily -  with meals and at bedtime.     thiamine 100 MG tablet  Take 1 tablet (100 mg total) by mouth daily.     tiotropium 18 MCG inhalation capsule  Commonly known as:  SPIRIVA  Place 18 mcg into inhaler and inhale daily as needed (shortness of breath).            Follow-up Information    Follow up with [name], MD. Schedule an appointment as soon as possible for a visit in 1 week.   Specialty:  Family Medicine   Why:  Follow up appt after recent hospitalization, check CBC   Contact information:   [place]  [phone]        The results of significant  diagnostics from this hospitalization (including imaging, microbiology, ancillary and laboratory) are listed below for reference.    Significant Diagnostic Studies: Ct Abdomen Pelvis W Contrast  04/27/2016  CLINICAL DATA:  Abdominal pain for 4 days, vomiting EXAM: CT ABDOMEN AND PELVIS WITH CONTRAST TECHNIQUE: Multidetector CT imaging of the abdomen and pelvis was performed using the standard protocol following bolus administration of intravenous contrast. CONTRAST:  128mL ISOVUE-300 IOPAMIDOL (ISOVUE-300) INJECTION 61% COMPARISON:  CT abdomen 10/06/2015 FINDINGS: Lower chest:  Lung bases are clear.  Normal heart size. Hepatobiliary: Diffuse low attenuation of the liver consistent with hepatic steatosis. Small amount of perihepatic ascites. Moderate amount of perisplenic ascites. Cholelithiasis. Pancreas: Normal. Spleen: Normal. Adrenals/Urinary Tract: Normal adrenal glands. Normal kidneys. No obstructive uropathy. Normal bladder. Stomach/Bowel: No bowel dilatation or bowel wall thickening. No pneumatosis, pneumoperitoneum or portal venous gas. Moderate amount of abdominal ascites. Small hiatal hernia. Vascular/Lymphatic: Normal caliber abdominal aorta with atherosclerosis. Other: No focal fluid collection or hematoma. Musculoskeletal: No lytic or sclerotic osseous lesion. No acute osseous abnormality. IMPRESSION: 1. Hepatic steatosis. Moderate amount of ascites. This appearance can be seen with nonalcoholic steatohepatitis or cirrhosis. 2. No bowel obstruction. Electronically Signed   By: Kathreen Devoid   On: 04/27/2016 21:34   Dg Abd Acute W/chest  04/27/2016  CLINICAL DATA:  Center chest pain, epigastric pain, nausea without emesis, cough x3 days. Hx of CHF, HTN, pulmonary embolism, GERD, alcohol abuse. EXAM: DG ABDOMEN ACUTE W/ 1V CHEST COMPARISON:  01/18/2016 FINDINGS: Heart size normal. Lungs are clear. No focal consolidations or pleural effusions. Left-sided pacemaker leads overlying the right ventricle.  There is no free intraperitoneal air beneath diaphragm. Supine and decubitus views of the abdomen demonstrate dilated small bowel loops. Gas is identified within mildly prominent loops of large bowel. Visualized osseous structures have a normal appearance. The IMPRESSION: 1. No evidence for free air. 2. Dilated large and small bowel loops most consistent with ileus pattern. An early small bowel obstruction would be difficult to exclude. Electronically Signed   By: Nolon Nations M.D.   On: 04/27/2016 18:47    Microbiology: Recent Results (from the past 240 hour(s))  Urine culture     Status: Abnormal   Collection Time: 04/27/16  6:33 PM  Result Value Ref Range Status   Specimen Description URINE, CLEAN CATCH  Final   Special Requests NONE  Final   Culture 2,000 COLONIES/mL INSIGNIFICANT GROWTH (A)  Final   Report Status 04/29/2016 FINAL  Final  Blood Culture (routine x 2)     Status: None (Preliminary result)   Collection Time: 04/27/16  8:11 PM  Result Value Ref Range Status   Specimen Description BLOOD LEFT ANTECUBITAL  Final   Special Requests BOTTLES DRAWN AEROBIC AND ANAEROBIC 5CC  Final   Culture NO GROWTH 3 DAYS  Final   Report Status PENDING  Incomplete  Blood Culture (routine x 2)     Status: None (Preliminary result)   Collection Time: 04/27/16 10:18 PM  Result Value Ref Range Status   Specimen Description BLOOD LEFT ANTECUBITAL  Final   Special Requests BOTTLES DRAWN AEROBIC ONLY 6CC  Final   Culture NO GROWTH 3 DAYS  Final   Report Status PENDING  Incomplete     Labs: Basic Metabolic Panel:  Recent Labs Lab 04/27/16 1752 04/27/16 2218 04/28/16 1134 04/29/16 0446 04/29/16 1153 04/30/16 0416 05/01/16 0541  NA 130*  --   --  131*  --  136 138  K 2.8*  --  2.8* 2.8* 3.2* 3.6 3.8  CL 95*  --   --  99*  --  102 104  CO2 23  --   --  26  --  29 30  GLUCOSE 96  --   --  98  --  106* 100*  BUN 10  --   --  9  --  6 9  CREATININE 1.20  --   --  1.03  --  0.86 0.83   CALCIUM 7.2*  --   --  7.8*  --  8.1* 8.0*  MG  --  1.2*  --  1.7  --   --   --    Liver Function Tests:  Recent Labs Lab 04/27/16 1752  AST 61*  ALT 35  ALKPHOS 191*  BILITOT 2.9*  PROT 5.9*  ALBUMIN 1.5*    Recent Labs Lab 04/27/16 1752  LIPASE 19   No results for input(s): AMMONIA in the last 168 hours. CBC:  Recent Labs Lab 04/27/16 1752 04/29/16 0446 04/30/16 0416 05/01/16 0541  WBC 8.4 8.1 8.7 8.1  NEUTROABS 5.9  --   --   --   HGB 8.6* 7.2* 7.7* 7.3*  HCT 26.1* 22.3* 24.0* 23.0*  MCV 98.5 97.0 99.2 103.1*  PLT 135* 127* 150 153   Cardiac Enzymes: No results for input(s): CKTOTAL, CKMB, CKMBINDEX, TROPONINI in the last 168 hours. BNP: BNP (last 3 results)  Recent Labs  01/18/16 1528 03/16/16 1822 04/27/16 2218  BNP 163.0* 168.1* 189.8*    ProBNP (last 3 results) No results for input(s): PROBNP in the last 8760 hours.  CBG:  Recent Labs Lab 04/28/16 2100 04/29/16 0616 04/29/16 1253 04/30/16 0712 05/01/16 0548  GLUCAP 147* 100* 138* 101* 122*

## 2016-05-01 NOTE — Telephone Encounter (Signed)
Xarelto approved through 11/05/2016. [id number].

## 2016-05-01 NOTE — Progress Notes (Signed)
Instructed by off going nurse pt to be d/c'd after 1 unit of blood transfusion given.  Pt's wife notified after blood transfusion and instructed that she is not able to come get pt on d/c as she has to work in the am and will ask his daughter to pick him up tomorrow.  Notified Tylene Fantasia PA via text.  Informed incoming nurse and to f/u if no return call from PA.  Briggitte Boline,RN/

## 2016-05-01 NOTE — Care Management Note (Signed)
Case Management Note  Patient Details  Name: Stephen [name] MRN: [id number] Date of Birth: January 30, 1961  Subjective/Objective:      Admitted with Orthrostasis              Action/Plan: Patient lives at home with spouse. Physical Therapy is recommending SNF placement but patient refused, stated that he was at [facility] for 20 days and was discharged home  With [place] ( [facility] with [facility] called and is aware of admission; He has a walker and 3:1 at home;   Expected Discharge Date:    possibly 05/01/2016              Expected Discharge Plan:  [place]  In-House Referral:  Clinical Social Work  Discharge planning Services  CM Consult     Choice offered to:  Patient  HH Arranged:  PT, OT, Nurse's Aide, RN [place] Agency:  [facility] ([facility]  Status of Service:  In process, will continue to follow  Sherrilyn Rist [id number] 05/01/2016, 11:04 AM

## 2016-05-01 NOTE — Progress Notes (Signed)
Patient educated on signs and symptoms of reaction during blood transfusion and to notify nurse Neta Mends Rn 3:16 PM 05-01-2016

## 2016-05-01 NOTE — Progress Notes (Addendum)
Physical Therapy Treatment Patient Details Name: Stephen [name] MRN: [id number] DOB: [birth year] Today's Date: 05/01/2016    History of Present Illness 55 y.o. male with past medical history significant for ischemic cardiomyopathy with ICD, paroxysmal atrial fibrillation on Xarelto, anemia of chronic disease, GERD with recent diagnosis of esophageal ulcers and alcohol abuse with chronic liver disease who presented to ED from cardiology office for evaluation of tachycardia (HR in 140's range) and orthostasis.     PT Comments    Pt does not recall session from yesterday where he ambulated and did stair training.  Orthostatics performed and then pt abruptly returned supine and refused to participate with any more PT.  Con't to recommend SNF, although if he refuses will need HHPT and RW and 3/1 BSC. Supine BP 93/60 Sitting BP 101/73 Standing BP 118/105   Follow Up Recommendations  SNF;Other (comment) (if refuses, needs HHPT)     Equipment Recommendations  Rolling walker with 5" wheels;3in1 (PT)    Recommendations for Other Services       Precautions / Restrictions Precautions Precautions: Fall Restrictions Weight Bearing Restrictions: No    Mobility  Bed Mobility   Bed Mobility: Supine to Sit     Supine to sit: Supervision     General bed mobility comments: slowly and with effort, but no physical A  Transfers Overall transfer level: Needs assistance Equipment used: Rolling walker (2 wheeled) Transfers: Sit to/from Stand Sit to Stand: Min guard         General transfer comment: cues for safety with initial standing. Pt c/o dizziness. Sat after orthostatic BP taken and without warning he returned supine and declined to do anything else  Ambulation/Gait                 Stairs            Wheelchair Mobility    Modified Rankin (Stroke Patients Only)       Balance     Sitting balance-Leahy Scale: Fair       Standing balance-Leahy Scale:  Poor Standing balance comment: requires RW                    Cognition Arousal/Alertness: Awake/alert Behavior During Therapy: Restless Overall Cognitive Status: Impaired/Different from baseline Area of Impairment: Safety/judgement         Safety/Judgement: Decreased awareness of safety;Decreased awareness of deficits          Exercises      General Comments General comments (skin integrity, edema, etc.): Nurse in during session and reports pt scheduled to get unit of blood      Pertinent Vitals/Pain Pain Assessment: No/denies pain    Home Living                      Prior Function            PT Goals (current goals can now be found in the care plan section) Acute Rehab PT Goals Patient Stated Goal: did not state PT Goal Formulation: With patient Time For Goal Achievement: 05/06/16 Potential to Achieve Goals: Good Progress towards PT goals: Not progressing toward goals - comment (refused ambulation)    Frequency  Min 3X/week    PT Plan Current plan remains appropriate    Co-evaluation             End of Session Equipment Utilized During Treatment: Gait belt Activity Tolerance: Patient limited by fatigue Patient left: in bed;with bed alarm  set;with call bell/phone within reach     Time: 1015-1030 PT Time Calculation (min) (ACUTE ONLY): 15 min  Charges:  $Therapeutic Activity: 8-22 mins                    G Codes:      Stephen [name] 05/01/2016, 12:48 PM

## 2016-05-02 LAB — CULTURE, BLOOD (ROUTINE X 2)
CULTURE: NO GROWTH
Culture: NO GROWTH

## 2016-05-02 LAB — TYPE AND SCREEN
ABO/RH(D): A POS
ANTIBODY SCREEN: NEGATIVE
Unit division: 0

## 2016-05-02 LAB — GLUCOSE, CAPILLARY: GLUCOSE-CAPILLARY: 82 mg/dL (ref 65–99)

## 2016-05-02 NOTE — Progress Notes (Signed)
Discharge instructions ans prescriptions given to pt and daughter . Both verbalized understanding. Wheeled to lobby by NT

## 2016-05-02 NOTE — Progress Notes (Signed)
Physical Therapy Treatment Patient Details Name: Stephen [name] MRN: [id number] DOB: Jun 03, 1961 Today's Date: 05/02/2016    History of Present Illness 55 y.o. male with past medical history significant for ischemic cardiomyopathy with ICD, paroxysmal atrial fibrillation on Xarelto, anemia of chronic disease, GERD with recent diagnosis of esophageal ulcers and alcohol abuse with chronic liver disease who presented to ED from cardiology office for evaluation of tachycardia (HR in 140's range) and orthostasis.     PT Comments    As evaluating orthostatic BPs, noted BP elevated appropriately however pt reporting dizziness. With further questions (symptoms x 6-7 months after a fall, feeling off balance for seconds when he turns quickly, can decrease sense of imbalance with fixing his eyes on a target), initiated a vestibular evaluation. Patient with + Rt horizontal canalithiasis and performed BBQ roll and Appiani maneuvers to reposition otoliths. Patient with +vertigo and nausea as moved through positions. Will return in ~2 hours to further assess mobility (after vestibular system calms down).    Follow Up Recommendations  Home health PT;Supervision for mobility/OOB (Vestibular Rehab)     Equipment Recommendations  None recommended by PT    Recommendations for Other Services   none    05/02/16 1101  Vestibular Assessment  General Observation pt reports sense of imbalance with quick turns and bending forward  Symptom Behavior  Type of Dizziness Imbalance  Frequency of Dizziness daily with certain movements  Duration of Dizziness seconds  Aggravating Factors Turning body quickly;Turning head quickly;Forward bending  Occulomotor Exam  Occulomotor Alignment Normal (pt denies double-vision, but has it sometimes)  Spontaneous Absent  Gaze-induced Absent  Smooth Pursuits Saccades  Saccades Poor trajectory  Positional Testing  Dix-Hallpike Dix-Hallpike Right;Dix-Hallpike Left  Horizontal  Canal Testing Horizontal Canal Right;Horizontal Canal Left;Horizontal Canal Right Intensity;Horizontal Canal Left Intensity  Dix-Hallpike Right  Dix-Hallpike Right Duration 0  Dix-Hallpike Right Symptoms No nystagmus  Dix-Hallpike Left  Dix-Hallpike Left Duration 0  Dix-Hallpike Left Symptoms No nystagmus  Horizontal Canal Right  Horizontal Canal Right Duration 5 seconds (after 2 second delay)  Horizontal Canal Right Symptoms Geotrophic  Horizontal Canal Left  Horizontal Canal Left Duration 0  Horizontal Canal Left Symptoms Normal  Horizontal Canal Right Intensity  Horizontal Canal Right Intensity Moderate  Cognition  Cognition Orientation Level Oriented x 4  Orthostatics  Orthostatics Comment see flowsheets     Precautions / Restrictions Precautions Precautions: Fall    Mobility  Bed Mobility Overal bed mobility: Modified Independent Bed Mobility: Sidelying to Sit;Sit to Sidelying;Rolling Rolling: Modified independent (Device/Increase time) Sidelying to sit: Min guard     Sit to sidelying: Supervision General bed mobility comments: minguard due to dizziness  Transfers                 General transfer comment: NT due to + vestibular signs and proceeded with vestibular evaluation  Ambulation/Gait                 Stairs            Wheelchair Mobility    Modified Rankin (Stroke Patients Only)       Balance                                    Cognition Arousal/Alertness: Awake/alert Behavior During Therapy: Flat affect Overall Cognitive Status: Within Functional Limits for tasks assessed  Exercises      General Comments        Pertinent Vitals/Pain Pain Assessment: No/denies pain    Home Living                      Prior Function            PT Goals (current goals can now be found in the care plan section) Acute Rehab PT Goals Patient Stated Goal: stop feeling dizzy and  falling Time For Goal Achievement: 05/06/16 Progress towards PT goals: Not progressing toward goals - comment (session focused on vestibular eval)    Frequency  Min 3X/week    PT Plan Current plan remains appropriate    Co-evaluation             End of Session   Activity Tolerance: Treatment limited secondary to medical complications (Comment) (vertigo) Patient left: in bed;with call bell/phone within reach;Other (comment) (instructed to avoid sudden head movement and to not lie falt)     Time: [id number] PT Time Calculation (min) (ACUTE ONLY): 53 min  Charges:  $Therapeutic Activity: 23-37 mins $Canalith Rep Proc: 8-22 mins $Physical Performance Test: 8-22 mins                    G Codes:      Bryor Rami 19-May-2016, 11:13 AM Pager [phone]

## 2016-05-02 NOTE — Progress Notes (Signed)
Physical Therapy Treatment Patient Details Name: Stephen [name] MRN: [id number] DOB: 01-23-1961 Today's Date: 05/02/2016    History of Present Illness 55 y.o. male with past medical history significant for ischemic cardiomyopathy with ICD, paroxysmal atrial fibrillation on Xarelto, anemia of chronic disease, GERD with recent diagnosis of esophageal ulcers and alcohol abuse with chronic liver disease who presented to ED from cardiology office for evaluation of tachycardia (HR in 140's range) and orthostasis.     PT Comments    Patient able to walk with RW and no symptoms of imbalance/vertigo. Able to perform head turns and 180 degree turns with no symptoms. 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 (Did report episodes of double vision that come and go).    Follow Up Recommendations  Home health PT;Supervision for mobility/OOB (Vestibular Rehab)     Equipment Recommendations  None recommended by PT    Recommendations for Other Services       Precautions / Restrictions Precautions Precautions: Fall Restrictions Weight Bearing Restrictions: No    Mobility  Bed Mobility Overal bed mobility: Modified Independent Bed Mobility: Sidelying to Sit;Sit to Sidelying;Rolling Rolling: Modified independent (Device/Increase time) Sidelying to sit: Min guard     Sit to sidelying: Supervision General bed mobility comments: slowly and with effort, but no physical A  Transfers Overall transfer level: Needs assistance Equipment used: Rolling walker (2 wheeled) Transfers: Sit to/from Stand Sit to Stand: Min guard         General transfer comment: vc for proper use of RW; slightly unsteady do to RW moving; denied feeling off-balance  Ambulation/Gait Ambulation/Gait assistance: Min guard;Modified independent (Device/Increase time) Ambulation Distance (Feet): 400 Feet Assistive device: Rolling walker (2  wheeled) Gait Pattern/deviations: Step-through pattern;Decreased stride length Gait velocity: slow Gait velocity interpretation: Below normal speed for age/gender General Gait Details: vc for proximity to RW; head turns; turn 180    Stairs            Wheelchair Mobility    Modified Rankin (Stroke Patients Only)       Balance     Sitting balance-Leahy Scale: Good       Standing balance-Leahy Scale: Poor                      Cognition Arousal/Alertness: Awake/alert Behavior During Therapy: Flat affect Overall Cognitive Status: Within Functional Limits for tasks assessed                      Exercises      General Comments        Pertinent Vitals/Pain Pain Assessment: No/denies pain    Home Living                      Prior Function            PT Goals (current goals can now be found in the care plan section) Acute Rehab PT Goals Patient Stated Goal: stop feeling dizzy and falling Time For Goal Achievement: 05/06/16 Progress towards PT goals: Progressing toward goals    Frequency  Min 3X/week    PT Plan Current plan remains appropriate    Co-evaluation             End of Session Equipment Utilized During Treatment: Gait belt Activity Tolerance: Patient tolerated treatment well Patient left: in bed;with call bell/phone within reach     Time: 1247-1305  PT Time Calculation (min) (ACUTE ONLY): 18 min  Charges:  $Gait Training: 8-22 mins $Therapeutic Activity: 23-37 mins $Canalith Rep Proc: 8-22 mins $Physical Performance Test: 8-22 mins                    G Codes:      Calinda Stockinger 2016-05-07, 1:38 PM Pager [phone]

## 2016-07-07 DEATH — deceased

## 2018-03-30 IMAGING — CT CT HEAD W/O CM
3 of 4 series · 17 of 47 positions shown, 20 images · non-contrast
Comparison: Head CT dated 01/18/2016

CLINICAL DATA: 54-year-old male with fall

EXAM:
CT HEAD WITHOUT CONTRAST
TECHNIQUE: Contiguous axial images were obtained from the base of the skull
through the vertex without intravenous contrast.

[Series 201: head w/o, idose (1) · axial · non-contrast · 0.43mm/px · z∈[+809,+939]mm · 11 of 32 slices shown, 14 images]
[im 3/32  brain]
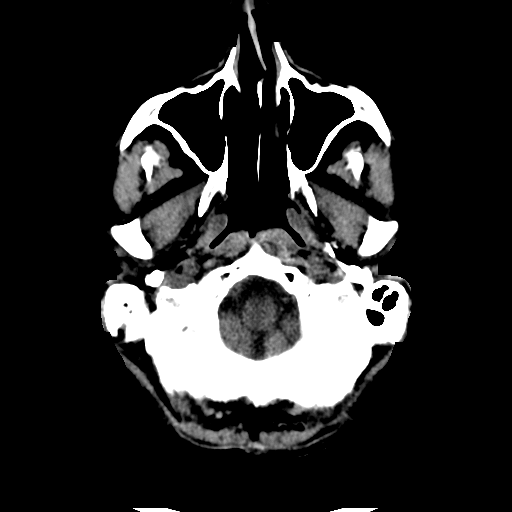
[im 3/32  bone]
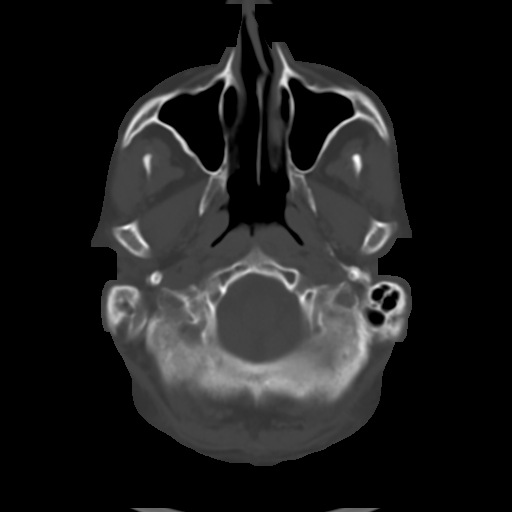
[im 5/32  brain]
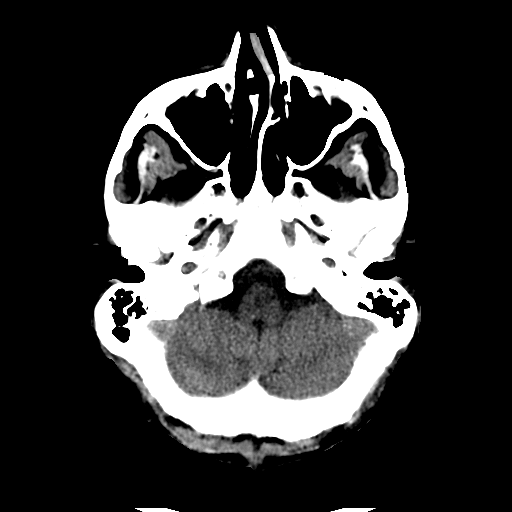
[im 7/32  brain]
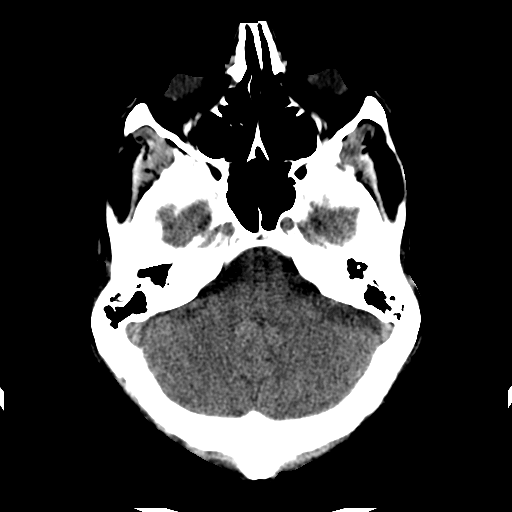
[im 12/32  brain]
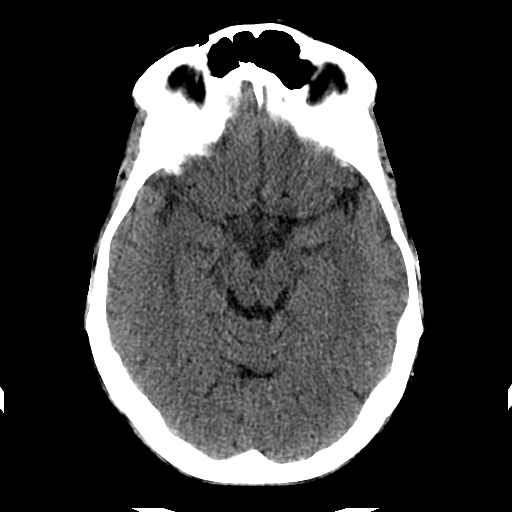
[im 14/32  brain]
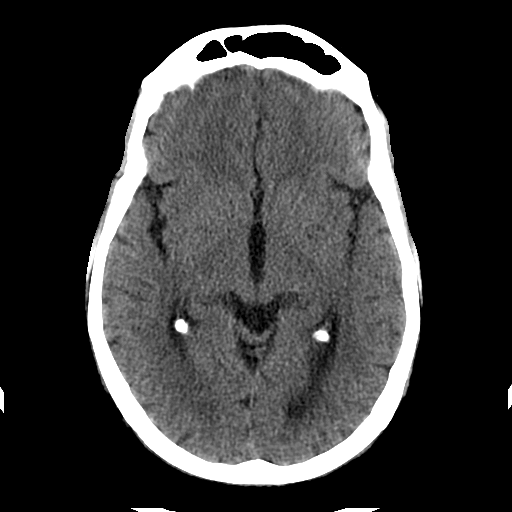
[im 14/32  bone]
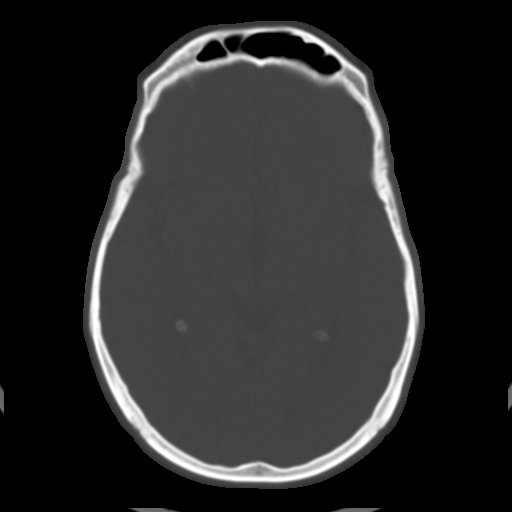
[im 16/32  brain]
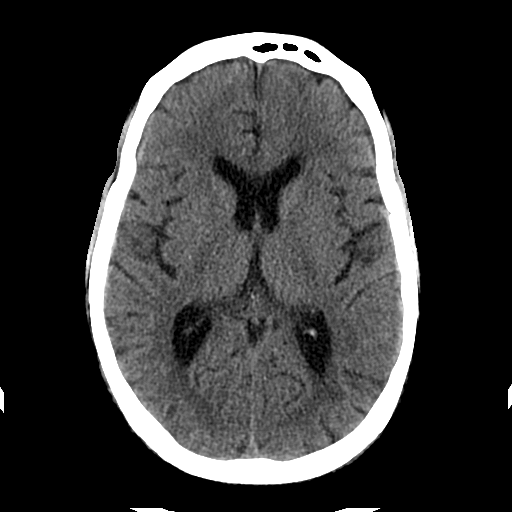
[im 18/32  brain]
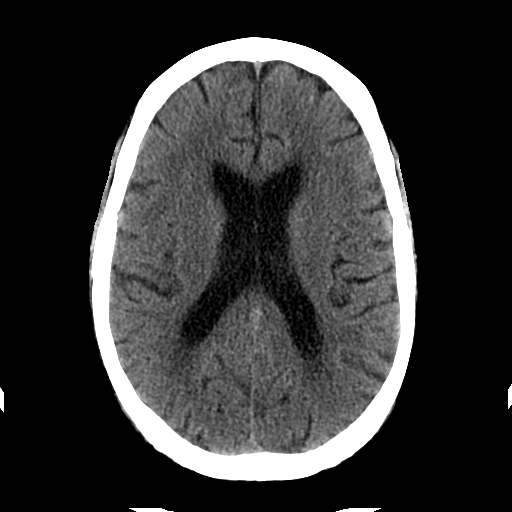
[im 20/32  brain]
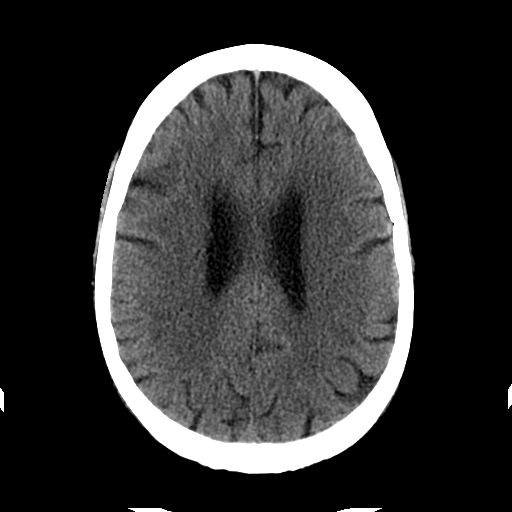
[im 25/32  brain]
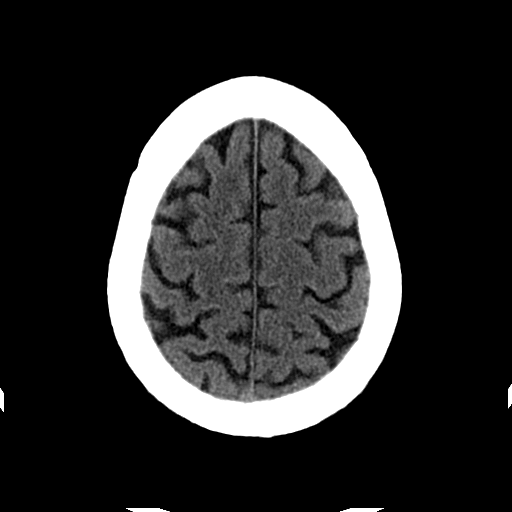
[im 25/32  bone]
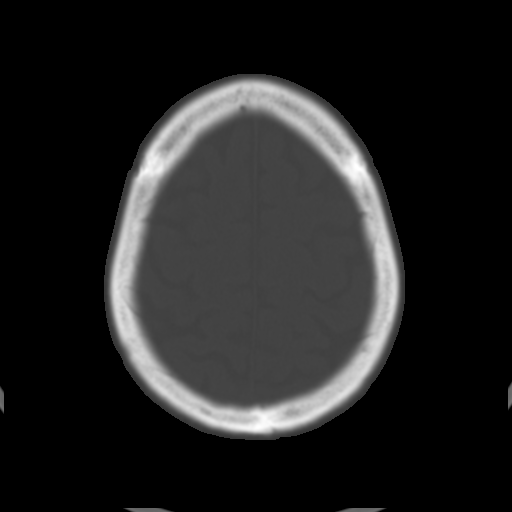
[im 27/32  brain]
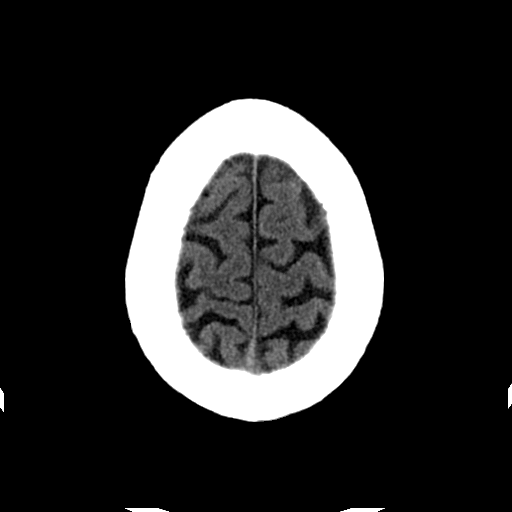
[im 29/32  brain]
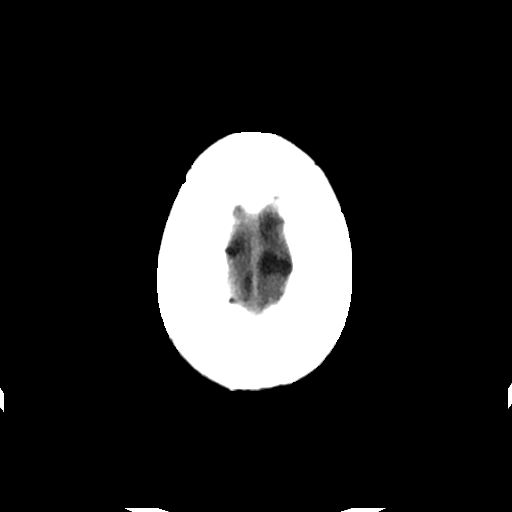

[Series 203: coronal st, idose (1) · coronal · 0.40mm/px · 3 of 72 slices shown]
[im 24/72  brain]
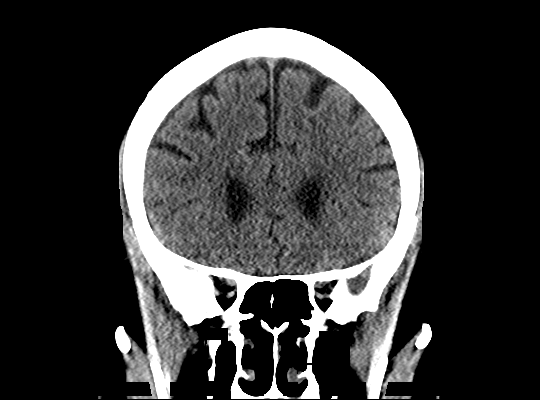
[im 32/72  brain]
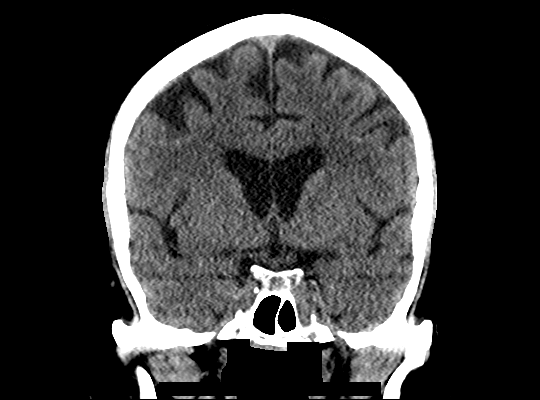
[im 40/72  brain]
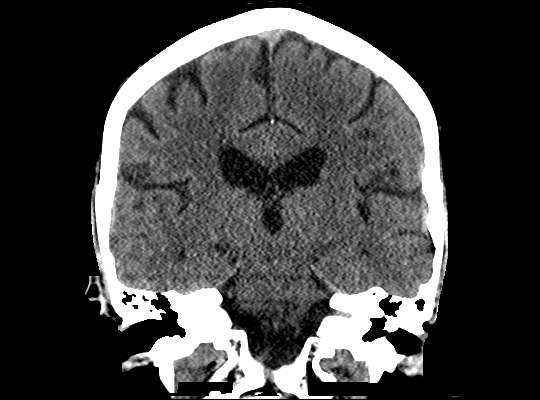

[Series 204: sagittal st, idose (1) · sagittal · 0.40mm/px · 3 of 72 slices shown]
[im 24/72  brain]
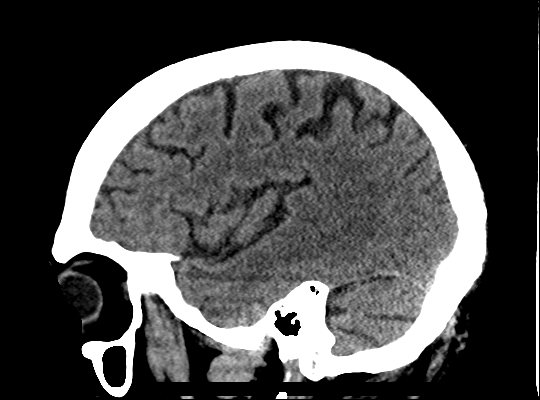
[im 36/72  brain]
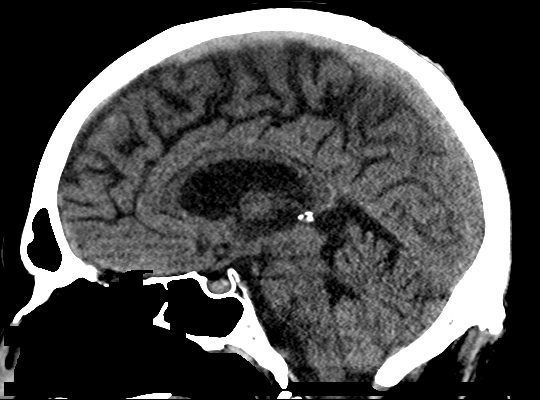
[im 48/72  brain]
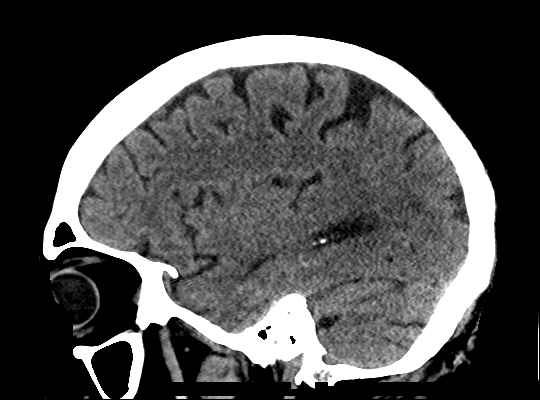

[17 of 47 positions shown; findings below may reference images not displayed]

FINDINGS: There is stable mild prominence of the ventricles and sulci
compatible mild age-related atrophy. Minimal periventricular and
deep white matter chronic microvascular ischemic changes noted.
There is no acute intracranial hemorrhage. No mass effect or midline
shift

The visualized paranasal sinuses and mastoid air cells are clear.
The calvarium is intact.
IMPRESSION: No acute intracranial pathology.
